# Patient Record
Sex: Male | Born: 1950 | Race: White | Hispanic: No | Marital: Married | Smoking: Former smoker
Health system: Southern US, Community
[De-identification: ages and names within clinical notes are randomized; demographics above are authoritative.]

## PROBLEM LIST (undated history)

## (undated) DIAGNOSIS — I447 Left bundle-branch block, unspecified: Secondary | ICD-10-CM

## (undated) DIAGNOSIS — J61 Pneumoconiosis due to asbestos and other mineral fibers: Secondary | ICD-10-CM

## (undated) DIAGNOSIS — E7800 Pure hypercholesterolemia, unspecified: Secondary | ICD-10-CM

## (undated) DIAGNOSIS — IMO0002 Reserved for concepts with insufficient information to code with codable children: Secondary | ICD-10-CM

## (undated) DIAGNOSIS — I10 Essential (primary) hypertension: Secondary | ICD-10-CM

## (undated) DIAGNOSIS — R0602 Shortness of breath: Secondary | ICD-10-CM

## (undated) DIAGNOSIS — K219 Gastro-esophageal reflux disease without esophagitis: Secondary | ICD-10-CM

## (undated) DIAGNOSIS — M1710 Unilateral primary osteoarthritis, unspecified knee: Secondary | ICD-10-CM

## (undated) DIAGNOSIS — R0609 Other forms of dyspnea: Secondary | ICD-10-CM

## (undated) DIAGNOSIS — Z77090 Contact with and (suspected) exposure to asbestos: Secondary | ICD-10-CM

## (undated) DIAGNOSIS — M1990 Unspecified osteoarthritis, unspecified site: Secondary | ICD-10-CM

## (undated) DIAGNOSIS — C4491 Basal cell carcinoma of skin, unspecified: Secondary | ICD-10-CM

## (undated) DIAGNOSIS — I5043 Acute on chronic combined systolic (congestive) and diastolic (congestive) heart failure: Secondary | ICD-10-CM

## (undated) DIAGNOSIS — I509 Heart failure, unspecified: Secondary | ICD-10-CM

## (undated) HISTORY — PX: JOINT REPLACEMENT: SHX530

## (undated) HISTORY — DX: Left bundle-branch block, unspecified: I44.7

## (undated) HISTORY — DX: Heart failure, unspecified: I50.9

## (undated) HISTORY — DX: Acute on chronic combined systolic (congestive) and diastolic (congestive) heart failure: I50.43

## (undated) HISTORY — DX: Other forms of dyspnea: R06.09

## (undated) HISTORY — PX: COLONOSCOPY: SHX174

## (undated) HISTORY — DX: Contact with and (suspected) exposure to asbestos: Z77.090

---

## 2003-06-04 ENCOUNTER — Encounter: Payer: Self-pay | Admitting: Emergency Medicine

## 2006-07-01 ENCOUNTER — Encounter (INDEPENDENT_AMBULATORY_CARE_PROVIDER_SITE_OTHER): Payer: Self-pay | Admitting: Specialist

## 2006-07-01 ENCOUNTER — Ambulatory Visit (HOSPITAL_COMMUNITY): Admission: RE | Admit: 2006-07-01 | Discharge: 2006-07-01 | Payer: Self-pay | Admitting: Gastroenterology

## 2006-07-01 ENCOUNTER — Ambulatory Visit: Payer: Self-pay | Admitting: Gastroenterology

## 2007-08-15 ENCOUNTER — Encounter: Payer: Self-pay | Admitting: Emergency Medicine

## 2007-10-19 HISTORY — PX: FINGER AMPUTATION: SHX636

## 2009-03-13 ENCOUNTER — Encounter: Payer: Self-pay | Admitting: Emergency Medicine

## 2009-05-14 ENCOUNTER — Encounter: Payer: Self-pay | Admitting: Emergency Medicine

## 2010-03-26 ENCOUNTER — Ambulatory Visit: Payer: Self-pay | Admitting: Emergency Medicine

## 2010-03-26 ENCOUNTER — Telehealth (INDEPENDENT_AMBULATORY_CARE_PROVIDER_SITE_OTHER): Payer: Self-pay | Admitting: *Deleted

## 2010-03-26 DIAGNOSIS — Z77090 Contact with and (suspected) exposure to asbestos: Secondary | ICD-10-CM | POA: Insufficient documentation

## 2010-03-26 DIAGNOSIS — Z87891 Personal history of nicotine dependence: Secondary | ICD-10-CM

## 2010-03-31 ENCOUNTER — Encounter: Payer: Self-pay | Admitting: Emergency Medicine

## 2010-03-31 ENCOUNTER — Ambulatory Visit (HOSPITAL_COMMUNITY): Admission: RE | Admit: 2010-03-31 | Discharge: 2010-03-31 | Payer: Self-pay | Admitting: Emergency Medicine

## 2010-04-09 ENCOUNTER — Ambulatory Visit: Payer: Self-pay | Admitting: Emergency Medicine

## 2010-04-09 DIAGNOSIS — Z77090 Contact with and (suspected) exposure to asbestos: Secondary | ICD-10-CM

## 2010-04-09 DIAGNOSIS — J309 Allergic rhinitis, unspecified: Secondary | ICD-10-CM

## 2010-11-08 ENCOUNTER — Encounter: Payer: Self-pay | Admitting: Emergency Medicine

## 2010-11-17 NOTE — Progress Notes (Signed)
Summary: returning a call to lori/cb  Phone Note Call from Patient Call back at [facility] Phone [phone]   Caller: Patient Call For: byrum Summary of Call: [facility] [employer] number [phone] fax      [phone] phone Initial call taken by: Lacinda Axon,  March 26, 2010 2:37 PM  Follow-up for Phone Call        lori gave papers for me to fax to [facility] [employer] ---done and papers returned to [facility] for dr byrum Follow-up by: Philipp Deputy CMA,  March 26, 2010 3:50 PM

## 2010-11-17 NOTE — Letter (Signed)
Summary: [id number] records  1982-2008 records   Imported By: Lester Washburn 05/29/2010 09:25:16  _____________________________________________________________________  External Attachment:    Type:   Image     Comment:   External Document

## 2010-11-17 NOTE — Assessment & Plan Note (Signed)
Summary: asbestos, abnormal CT scan   Visit Type:  Follow-up Copy to:  Self Referred Primary Provider/Referring Provider:  Dr. Nicanor Bake  CC:  Patient here for follow-up to discuss PFT results. No complaints today.[name] Kitchen  History of Present Illness: 60 yo man, former tobacco, has worked for [employer] with documented asbestos exposure. Was discovered to have an abnormal CXR, around 2004. No other symptoms at that time. Over time he has developed some exertional dyspnea. Has never had a CT scan of the chest. He has done spirometry and CXR's   ROV 04/09/10 -- follows up to today to review Ct scan and PFT in setting of asbestos exposure. Other ROS - mentions sinus pain/pressure, nasal congestion, clear drainage.   [place]  Current Medications (verified): 1)  Ibuprofen 200 Mg Tabs (Ibuprofen) .... As Needed 2)  Fish Oil 500 Mg Caps (Omega-3 Fatty Acids) .... Take 1 Capsule By Mouth Once A Day 3)  Multivitamins  Tabs (Multiple Vitamin) .... Take 1 Tablet By Mouth Once A Day 4)  Glucosamine Chondr 1500 Complx  Caps (Glucosamine-Chondroit-Vit C-Mn) .... 2 Caps Once Daily  Allergies (verified): 1)  Pcn  Vital Signs:  Patient profile:   60 year old male Height:      73.5 inches (186.69 cm) Weight:      246 pounds (111.82 kg) BMI:     32.13 O2 Sat:      92 % on Room air Temp:     97.8 degrees F (36.56 degrees C) oral Pulse rate:   86 / minute BP sitting:   116 / 80  (left arm) Cuff size:   regular  Vitals Entered By: Michel Bickers CMA (April 09, 2010 3:50 PM)  O2 Sat at Rest %:  92 O2 Flow:  Room air CC: Patient here for follow-up to discuss PFT results. No complaints today. Comments Medications reviewed. Daytime phone verified. Michel Bickers [facility]  April 09, 2010 4:03 PM   Physical Exam  General:  normal appearance and healthy appearing.   Head:  normocephalic and atraumatic Nose:  no deformity, discharge, inflammation, or lesions Mouth:  no deformity or lesions Neck:  no masses,  thyromegaly, or abnormal cervical nodes Lungs:  clear bilaterally to auscultation Heart:  regular rate and rhythm, S1, S2 without murmurs, rubs, gallops, or clicks Abdomen:  not examined Msk:  no deformity or scoliosis noted with normal posture Extremities:  no clubbing, cyanosis, edema, or deformity noted Neurologic:  non-focal Skin:  intact without lesions or rashes Psych:  alert and cooperative; normal mood and affect; normal attention span and concentration   Pulmonary Function Test Date: 03/31/2010 Height (in.): 73.50 Gender: Male  Pre-Spirometry FVC    Value: 4.98 L/min   Pred: 5.38 L/min     % Pred: 92 % FEV1    Value: 3.68 L     Pred: 4.07 L     % Pred: 90 % FEV1/FVC  Value: 74 %     Pred: 76 %     % Pred: 97 % FEF 25-75  Value: 2.66 L/min   Pred: 3.32 L/min     % Pred: 80 %  Post-Spirometry FVC    Value: 5.02 L/min   Pred: 4.98 L/min     % Pred: 93 % FEV1    Value: 3.77 L     Pred: 3.68 L     % Pred: 92 % FEV1/FVC  Value: 75 %     Pred: 74 %     % Pred:  98 % FEF 25-75  Value: 2.82 L/min   Pred: 2.66 L/min     % Pred: 84 %  Lung Volumes TLC    Value: 8.02 L   % Pred: 104 % RV    Value: 2.92 L   % Pred: 119 % DLCO    Value: 32.23 %   % Pred: 86 % DLCO/VA  Value: 4.27 %   % Pred: 88 %  Impression & Recommendations:  Problem # 1:  ASBESTOS EXPOSURE, 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  - will need to follow imaging and PFT monthly  Problem # 2:  CT, CHEST, ABNORMAL (ICD-793.1) Nodular disease noted, non-specific finding but will need to be followed.  - repeat CT annually  Problem # 3:  ALLERGIC RHINITIS (ICD-477.9)  Other Orders: Est. Patient Level IV (69629)  Patient Instructions: 1)  Your CT scan shows some mild nodular changes. These should be followed with a scan in a year to insure that they are not changing. 2)  Your lung function testing is stable compared with your last testing in 2008. The lung capacity and  airflows are normal.  3)  Try using your claritin every day when you are having nasal obstruction, congestion and drainage.  4)  You may also try OTC decongestants that contain either chlorphenerimine or bromphenerimine.  5)  Try using veramyst 2 sprays each nostril once daily for nasal congestion.  6)  Follow up with Dr Delton Coombes in 1 year or sooner if you have any problems.

## 2010-11-17 NOTE — Assessment & Plan Note (Signed)
Summary: asbestos exposure   Visit Type:  Initial Consult Copy to:  Self Referred Primary Provider/Referring Provider:  Dr. Nicanor Bake  CC:  Pulmonary Consult for asbestosis exposure and abnormal CXR.  History of Present Illness: 60 yo man, former tobacco, has worked for [employer] with documented asbestos exposure. Was discovered to have an abnormal CXR, around 2004. No other symptoms at that time. Over time he has developed some exertional dyspnea. Has never had a CT scan of the chest. He has done spirometry and CXR's   Preventive Screening-Counseling & Management  Alcohol-Tobacco     Smoking Status: quit  Current Medications (verified): 1)  Ibuprofen 200 Mg Tabs (Ibuprofen) .... As Needed 2)  Fish Oil 500 Mg Caps (Omega-3 Fatty Acids) .... Take 1 Capsule By Mouth Once A Day 3)  Multivitamins  Tabs (Multiple Vitamin) .... Take 1 Tablet By Mouth Once A Day 4)  Glucosamine Chondr 1500 Complx  Caps (Glucosamine-Chondroit-Vit C-Mn) .... 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  Quit 1987. 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  The patient denies shortness of breath with activity, shortness of breath at rest, productive cough, non-productive cough, coughing up blood, chest pain, irregular heartbeats, acid heartburn, indigestion, loss of appetite, weight change, abdominal pain, difficulty swallowing, sore throat, tooth/dental problems, headaches, nasal congestion/difficulty breathing through nose, sneezing,  itching, ear ache, anxiety, depression, hand/feet swelling, rash, change in color of mucus, and fever.    Vital Signs:  Patient profile:   60 year old male Height:      74 inches Weight:      242 pounds BMI:     31.18 O2 Sat:      91 % on Room air Temp:     98.3 degrees F oral Pulse rate:   90 / minute BP sitting:   118 / 80  (left arm) Cuff size:   regular  Vitals Entered By: Gweneth Dimitri RN (March 26, 2010 11:26 AM)  O2 Flow:  Room air CC: Pulmonary Consult for asbestosis exposure and abnormal CXR Comments Medications reviewed with patient Daytime contact number verified with patient. Gweneth Dimitri RN  March 26, 2010 11:26 AM    Physical Exam  General:  normal appearance and healthy appearing.     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  - high res CT - full PFT - obtain records from prior evals for comparison - ROV to review. - will need serial exams and CXR's  Problem # 2:  TOBACCO ABUSE, HX OF (ICD-V15.82)  Medications Added to Medication List This Visit: 1)  Ibuprofen 200 Mg Tabs (Ibuprofen) .... As needed 2)  Fish Oil 500 Mg Caps (Omega-3 fatty acids) .... Take 1 capsule by mouth once a day 3)  Multivitamins Tabs (Multiple vitamin) .... Take 1 tablet by mouth once a day 4)  Glucosamine Chondr 1500 Complx Caps (Glucosamine-chondroit-vit c-mn) .... 2 caps once daily  Other Orders: Consultation Level IV (16109)  Radiology Referral (Radiology)  Patient Instructions: 1)  We will perform a high resolution CT scan of the chest 2)  We will perform full pulmonary function testing 3)  We will obtain copies of your prior imaging and PFT's 4)  Follow up with Dr Delton Coombes after your testing is completed to review the results.   Appended Document: asbestos exposure Will review with pt at his ROV. RSB

## 2010-11-17 NOTE — Letter (Signed)
Summary: [facility]   Imported By: Sherian Rein 05/01/2010 07:54:47  _____________________________________________________________________  External Attachment:    Type:   Image     Comment:   External Document

## 2011-03-05 NOTE — Op Note (Signed)
NAME:  Franklin Jones, Franklin Jones NO.:  [id number]   MEDICAL RECORD NO.:  [id number]          PATIENT TYPE:  AMB   LOCATION:  DAY                           FACILITY:  [facility]   PHYSICIAN:  Kassie Mends, M.D.      DATE OF BIRTH:  [birth year]   DATE OF PROCEDURE:  07/01/2006  DATE OF DISCHARGE:  07/01/2006                                 OPERATIVE REPORT   DATE OF PROCEDURE:  07/01/2006   PROCEDURE:  Colonoscopy with cold forceps polypectomy.   FINDINGS:  1. 4-mm rectal polyp removed via cold forceps.  2. Rare sigmoid diverticulosis and diverticula; otherwise no masses,      inflammatory changes or vascular ectasia seen.  No internal      hemorrhoids.   RECOMMENDATIONS:  1. High fiber diet.  Handout given on polyps, diverticulosis, and high      fiber diet.  2. Follow-up biopsies.  If adenomatous, then next screening colonoscopy in      5 years.  3. Follow with Dr. Doreen Beam.   MEDICATIONS:  1. Demerol 50 mg IV.  2. Versed 4 mg IV.   PROCEDURE TECHNIQUE:  Physical exam was performed and informed consent was  obtained from the patient explaining all the benefits, risks and  alternatives to the procedure.  The patient was connected to the monitor and  placed in the left lateral position.  Continuous oxygen was provided by  nasal cannula IV medicine administered through an indwelling cannula.  After  administration of sedation and rectal exam, the patient's rectum was  intubated.  The scope was advanced under direct visualization to the cecum.  The scope was subsequently removed slowly by carefully examine the color,  texture, anatomy integrity of mucosa on the way out.  The patient was  recovered in the endoscopy suite and discharged home in satisfactory  condition.      Kassie Mends, M.D.  Electronically Signed    SM/MEDQ  D:  07/01/2006  T:  07/03/2006  Job:  [id number]   cc:   Doreen Beam  Fax: [phone]

## 2011-04-05 ENCOUNTER — Telehealth: Payer: Self-pay | Admitting: Emergency Medicine

## 2011-04-05 DIAGNOSIS — Z77090 Contact with and (suspected) exposure to asbestos: Secondary | ICD-10-CM

## 2011-04-05 NOTE — Telephone Encounter (Signed)
Spoke with pt. He is asking to go ahead and have CT Chest and PFT sched since he is due for these before his rov sched for 04/22/11. I looked back and per last ov note he was to have CT Chest done annually, but I am unsure about the PFT. RB, pls advise if both tests are needed and if CT is w/ or w/o contrast. Thanks!

## 2011-04-06 NOTE — Telephone Encounter (Signed)
He needs spirometry only, needs to be done annually. I will order now and he can get it before our ROV.

## 2011-04-06 NOTE — Telephone Encounter (Signed)
Addended by: Leslye Peer on: 04/06/2011 09:56 AM   Modules accepted: Orders

## 2011-04-07 ENCOUNTER — Telehealth: Payer: Self-pay | Admitting: Emergency Medicine

## 2011-04-07 NOTE — Telephone Encounter (Signed)
[id number]

## 2011-04-07 NOTE — Telephone Encounter (Signed)
This was signed by accident.  Will hold in triage until pt returns call.

## 2011-04-22 ENCOUNTER — Ambulatory Visit: Payer: Self-pay | Admitting: Emergency Medicine

## 2011-04-22 ENCOUNTER — Telehealth: Payer: Self-pay | Admitting: Emergency Medicine

## 2011-04-22 DIAGNOSIS — Z77090 Contact with and (suspected) exposure to asbestos: Secondary | ICD-10-CM

## 2011-04-22 DIAGNOSIS — R930 Abnormal findings on diagnostic imaging of skull and head, not elsewhere classified: Secondary | ICD-10-CM

## 2011-04-23 NOTE — Telephone Encounter (Signed)
[id number]

## 2011-04-26 NOTE — Telephone Encounter (Signed)
I spoke with the pt and he states that he is supposed to have a CT scan repeated this year. He states he has an appt this Wed. At 4pm. He states he could do the CT on Wed. Per last OV note RB wanted to repaet CT in 1 year. Order placed and sent to [facility]. Carron Curie, CMA

## 2011-04-26 NOTE — Telephone Encounter (Signed)
PATIENT CALLED BACK AND WAITED ON HOLD FOR SOME TIME.  PLEASE CALL BACK AT [phone] AND HAVE HIM PAGED.

## 2011-04-28 ENCOUNTER — Ambulatory Visit (INDEPENDENT_AMBULATORY_CARE_PROVIDER_SITE_OTHER): Payer: Worker's Compensation | Admitting: Emergency Medicine

## 2011-04-28 ENCOUNTER — Ambulatory Visit (INDEPENDENT_AMBULATORY_CARE_PROVIDER_SITE_OTHER)
Admission: RE | Admit: 2011-04-28 | Discharge: 2011-04-28 | Disposition: A | Discharge status: Home / Self Care | Payer: Self-pay | Source: Ambulatory Visit | Attending: Emergency Medicine | Admitting: Emergency Medicine

## 2011-04-28 ENCOUNTER — Encounter: Payer: Self-pay | Admitting: Emergency Medicine

## 2011-04-28 VITALS — BP 104/70 | HR 94 | Temp 98.1°F | Ht 74.0 in | Wt 251.0 lb

## 2011-04-28 DIAGNOSIS — Z77090 Contact with and (suspected) exposure to asbestos: Secondary | ICD-10-CM

## 2011-04-28 DIAGNOSIS — R930 Abnormal findings on diagnostic imaging of skull and head, not elsewhere classified: Secondary | ICD-10-CM

## 2011-04-28 LAB — PULMONARY FUNCTION TEST

## 2011-04-28 NOTE — Progress Notes (Signed)
60 yo man, former tobacco, has worked for [employer] with documented asbestos exposure. Was discovered to have an abnormal CXR, around 2004. No other symptoms at that time. Over time he has developed some exertional dyspnea. Has never had a CT scan of the chest. He has done spirometry and CXR's   ROV 04/09/10 -- follows up to today to review Ct scan and PFT in setting of asbestos exposure. Other ROS - mentions sinus pain/pressure, nasal congestion, clear drainage.   ROV 04/28/11 -- follow up visit, hx asbestos exposure. Had PFT and CT scan today. 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 PFT as below. He has noticed a slight decrease in functional capacity - related this to knee pain and decreased activity level, decreased conditioning.    Gen: Pleasant, well-nourished, in no distress,  normal affect  ENT: No lesions,  mouth clear,  oropharynx clear, no postnasal drip  Neck: No JVD, no TMG, no carotid bruits  Lungs: No use of accessory muscles, no dullness to percussion, clear without rales or rhonchi  Cardiovascular: RRR, heart sounds normal, no murmur or gallops, no peripheral edema  Musculoskeletal: No deformities, no cyanosis or clubbing  Neuro: alert, non focal  Skin: Warm, no lesions or rashes   PULMONARY FUNCTON TEST 04/09/2010 04/28/2011  FVC 4.98 4.51  FEV1 3.68 3.2  FEV1/FVC 73.9 71  FVC  % Predicted 92 87  FEV % Predicted 90 88  FeF 25-75 2.66 2.01  FeF 25-75 % Predicted 3.32 3.29    CT CHEST WITHOUT CONTRAST  04/26/11  Technique: Multidetector CT imaging of the chest was performed  following the standard protocol without IV contrast.  Comparison: 03/31/2010.  Findings: No pathologically enlarged mediastinal or axillary lymph  nodes. Hilar regions are difficult to definitively evaluate  without IV contrast. Coronary artery calcification. Heart size  normal. No pericardial effusion.  No pleural plaques. Subpleural scarring is seen in the medial  right lower lobe,  adjacent to prominent osteophytes in the thoracic  spine. Subpleural lymph node along the minor fissure. Scattered  tiny pulmonary nodules measure 4 mm or less in size and are  unchanged in the 62-month interval from 03/31/2010. No known  subpleural reticulation or honeycombing. No pleural fluid. Airway  is unremarkable.  Incidental imaging of the upper abdomen shows a 12 mm low  attenuation lesion in the left hepatic lobe, stable. No worrisome  lytic or sclerotic lesions.  IMPRESSION:  1. No evidence of asbestos related pleural disease.  2. Scattered pulmonary nodules measure 4 mm or less in size, are  unchanged in a 78-month interval from 03/31/2010, and are therefore  considered benign.  ASSESSMENT/PLAN: ASBESTOS EXPOSURE, HX OF Has some micronodular disease, mild bronchiectatic change but no plaques.PFT are in normal range but there is a drop in FEV1 and FVC today compared with 2011. ? A significant change. He feels well - will trend the PFT's again in a year or sooner if he clinically changes. CXR next visit.

## 2011-04-28 NOTE — Assessment & Plan Note (Signed)
Has some micronodular disease, mild bronchiectatic change but no plaques.PFT are in normal range but there is a drop in FEV1 and FVC today compared with 2011. ? A significant change. He feels well - will trend the PFT's again in a year or sooner if he clinically changes. CXR next visit.

## 2011-04-28 NOTE — Progress Notes (Signed)
PFT done today. 

## 2011-04-28 NOTE — Patient Instructions (Signed)
We will repeat your pulmonary function testing in a year.  We will perform a CXR in a year.  Follow up with Dr Delton Coombes in a year to review your testing. If you have any problems with your breathing please follow up sooner.

## 2011-10-27 ENCOUNTER — Other Ambulatory Visit (HOSPITAL_COMMUNITY): Payer: Self-pay | Admitting: Orthopaedic Surgery

## 2011-11-08 ENCOUNTER — Encounter (HOSPITAL_COMMUNITY): Payer: Self-pay | Admitting: Pharmacy Technician

## 2011-11-17 ENCOUNTER — Encounter (HOSPITAL_COMMUNITY)
Admission: RE | Admit: 2011-11-17 | Discharge: 2011-11-17 | Disposition: A | Discharge status: Home / Self Care | Payer: 59 | Source: Ambulatory Visit | Attending: Orthopaedic Surgery | Admitting: Orthopaedic Surgery

## 2011-11-17 ENCOUNTER — Encounter (HOSPITAL_COMMUNITY): Payer: Self-pay

## 2011-11-17 ENCOUNTER — Ambulatory Visit (HOSPITAL_COMMUNITY)
Admission: RE | Admit: 2011-11-17 | Discharge: 2011-11-17 | Disposition: A | Discharge status: Home / Self Care | Payer: 59 | Source: Ambulatory Visit | Attending: Orthopaedic Surgery | Admitting: Orthopaedic Surgery

## 2011-11-17 ENCOUNTER — Other Ambulatory Visit: Payer: Self-pay

## 2011-11-17 DIAGNOSIS — M47814 Spondylosis without myelopathy or radiculopathy, thoracic region: Secondary | ICD-10-CM | POA: Insufficient documentation

## 2011-11-17 DIAGNOSIS — Z01818 Encounter for other preprocedural examination: Secondary | ICD-10-CM | POA: Insufficient documentation

## 2011-11-17 DIAGNOSIS — Z01812 Encounter for preprocedural laboratory examination: Secondary | ICD-10-CM | POA: Insufficient documentation

## 2011-11-17 HISTORY — DX: Unspecified osteoarthritis, unspecified site: M19.90

## 2011-11-17 LAB — CBC
Hemoglobin: 14.5 g/dL (ref 13.0–17.0)
MCH: 30.5 pg (ref 26.0–34.0)
MCV: 88.7 fL (ref 78.0–100.0)
Platelets: 217 10*3/uL (ref 150–400)
WBC: 7 10*3/uL (ref 4.0–10.5)

## 2011-11-17 LAB — BASIC METABOLIC PANEL
BUN: 24 mg/dL — ABNORMAL HIGH (ref 6–23)
Calcium: 9.9 mg/dL (ref 8.4–10.5)
Chloride: 101 mEq/L (ref 96–112)
GFR calc non Af Amer: 56 mL/min — ABNORMAL LOW (ref >90)
Glucose, Bld: 77 mg/dL (ref 70–99)
Sodium: 135 mEq/L (ref 135–145)

## 2011-11-17 LAB — PROTIME-INR: INR: 0.97 (ref 0.00–1.49)

## 2011-11-17 LAB — URINALYSIS, ROUTINE W REFLEX MICROSCOPIC
Nitrite: NEGATIVE
Protein, ur: NEGATIVE mg/dL
Specific Gravity, Urine: 1.027 (ref 1.005–1.030)
Urobilinogen, UA: 0.2 mg/dL (ref 0.0–1.0)
pH: 5.5 (ref 5.0–8.0)

## 2011-11-17 LAB — APTT: aPTT: 33 seconds (ref 24–37)

## 2011-11-17 NOTE — Patient Instructions (Signed)
20 [name]  11/17/2011   Your procedure is scheduled on: 11/19/11   Friday 0930-1100  Report to [facility] at 0700 AM.  Call this number if you have problems the morning of surgery: [phone]   [facility]  PST [phone]   Remember:   Do not eat food:After Midnight.Thursday night  May have clear liquids:until Midnight .Thursday night  Clear liquids include soda, tea, black coffee, apple or grape juice, broth.  Take these medicines the morning of surgery with A SIP OF WATER:none  Do not wear jewelry, make-up or nail polish.  Do not wear lotions, powders, or perfumes. You may wear deodorant.  Do not shave 48 hours prior to surgery.  Do not bring valuables to the hospital.  Contacts, dentures or bridgework may not be worn into surgery.  Leave suitcase in the car. After surgery it may be brought to your room.  For patients admitted to the hospital, checkout time is 11:00 AM the day of discharge.   Patients discharged the day of surgery will not be allowed to drive home.  Name and phone number of your driver: wife  Special Instructions: CHG Shower Use Special Wash: 1/2 bottle night before surgery and 1/2 bottle morning of surgery. Regular soap face and privates   Please read over the following fact sheets that you were given: MRSA Information

## 2011-11-17 NOTE — Pre-Procedure Instructions (Signed)
Faxed note to Dr Magnus Ivan regarding "cold" and has been on antibiotics with confirmation. Faxed request to Dr Magnus Ivan for review abnormal BUN and urinalysis with confirmation

## 2011-11-19 ENCOUNTER — Encounter (HOSPITAL_COMMUNITY): Payer: Self-pay | Admitting: Anesthesiology

## 2011-11-19 ENCOUNTER — Inpatient Hospital Stay (HOSPITAL_COMMUNITY)
Admission: RE | Admit: 2011-11-19 | Discharge: 2011-11-22 | DRG: 470 | Disposition: A | Discharge status: Home Health | Payer: 59 | Source: Ambulatory Visit | Attending: Orthopaedic Surgery | Admitting: Orthopaedic Surgery

## 2011-11-19 ENCOUNTER — Inpatient Hospital Stay (HOSPITAL_COMMUNITY): Payer: 59 | Admitting: Anesthesiology

## 2011-11-19 ENCOUNTER — Inpatient Hospital Stay (HOSPITAL_COMMUNITY): Payer: 59

## 2011-11-19 ENCOUNTER — Encounter (HOSPITAL_COMMUNITY)
Admission: RE | Disposition: A | Discharge status: Home Health | Payer: Self-pay | Source: Ambulatory Visit | Attending: Orthopaedic Surgery

## 2011-11-19 ENCOUNTER — Encounter (HOSPITAL_COMMUNITY): Payer: Self-pay | Admitting: *Deleted

## 2011-11-19 DIAGNOSIS — M1710 Unilateral primary osteoarthritis, unspecified knee: Principal | ICD-10-CM | POA: Diagnosis present

## 2011-11-19 DIAGNOSIS — M179 Osteoarthritis of knee, unspecified: Secondary | ICD-10-CM

## 2011-11-19 HISTORY — DX: Unilateral primary osteoarthritis, unspecified knee: M17.10

## 2011-11-19 HISTORY — PX: KNEE ARTHROPLASTY: SHX992

## 2011-11-19 HISTORY — DX: Osteoarthritis of knee, unspecified: M17.9

## 2011-11-19 LAB — TYPE AND SCREEN

## 2011-11-19 SURGERY — ARTHROPLASTY, KNEE, TOTAL, USING IMAGELESS COMPUTER-ASSISTED NAVIGATION
Anesthesia: General | Site: Knee | Laterality: Right | Wound class: Clean

## 2011-11-19 MED ORDER — MENTHOL 3 MG MT LOZG
1.0000 | LOZENGE | OROMUCOSAL | Status: DC | PRN
  Filled 2011-11-19: qty 9

## 2011-11-19 MED ORDER — FENTANYL CITRATE 0.05 MG/ML IJ SOLN
50.0000 ug | INTRAMUSCULAR | Status: DC | PRN
  Administered 2011-11-19: 100 ug via INTRAVENOUS

## 2011-11-19 MED ORDER — CLINDAMYCIN PHOSPHATE 600 MG/50ML IV SOLN
600.0000 mg | INTRAVENOUS | Status: AC
  Administered 2011-11-19: 600 mg via INTRAVENOUS

## 2011-11-19 MED ORDER — ROPIVACAINE HCL 5 MG/ML IJ SOLN
INTRAMUSCULAR | Status: DC | PRN
  Administered 2011-11-19: 30 mL

## 2011-11-19 MED ORDER — HYDROMORPHONE HCL PF 1 MG/ML IJ SOLN
INTRAMUSCULAR | Status: DC | PRN
  Administered 2011-11-19: 1 mg via INTRAVENOUS
  Administered 2011-11-19 (×2): 0.5 mg via INTRAVENOUS

## 2011-11-19 MED ORDER — ACETAMINOPHEN 325 MG PO TABS: 650.0000 mg | ORAL_TABLET | Freq: Four times a day (QID) | ORAL | Status: DC | PRN

## 2011-11-19 MED ORDER — DOCUSATE SODIUM 100 MG PO CAPS
100.0000 mg | ORAL_CAPSULE | Freq: Two times a day (BID) | ORAL | Status: DC
  Administered 2011-11-20 – 2011-11-22 (×5): 100 mg via ORAL
  Filled 2011-11-19 (×7): qty 1

## 2011-11-19 MED ORDER — DIPHENHYDRAMINE HCL 12.5 MG/5ML PO ELIX
12.5000 mg | ORAL_SOLUTION | Freq: Four times a day (QID) | ORAL | Status: DC | PRN
  Filled 2011-11-19: qty 5

## 2011-11-19 MED ORDER — FERROUS SULFATE 325 (65 FE) MG PO TABS
325.0000 mg | ORAL_TABLET | Freq: Three times a day (TID) | ORAL | Status: DC
  Administered 2011-11-20 – 2011-11-22 (×7): 325 mg via ORAL
  Filled 2011-11-19 (×10): qty 1

## 2011-11-19 MED ORDER — SODIUM CHLORIDE 0.9 % IJ SOLN: 9.0000 mL | INTRAMUSCULAR | Status: DC | PRN

## 2011-11-19 MED ORDER — ACETAMINOPHEN 650 MG RE SUPP: 650.0000 mg | Freq: Four times a day (QID) | RECTAL | Status: DC | PRN

## 2011-11-19 MED ORDER — NEOSTIGMINE METHYLSULFATE 1 MG/ML IJ SOLN
INTRAMUSCULAR | Status: DC | PRN
  Administered 2011-11-19: 5 mg via INTRAVENOUS

## 2011-11-19 MED ORDER — ONDANSETRON HCL 4 MG PO TABS: 4.0000 mg | ORAL_TABLET | Freq: Four times a day (QID) | ORAL | Status: DC | PRN

## 2011-11-19 MED ORDER — DIPHENHYDRAMINE HCL 12.5 MG/5ML PO ELIX: 12.5000 mg | ORAL_SOLUTION | ORAL | Status: DC | PRN

## 2011-11-19 MED ORDER — ADULT MULTIVITAMIN W/MINERALS CH
1.0000 | ORAL_TABLET | Freq: Every day | ORAL | Status: DC
  Administered 2011-11-20 – 2011-11-22 (×3): 1 via ORAL
  Filled 2011-11-19 (×4): qty 1

## 2011-11-19 MED ORDER — EPHEDRINE SULFATE 50 MG/ML IJ SOLN
INTRAMUSCULAR | Status: DC | PRN
  Administered 2011-11-19 (×3): 10 mg via INTRAVENOUS

## 2011-11-19 MED ORDER — METHOCARBAMOL 500 MG PO TABS
500.0000 mg | ORAL_TABLET | Freq: Four times a day (QID) | ORAL | Status: DC | PRN
Start: 2011-11-19 — End: 2011-11-22
  Administered 2011-11-19 – 2011-11-20 (×4): 500 mg via ORAL
  Filled 2011-11-19 (×4): qty 1

## 2011-11-19 MED ORDER — SODIUM CHLORIDE 0.9 % IV SOLN
INTRAVENOUS | Status: DC
  Administered 2011-11-19 – 2011-11-20 (×2): via INTRAVENOUS

## 2011-11-19 MED ORDER — METHOCARBAMOL 100 MG/ML IJ SOLN
500.0000 mg | Freq: Four times a day (QID) | INTRAMUSCULAR | Status: DC | PRN
  Administered 2011-11-19: 500 mg via INTRAVENOUS
  Filled 2011-11-19: qty 5

## 2011-11-19 MED ORDER — FENTANYL CITRATE 0.05 MG/ML IJ SOLN
INTRAMUSCULAR | Status: DC | PRN
  Administered 2011-11-19 (×4): 25 ug via INTRAVENOUS
  Administered 2011-11-19: 50 ug via INTRAVENOUS
  Administered 2011-11-19 (×2): 25 ug via INTRAVENOUS

## 2011-11-19 MED ORDER — RIVAROXABAN 10 MG PO TABS
10.0000 mg | ORAL_TABLET | Freq: Every day | ORAL | Status: DC
  Administered 2011-11-20 – 2011-11-22 (×3): 10 mg via ORAL
  Filled 2011-11-19 (×3): qty 1

## 2011-11-19 MED ORDER — MORPHINE SULFATE (PF) 1 MG/ML IV SOLN
INTRAVENOUS | Status: DC
  Administered 2011-11-19: 14:00:00 via INTRAVENOUS
  Administered 2011-11-19: 5 mg via INTRAVENOUS
  Administered 2011-11-19: 8 mg via INTRAVENOUS
  Administered 2011-11-20: 9 mg via INTRAVENOUS
  Administered 2011-11-20: 4 mg via INTRAVENOUS
  Administered 2011-11-20: 9 mg via INTRAVENOUS
  Administered 2011-11-20: 5 mg via INTRAVENOUS
  Administered 2011-11-20: 4 mg via INTRAVENOUS
  Administered 2011-11-20: 8 mg via INTRAVENOUS
  Administered 2011-11-21: 2 mg via INTRAVENOUS
  Administered 2011-11-21: via INTRAVENOUS
  Filled 2011-11-19 (×4): qty 25

## 2011-11-19 MED ORDER — LIDOCAINE HCL (CARDIAC) 20 MG/ML IV SOLN
INTRAVENOUS | Status: DC | PRN
  Administered 2011-11-19: 100 mg via INTRAVENOUS

## 2011-11-19 MED ORDER — NALOXONE HCL 0.4 MG/ML IJ SOLN: 0.4000 mg | INTRAMUSCULAR | Status: DC | PRN

## 2011-11-19 MED ORDER — ACETAMINOPHEN 10 MG/ML IV SOLN
INTRAVENOUS | Status: DC | PRN
  Administered 2011-11-19: 1000 mg via INTRAVENOUS

## 2011-11-19 MED ORDER — HYDROCODONE-ACETAMINOPHEN 5-325 MG PO TABS: 1.0000 | ORAL_TABLET | ORAL | Status: DC | PRN

## 2011-11-19 MED ORDER — ONDANSETRON HCL 4 MG/2ML IJ SOLN: 4.0000 mg | Freq: Four times a day (QID) | INTRAMUSCULAR | Status: DC | PRN

## 2011-11-19 MED ORDER — PROMETHAZINE HCL 25 MG/ML IJ SOLN: 6.2500 mg | INTRAMUSCULAR | Status: DC | PRN

## 2011-11-19 MED ORDER — LACTATED RINGERS IV SOLN: INTRAVENOUS | Status: DC

## 2011-11-19 MED ORDER — DEXAMETHASONE SODIUM PHOSPHATE 10 MG/ML IJ SOLN
INTRAMUSCULAR | Status: DC | PRN
  Administered 2011-11-19: 10 mg via INTRAVENOUS

## 2011-11-19 MED ORDER — ONDANSETRON HCL 4 MG/2ML IJ SOLN
INTRAMUSCULAR | Status: DC | PRN
  Administered 2011-11-19: 4 mg via INTRAVENOUS

## 2011-11-19 MED ORDER — ROCURONIUM BROMIDE 100 MG/10ML IV SOLN
INTRAVENOUS | Status: DC | PRN
  Administered 2011-11-19: 40 mg via INTRAVENOUS

## 2011-11-19 MED ORDER — DIPHENHYDRAMINE HCL 50 MG/ML IJ SOLN: 12.5000 mg | Freq: Four times a day (QID) | INTRAMUSCULAR | Status: DC | PRN

## 2011-11-19 MED ORDER — PROPOFOL 10 MG/ML IV EMUL
INTRAVENOUS | Status: DC | PRN
  Administered 2011-11-19: 150 mg via INTRAVENOUS
  Administered 2011-11-19: 5 mg via INTRAVENOUS

## 2011-11-19 MED ORDER — LACTATED RINGERS IV SOLN
INTRAVENOUS | Status: DC
  Administered 2011-11-19: 1000 mL via INTRAVENOUS

## 2011-11-19 MED ORDER — LORATADINE 10 MG PO TABS
10.0000 mg | ORAL_TABLET | Freq: Every day | ORAL | Status: DC
  Administered 2011-11-20 – 2011-11-22 (×3): 10 mg via ORAL
  Filled 2011-11-19 (×4): qty 1

## 2011-11-19 MED ORDER — MORPHINE SULFATE 2 MG/ML IJ SOLN: 1.0000 mg | INTRAMUSCULAR | Status: DC | PRN

## 2011-11-19 MED ORDER — METOCLOPRAMIDE HCL 10 MG PO TABS: 5.0000 mg | ORAL_TABLET | Freq: Three times a day (TID) | ORAL | Status: DC | PRN

## 2011-11-19 MED ORDER — METOCLOPRAMIDE HCL 5 MG/ML IJ SOLN: 5.0000 mg | Freq: Three times a day (TID) | INTRAMUSCULAR | Status: DC | PRN

## 2011-11-19 MED ORDER — ALUM & MAG HYDROXIDE-SIMETH 200-200-20 MG/5ML PO SUSP: 30.0000 mL | ORAL | Status: DC | PRN

## 2011-11-19 MED ORDER — OXYCODONE HCL 5 MG PO TABS
5.0000 mg | ORAL_TABLET | ORAL | Status: DC | PRN
  Administered 2011-11-20: 5 mg via ORAL
  Administered 2011-11-20 – 2011-11-22 (×8): 10 mg via ORAL
  Filled 2011-11-19 (×8): qty 2
  Filled 2011-11-19: qty 3

## 2011-11-19 MED ORDER — ZOLPIDEM TARTRATE 5 MG PO TABS: 5.0000 mg | ORAL_TABLET | Freq: Every evening | ORAL | Status: DC | PRN

## 2011-11-19 MED ORDER — CLINDAMYCIN PHOSPHATE 600 MG/50ML IV SOLN
600.0000 mg | Freq: Four times a day (QID) | INTRAVENOUS | Status: AC
  Administered 2011-11-19 – 2011-11-20 (×3): 600 mg via INTRAVENOUS
  Filled 2011-11-19 (×3): qty 50

## 2011-11-19 MED ORDER — MIDAZOLAM HCL 10 MG/2ML IJ SOLN
1.0000 mg | INTRAMUSCULAR | Status: DC | PRN
  Administered 2011-11-19: 2 mg via INTRAVENOUS

## 2011-11-19 MED ORDER — HYDROMORPHONE HCL PF 1 MG/ML IJ SOLN
0.2500 mg | INTRAMUSCULAR | Status: DC | PRN
  Administered 2011-11-19: 0.5 mg via INTRAVENOUS

## 2011-11-19 MED ORDER — SODIUM CHLORIDE 0.9 % IR SOLN
Status: DC | PRN
  Administered 2011-11-19: 1000 mL

## 2011-11-19 MED ORDER — PHENOL 1.4 % MT LIQD
1.0000 | OROMUCOSAL | Status: DC | PRN
  Filled 2011-11-19: qty 177

## 2011-11-19 MED ORDER — GLYCOPYRROLATE 0.2 MG/ML IJ SOLN
INTRAMUSCULAR | Status: DC | PRN
Start: 2011-11-19 — End: 2011-11-19
  Administered 2011-11-19: .8 mg via INTRAVENOUS

## 2011-11-19 SURGICAL SUPPLY — 68 items
BAG SPEC THK2 15X12 ZIP CLS (MISCELLANEOUS) ×1
BAG ZIPLOCK 12X15 (MISCELLANEOUS) ×2 IMPLANT
BANDAGE ELASTIC 6 VELCRO ST LF (GAUZE/BANDAGES/DRESSINGS) ×2 IMPLANT
BANDAGE ESMARK 6X9 LF (GAUZE/BANDAGES/DRESSINGS) ×1 IMPLANT
BLADE SAG 18X100X1.27 (BLADE) ×2 IMPLANT
BLADE SAW SGTL 13.0X1.19X90.0M (BLADE) ×2 IMPLANT
BLADE SURG SZ11 CARB STEEL (BLADE) ×2 IMPLANT
BNDG CMPR 9X6 STRL LF SNTH (GAUZE/BANDAGES/DRESSINGS) ×1
BNDG COHESIVE 6X5 TAN STRL LF (GAUZE/BANDAGES/DRESSINGS) ×4 IMPLANT
BNDG ESMARK 6X9 LF (GAUZE/BANDAGES/DRESSINGS) ×2
BOWL SMART MIX CTS (DISPOSABLE) ×2 IMPLANT
CEMENT HV SMART SET (Cement) ×4 IMPLANT
CLOTH BEACON ORANGE TIMEOUT ST (SAFETY) ×2 IMPLANT
COVER SURGICAL LIGHT HANDLE (MISCELLANEOUS) ×2 IMPLANT
CUFF TOURN SGL QUICK 34 (TOURNIQUET CUFF) ×2
CUFF TRNQT CYL 34X4X40X1 (TOURNIQUET CUFF) ×1 IMPLANT
DRAPE EXTREMITY T 121X128X90 (DRAPE) ×2 IMPLANT
DRAPE LG THREE QUARTER DISP (DRAPES) ×2 IMPLANT
DRAPE POUCH INSTRU U-SHP 10X18 (DRAPES) ×2 IMPLANT
DRAPE U-SHAPE 47X51 STRL (DRAPES) ×2 IMPLANT
DRSG ADAPTIC 3X8 NADH LF (GAUZE/BANDAGES/DRESSINGS) ×2 IMPLANT
DRSG PAD ABDOMINAL 8X10 ST (GAUZE/BANDAGES/DRESSINGS) ×3 IMPLANT
DURAPREP 26ML APPLICATOR (WOUND CARE) ×2 IMPLANT
ELECT REM PT RETURN 9FT ADLT (ELECTROSURGICAL) ×2
ELECTRODE REM PT RTRN 9FT ADLT (ELECTROSURGICAL) ×1 IMPLANT
EVACUATOR 1/8 PVC DRAIN (DRAIN) ×2 IMPLANT
FACESHIELD LNG OPTICON STERILE (SAFETY) ×8 IMPLANT
GAUZE XEROFORM 1X8 LF (GAUZE/BANDAGES/DRESSINGS) ×1 IMPLANT
GAUZE XEROFORM 5X9 LF (GAUZE/BANDAGES/DRESSINGS) ×2 IMPLANT
GLOVE BIO SURGEON STRL SZ7.5 (GLOVE) ×2 IMPLANT
GLOVE BIOGEL PI IND STRL 6.5 (GLOVE) IMPLANT
GLOVE BIOGEL PI IND STRL 7.5 (GLOVE) IMPLANT
GLOVE BIOGEL PI INDICATOR 6.5 (GLOVE) ×1
GLOVE BIOGEL PI INDICATOR 7.5 (GLOVE) ×1
GLOVE ECLIPSE 7.0 STRL STRAW (GLOVE) ×1 IMPLANT
GLOVE SURG SS PI 6.5 STRL IVOR (GLOVE) ×2 IMPLANT
GLOVE SURG SS PI 7.5 STRL IVOR (GLOVE) ×2 IMPLANT
GOWN STRL NON-REIN LRG LVL3 (GOWN DISPOSABLE) ×1 IMPLANT
GOWN STRL REIN XL XLG (GOWN DISPOSABLE) ×4 IMPLANT
HANDPIECE INTERPULSE COAX TIP (DISPOSABLE) ×2
IMMOBILIZER KNEE 22 UNIV (SOFTGOODS) ×1 IMPLANT
KIT BASIN OR (CUSTOM PROCEDURE TRAY) ×2 IMPLANT
MARKER SPHERE PSV REFLC THRD 5 (MARKER) ×3 IMPLANT
NS IRRIG 1000ML POUR BTL (IV SOLUTION) ×2 IMPLANT
PACK TOTAL JOINT (CUSTOM PROCEDURE TRAY) ×2 IMPLANT
PADDING CAST COTTON 6X4 STRL (CAST SUPPLIES) ×2 IMPLANT
PADDING WEBRIL 6 STERILE (GAUZE/BANDAGES/DRESSINGS) ×1 IMPLANT
PIN SCHANZ 4MM 130MM (PIN) ×8 IMPLANT
POSITIONER SURGICAL ARM (MISCELLANEOUS) ×2 IMPLANT
SET HNDPC FAN SPRY TIP SCT (DISPOSABLE) ×1 IMPLANT
SET PAD KNEE POSITIONER (MISCELLANEOUS) ×2 IMPLANT
SPONGE GAUZE 4X4 12PLY (GAUZE/BANDAGES/DRESSINGS) ×2 IMPLANT
SPONGE LAP 18X18 X RAY DECT (DISPOSABLE) ×3 IMPLANT
STAPLER VISISTAT 35W (STAPLE) ×2 IMPLANT
STRIP CLOSURE SKIN 1/2X4 (GAUZE/BANDAGES/DRESSINGS) ×4 IMPLANT
SUCTION FRAZIER 12FR DISP (SUCTIONS) ×2 IMPLANT
SUT ETHILON 3 0 PS 1 (SUTURE) ×2 IMPLANT
SUT VIC AB 0 CT1 27 (SUTURE) ×4
SUT VIC AB 0 CT1 27XBRD ANTBC (SUTURE) ×2 IMPLANT
SUT VIC AB 1 CT1 27 (SUTURE) ×6
SUT VIC AB 1 CT1 27XBRD ANTBC (SUTURE) ×3 IMPLANT
SUT VIC AB 2-0 CT1 27 (SUTURE) ×6
SUT VIC AB 2-0 CT1 TAPERPNT 27 (SUTURE) ×3 IMPLANT
SUT VIC AB 4-0 PS2 27 (SUTURE) ×2 IMPLANT
TOWEL OR 17X26 10 PK STRL BLUE (TOWEL DISPOSABLE) ×6 IMPLANT
TRAY FOLEY CATH 14FRSI W/METER (CATHETERS) ×2 IMPLANT
WATER STERILE IRR 1500ML POUR (IV SOLUTION) ×2 IMPLANT
WRAP KNEE MAXI GEL POST OP (GAUZE/BANDAGES/DRESSINGS) ×2 IMPLANT

## 2011-11-19 NOTE — H&P (Signed)
Franklin Jones is an 61 y.o. male.   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  Failed conservative treatment.  Wishes to proceed with a right total knee replacement.  Understands the risks of nerve and vessel injury, DVT and PE.  The benefits are improved function and quality of life and decreased pain.  Past Medical History  Diagnosis Date  . Allergic rhinitis   . History of asbestos exposure     followed by Dr Franklin Jones- states stable years- last CT chest 7/12 in Epic  . Arthritis   . Recurrent upper respiratory infection (URI)     head cold with congestion and cough x 10 days- was given Z PAck by PCP Dr Franklin Jones- states is clearing- small amt productive yellow and clear  . Degenerative arthritis of knee 11/19/2011    Past Surgical History  Procedure Date  . Left finger amputation   . Fracture surgery     traumatic injury left small finger with amputation    Family History  Problem Relation Age of Onset  . Allergies Mother   . Heart disease Father   . Lung cancer Brother    Social History:  reports that he quit smoking about 26 years ago. His smoking use included Cigarettes. He has a 20 pack-year smoking history. He has never used smokeless tobacco. He reports that he drinks about 3 ounces of alcohol per week. He reports that he does not use illicit drugs.  Allergies:  Allergies  Allergen Reactions  . Penicillins     REACTION: rash    Medications Prior to Admission  Medication Dose Route Frequency Provider Last Rate Last Dose  . clindamycin (CLEOCIN) IVPB 600 mg  600 mg Intravenous 60 min Pre-Op Franklin Hitch, MD       Medications Prior to Admission  Medication Sig Dispense Refill  . loratadine (CLARITIN) 10 MG tablet Take 10 mg by mouth.      . Glucosamine-Chondroit-Vit C-Mn (GLUCOSAMINE CHONDR 1500 COMPLX) CAPS Take 2 capsules by mouth daily.       . Multiple Vitamin  (MULTIVITAMIN) tablet Take 1 tablet by mouth daily.       . Naproxen Sodium (ALEVE) 220 MG CAPS Take 440 mg by mouth every 12 (twelve) hours. Per bottle directions as needed        Results for orders placed during the hospital encounter of 11/17/11 (from the past 48 hour(s))  URINALYSIS, ROUTINE W REFLEX MICROSCOPIC     Status: Abnormal   Collection Time   11/17/11  9:15 AM      Component Value Range Comment   Color, Urine YELLOW  YELLOW     APPearance CLEAR  CLEAR     Specific Gravity, Urine 1.027  1.005 - 1.030     pH 5.5  5.0 - 8.0     Glucose, UA NEGATIVE  NEGATIVE (mg/dL)    Hgb urine dipstick TRACE (*) NEGATIVE     Bilirubin Urine NEGATIVE  NEGATIVE     Ketones, ur NEGATIVE  NEGATIVE (mg/dL)    Protein, ur NEGATIVE  NEGATIVE (mg/dL)    Urobilinogen, UA 0.2  0.0 - 1.0 (mg/dL)    Nitrite NEGATIVE  NEGATIVE     Leukocytes, UA NEGATIVE  NEGATIVE    URINE MICROSCOPIC-ADD ON     Status: Normal   Collection Time   11/17/11  9:15 AM  Component Value Range Comment   RBC / HPF 0-2  <3 (RBC/hpf)   SURGICAL PCR SCREEN     Status: Normal   Collection Time   11/17/11  9:27 AM      Component Value Range Comment   MRSA, PCR NEGATIVE  NEGATIVE     Staphylococcus aureus NEGATIVE  NEGATIVE    BASIC METABOLIC PANEL     Status: Abnormal   Collection Time   11/17/11  9:30 AM      Component Value Range Comment   Sodium 135  135 - 145 (mEq/L)    Potassium 4.1  3.5 - 5.1 (mEq/L)    Chloride 101  96 - 112 (mEq/L)    CO2 26  19 - 32 (mEq/L)    Glucose, Bld 77  70 - 99 (mg/dL)    BUN 24 (*) 6 - 23 (mg/dL)    Creatinine, Ser 1.61  0.50 - 1.35 (mg/dL)    Calcium 9.9  8.4 - 10.5 (mg/dL)    GFR calc non Af Amer 56 (*) >90 (mL/min)    GFR calc Af Amer 64 (*) >90 (mL/min)   CBC     Status: Normal   Collection Time   11/17/11  9:30 AM      Component Value Range Comment   WBC 7.0  4.0 - 10.5 (K/uL)    RBC 4.76  4.22 - 5.81 (MIL/uL)    Hemoglobin 14.5  13.0 - 17.0 (g/dL)    HCT 09.6  04.5 - 40.9  (%)    MCV 88.7  78.0 - 100.0 (fL)    MCH 30.5  26.0 - 34.0 (pg)    MCHC 34.4  30.0 - 36.0 (g/dL)    RDW 81.1  91.4 - 78.2 (%)    Platelets 217  150 - 400 (K/uL)   APTT     Status: Normal   Collection Time   11/17/11  9:30 AM      Component Value Range Comment   aPTT 33  24 - 37 (seconds)   PROTIME-INR     Status: Normal   Collection Time   11/17/11  3:16 PM      Component Value Range Comment   Prothrombin Time 13.1  11.6 - 15.2 (seconds)    INR 0.97  0.00 - 1.49     Dg Chest 2 View  11/17/2011  *RADIOLOGY REPORT*  Clinical Data: Preop radiograph  CHEST - 2 VIEW  Comparison: 05/17/2011  Findings: Heart size is normal.  No pleural effusion or pulmonary edema noted.  No airspace consolidation identified.  There is mild thoracic spondylosis.  IMPRESSION:  1.  No acute findings.  Original Report Authenticated By: Rosealee Albee, M.D.    Review of Systems  All other systems reviewed and are negative.    Blood pressure 155/99, pulse 90, temperature 97.4 F (36.3 C), temperature source Oral, resp. rate 18, SpO2 97.00%. Physical Exam  Constitutional: He is oriented to person, place, and time. He appears well-developed and well-nourished.  HENT:  Head: Atraumatic.  Eyes: EOM are normal. Pupils are equal, round, and reactive to light.  Neck: Normal range of motion. Neck supple.  Cardiovascular: Normal rate and regular rhythm.   Respiratory: Effort normal and breath sounds normal.  GI: Soft. Bowel sounds are normal.  Musculoskeletal:       Right knee: He exhibits decreased range of motion and swelling. tenderness found. Medial joint line and lateral joint line tenderness noted.  Neurological: He is alert and  oriented to person, place, and time.  Skin: Skin is warm and dry.  Psychiatric: He has a normal mood and affect.     Assessment/Plan To the OR today for a right total knee replacement then admission as an inpatient.  Franklin Jones Y 11/19/2011, 7:24 AM

## 2011-11-19 NOTE — Anesthesia Postprocedure Evaluation (Signed)
  Anesthesia Post-op Note  Patient: Franklin Jones  Procedure(s) Performed:  COMPUTER ASSISTED TOTAL KNEE ARTHROPLASTY - Right Total Knee Arthroplasty  Patient Location: PACU  Anesthesia Type: GA combined with regional for post-op pain  Level of Consciousness: awake and alert   Airway and Oxygen Therapy: Patient Spontanous Breathing  Post-op Pain: mild  Post-op Assessment: Post-op Vital signs reviewed, Patient's Cardiovascular Status Stable, Respiratory Function Stable, Patent Airway and No signs of Nausea or vomiting  Post-op Vital Signs: stable  Complications: No apparent anesthesia complications

## 2011-11-19 NOTE — Anesthesia Procedure Notes (Signed)
Anesthesia Regional Block:  Femoral nerve block  Pre-Anesthetic Checklist: ,, timeout performed, Correct Patient, Correct Site, Correct Laterality, Correct Procedure, Correct Position, site marked, Risks and benefits discussed,  Surgical consent,  Pre-op evaluation,  At surgeon's request and post-op pain management  Laterality: Right  Prep: chloraprep       Needles:  Injection technique: Single-shot  Needle Type: Stimiplex     Needle Length:cm 4 cm Needle Gauge: 21 G    Additional Needles:  Procedures: ultrasound guided and nerve stimulator Femoral nerve block Narrative:  Start time: 11/19/2011 9:35 AM End time: 11/19/2011 9:45 AM Injection made incrementally with aspirations every 5 mL.  Performed by: Personally  Anesthesiologist: Gaetano Hawthorne MD  Additional Notes: Patient tolerated the procedure well without complications  Femoral nerve block

## 2011-11-19 NOTE — Anesthesia Preprocedure Evaluation (Addendum)
Anesthesia Evaluation  Patient identified by MRN, date of birth, ID band Patient awake    Reviewed: Allergy & Precautions, H&P , NPO status , Patient's Chart, lab work & pertinent test results  Airway Mallampati: II TM Distance: >3 FB Neck ROM: full    Dental No notable dental hx.    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  Pulmonary exam normal       Cardiovascular Exercise Tolerance: Good neg cardio ROS regular Normal    Neuro/Psych Negative Neurological ROS  Negative Psych ROS   GI/Hepatic negative GI ROS, Neg liver ROS,   Endo/Other  Negative Endocrine ROS  Renal/GU negative Renal ROS  Genitourinary negative   Musculoskeletal   Abdominal   Peds  Hematology negative hematology ROS (+)   Anesthesia Other Findings   Reproductive/Obstetrics negative OB ROS                           Anesthesia Physical Anesthesia Plan  ASA: II  Anesthesia Plan: General   Post-op Pain Management:    Induction:   Airway Management Planned:   Additional Equipment:   Intra-op Plan:   Post-operative Plan:   Informed Consent: I have reviewed the patients History and Physical, chart, labs and discussed the procedure including the risks, benefits and alternatives for the proposed anesthesia with the patient or authorized representative who has indicated his/her understanding and acceptance.   Dental Advisory Given  Plan Discussed with: CRNA  Anesthesia Plan Comments:         Anesthesia Quick Evaluation

## 2011-11-19 NOTE — Transfer of Care (Signed)
Immediate Anesthesia Transfer of Care Note  Patient: Franklin Jones  Procedure(s) Performed:  COMPUTER ASSISTED TOTAL KNEE ARTHROPLASTY - Right Total Knee Arthroplasty  Patient Location: PACU  Anesthesia Type: General and Femoral Nerve Block  Level of Consciousness: sedated, patient cooperative and responds to stimulaton  Airway & Oxygen Therapy: Patient Spontanous Breathing and Patient connected to face mask oxgen  Post-op Assessment: Report given to PACU RN and Post -op Vital signs reviewed and stable  Post vital signs: Reviewed and stable  Complications: No apparent anesthesia complications

## 2011-11-19 NOTE — Brief Op Note (Signed)
11/19/2011  1:00 PM  PATIENT:  Franklin Jones  61 y.o. male  PRE-OPERATIVE DIAGNOSIS:  Severe osteoarthritis right knee  POST-OPERATIVE DIAGNOSIS:  severe osteoarthritis right knee  PROCEDURE:  Procedure(s): COMPUTER ASSISTED TOTAL KNEE ARTHROPLASTY  SURGEON:  Surgeon(s): Kathryne Hitch, MD  PHYSICIAN ASSISTANT:   ASSISTANTS: Maud Deed, PA-C   ANESTHESIA:   regional and general  EBL:  Total I/O In: 2000 [I.V.:2000] Out: 400 [Urine:300; Blood:100]  BLOOD ADMINISTERED:none  DRAINS: (medium) Hemovact drain(s) in the knee with  Suction Open   LOCAL MEDICATIONS USED:  NONE  SPECIMEN:  No Specimen  DISPOSITION OF SPECIMEN:  N/A  COUNTS:  YES  TOURNIQUET:   Total Tourniquet Time Documented: Thigh (Right) - 96 minutes  DICTATION: .Other Dictation: Dictation Number [id number]  PLAN OF CARE: Admit to inpatient   PATIENT DISPOSITION:  PACU - hemodynamically stable.   Delay start of Pharmacological VTE agent (>24hrs) due to surgical blood loss or risk of bleeding:  {YES/NO/NOT APPLICABLE:20182

## 2011-11-20 LAB — BASIC METABOLIC PANEL
BUN: 21 mg/dL (ref 6–23)
Chloride: 104 mEq/L (ref 96–112)
Creatinine, Ser: 1.29 mg/dL (ref 0.50–1.35)
Glucose, Bld: 150 mg/dL — ABNORMAL HIGH (ref 70–99)
Potassium: 5.1 mEq/L (ref 3.5–5.1)

## 2011-11-20 LAB — CBC
HCT: 31.7 % — ABNORMAL LOW (ref 39.0–52.0)
Hemoglobin: 10.5 g/dL — ABNORMAL LOW (ref 13.0–17.0)
MCHC: 33.1 g/dL (ref 30.0–36.0)
MCV: 91.4 fL (ref 78.0–100.0)
WBC: 12.3 10*3/uL — ABNORMAL HIGH (ref 4.0–10.5)

## 2011-11-20 NOTE — Progress Notes (Signed)
Physical Therapy Treatment Patient Details Name: NASRI BOAKYE MRN: [id number] DOB: 1951-01-18 Today's Date: 11/20/2011 Time: 1300-1331  1G, 1TE PT Assessment/Plan  PT - Assessment/Plan Comments on Treatment Session: Improved performance this p.m. Improved gait and safey, but pt still requires Min cues.  PT Plan: Discharge plan remains appropriate Follow Up Recommendations: Home health PT Equipment Recommended: None recommended by PT PT Goals  Acute Rehab PT Goals PT Goal: Sit to Supine/Side - Progress: Progressing toward goal PT Goal: Sit to Stand - Progress: Progressing toward goal PT Goal: Stand to Sit - Progress: Progressing toward goal PT Goal: Ambulate - Progress: Progressing toward goal PT Goal: Perform Home Exercise Program - Progress: Progressing toward goal  PT Treatment Precautions/Restrictions  Precautions Precautions: Fall Required Braces or Orthoses: Yes Knee Immobilizer: Discontinue once straight leg raise with < 10 degree lag Restrictions Weight Bearing Restrictions: Yes RLE Weight Bearing: Weight bearing as tolerated Mobility (including Balance) Bed Mobility Bed Mobility: Yes Sit to Supine: 4: Min assist Sit to Supine - Details (indicate cue type and reason): assist for R LE onto bed. VCs safety, technique.  Transfers Transfers: Yes Sit to Stand: 4: Min assist;With upper extremity assist;With armrests;From chair/3-in-1 Sit to Stand Details (indicate cue type and reason): VCs safety, technique, hand placement. Assist to rise, stabilize.  Stand to Sit: 4: Min assist;With upper extremity assist;To bed;With armrests Stand to Sit Details: VCs safety, technique, hand placement. Assist to rise, stabilize. Ambulation/Gait Ambulation/Gait: Yes Ambulation/Gait Assistance: 4: Min assist Ambulation/Gait Assistance Details (indicate cue type and reason): VCs safetym, sequence, technique. Improved gait this p.m session.  Ambulation Distance (Feet): 150 Feet Assistive  device: Rolling walker Gait Pattern: Step-to pattern;Antalgic;Decreased stance time - right;Decreased step length - right;Decreased stride length    Exercise  Total Joint Exercises Ankle Circles/Pumps: AROM;Both;10 reps;Supine Quad Sets: AROM;Right;10 reps;Supine Short Arc Quad: AAROM;Right;10 reps;Supine Heel Slides: AAROM;Right;10 reps;Supine Hip ABduction/ADduction: AAROM;Right;10 reps;Supine (difficulty maintaining knee extension) Straight Leg Raises: AAROM;Right;10 reps;Supine (extension lag > 10 degrees) End of Session PT - End of Session Equipment Utilized During Treatment: Gait belt;Right knee immobilizer Activity Tolerance: Patient tolerated treatment well Patient left: with call bell in reach;with family/visitor present General Behavior During Session: [facility] for tasks performed Cognition: [facility] for tasks performed  Rebeca Alert [facility] 11/20/2011, 3:08 PM [phone]

## 2011-11-20 NOTE — Op Note (Signed)
NAMEMarland Kitchen  Franklin Jones, Franklin Jones NO.:  [id number]  MEDICAL RECORD NO.:  [id number]  LOCATION:  1608                         FACILITY:  [facility]  PHYSICIAN:  Vanita Panda. Magnus Ivan, M.D.DATE OF BIRTH:  1951-09-15  DATE OF PROCEDURE:  11/19/2011 DATE OF DISCHARGE:                              OPERATIVE REPORT   PREOPERATIVE DIAGNOSIS:  Severe osteoarthritis and degenerative joint disease, with bone on bone wear, right knee.  POSTOPERATIVE DIAGNOSIS:  Severe osteoarthritis and degenerative joint disease, with bone on bone wear, right knee.  PROCEDURE:  Right total knee arthroplasty utilizing computer navigation as assistance.  IMPLANTS:  DePuy rotating platform knee with size 5 femur, size 4 tibial tray, 15 mm polyethylene insert, 38 patellar button.  SURGEON:  Vanita Panda. Magnus Ivan, M.D.  ASSISTANT:  Wende Neighbors, P.A.  ANESTHESIA: 1. Right leg femoral nerve block. 2. General.  ESTIMATED BLOOD LOSS:  Less than 300 mL.  TOURNIQUET TIME:  Less than 2 hours.  COMPLICATIONS:  None.  INDICATIONS:  Franklin Jones is a 61 year old gentleman with a well documented end-stage arthritis of both his knees with the right worse than the left.  He has bone on bone wear and has failed all types of conservative treatment.  He wishes to proceed with a total knee arthroplasty at this point.  He understands the risks and benefits of surgery including the risk of acute blood loss anemia, DVT, PE, nerve and vessel injury, and the benefits of decreased pain, increased mobility, increased quality of life.  DESCRIPTION OF PROCEDURE:  After informed consent was obtained, the appropriate right knee was marked, anesthesia obtained with a femoral nerve block.  He was brought to the operating room, placed supine on the operating table.  General anesthesia was then obtained.  A nonsterile tourniquet was placed on his upper right thigh and his right leg was prepped and draped with DuraPrep  and sterile drapes including a sterile stockinette.  A time-out was called to identify the correct patient, and correct right knee.  I then used an Esmarch to wrap out the leg and tourniquet was inflated to 300 mm of pressure.  A midline incision was then made directly over his knee and carried down to the knee joint. Medial parapatellar arthrotomy was carried out.  He had a very severe varus deformity with complete loss of medial joint space.  He also had a flexion contracture of several degrees.  Once we cleaned the knee of debris and soft tissue as well as osteophytes, we proceeded with the computer navigation portion of the case.  Two small Steinmann pins were inserted through separate stab incisions in the distal third of the tibia from an anterior medial to posterior direction.  Through the main incision in the metaphyseal section of bone 2 pins were also placed. Off these pins, small globes were placed so that we could use a stylet to make a computer generated model of the knee taking various points throughout the knee as well as the ankle and the hip.  With the computer plan we then made a tibia cut.  We then used soft tissue tensioners to tension the knee in flexion and extension.  We next made a distal femoral cut and balanced the knee with an extension block.  Next we turned attention back to the femur.  We set our rotation and then placed a 4-in-1 cutting block.  We made anterior and posterior cuts as well as chamfer cuts.  We then made a femoral box cut and turned attention back to the tibia.  We chose a size 4 tibia and we drilled for the post and punched for the keel for this.  We then placed a trial size 4 tibia followed by the trial size 5 femur and trialed several polyethylene inserts up to a 15.  I felt a 15 was externally stable.  We trialed him in full extension and flexion to 110 degrees and he was stable to varus and valgus stressing and we improved his varus from 11  degrees down to 2 degrees on the computer plan.  I then removed all Steinmann pins.  We drilled lugs into the femur and drilled holes for patella button.  We removed all trial instrumentation and irrigated the knee with normal saline solution.  We then cemented the real tibial tray size 4 followed by the real femoral component size 5.  We placed the real 15 mm polyethylene insert and cemented the patellar button.  Once this had all dried, we let the tourniquet down.  Hemostasis was obtained with electrocautery.  We copiously irrigated the knee with 3 L of normal saline solution using pulsatile lavage.  We placed a medium Hemovac drain in the arthrotomy and closed the arthrotomy with a number 1 Vicryl suture followed by 2-0 Vicryl on the subcutaneous tissue and interrupted staples on the skin.  Again all Steinmann pins were also removed prior to this.  Well padded sterile dressing was applied.  The patient was awakened, extubated, and taken to the recovery room in stable condition. All final counts were correct.  No complications noted.     Vanita Panda. Magnus Ivan, M.D.     CYB/MEDQ  D:  11/19/2011  T:  11/20/2011  Job:  [id number]

## 2011-11-20 NOTE — Evaluation (Addendum)
Physical Therapy Evaluation Patient Details Name: Franklin Jones MRN: [id number] DOB: 1951/06/03 Today's Date: 11/20/2011 Time: 7829-5621  Eval II Problem List:  Patient Active Problem List  Diagnoses  . ALLERGIC RHINITIS  . Nonspecific (abnormal) findings on radiological and other examination of body structure  . TOBACCO ABUSE, HX OF  . ASBESTOS EXPOSURE, HX OF  . CT, CHEST, ABNORMAL  . Degenerative arthritis of knee    Past Medical History:  Past Medical History  Diagnosis Date  . Allergic rhinitis   . History of asbestos exposure     followed by Dr Ortencia Kick- states stable years- last CT chest 7/12 in Epic  . Arthritis   . Recurrent upper respiratory infection (URI)     head cold with congestion and cough x 10 days- was given Z PAck by PCP Dr Britta Mccreedy- states is clearing- small amt productive yellow and clear  . Degenerative arthritis of knee 11/19/2011   Past Surgical History:  Past Surgical History  Procedure Date  . Left finger amputation   . 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 Pt will benefit from skilled PT in the acute care setting to improve strength, activity tolerance, safety, gait, and balance in preperation for D/C home with wife.  PT Recommendation/Assessment: Patient will need skilled PT in the acute care venue PT Problem List: Decreased strength;Decreased range of motion;Decreased activity tolerance;Decreased mobility;Decreased balance;Decreased safety awareness;Decreased knowledge of use of DME;Pain PT Therapy Diagnosis : Difficulty walking;Acute pain;Abnormality of gait PT Plan PT Frequency: 7X/week PT Treatment/Interventions: DME instruction;Gait training;Stair training;Functional mobility training;Therapeutic activities;Therapeutic exercise;Balance training;Patient/family education PT Recommendation Recommendations for Other Services:  OT consult Follow Up Recommendations: Home health PT Equipment Recommended: None recommended by PT PT Goals  Acute Rehab PT Goals PT Goal Formulation: With patient Time For Goal Achievement: 7 days Pt will go Supine/Side to Sit: with supervision PT Goal: Supine/Side to Sit - Progress: Goal set today Pt will go Sit to Supine/Side: with supervision PT Goal: Sit to Supine/Side - Progress: Goal set today Pt will go Sit to Stand: with supervision PT Goal: Sit to Stand - Progress: Goal set today Pt will go Stand to Sit: with supervision PT Goal: Stand to Sit - Progress: Goal set today Pt will Ambulate: >150 feet;with supervision;with least restrictive assistive device PT Goal: Ambulate - Progress: Goal set today Pt will Go Up / Down Stairs: 3-5 stairs;with rail(s);with least restrictive assistive device (4 steps) PT Goal: Up/Down Stairs - Progress: Goal set today Pt will Perform Home Exercise Program: with supervision, verbal cues required/provided PT Goal: Perform Home Exercise Program - Progress: Goal set today  PT Evaluation Precautions/Restrictions  Precautions Precautions: Fall Required Braces or Orthoses: Yes Knee Immobilizer: Discontinue once straight leg raise with < 10 degree lag Restrictions RLE Weight Bearing: Weight bearing as tolerated Prior Functioning  Home Living Lives With: Spouse Receives Help From: Family Type of Home: House Home Layout: Multi-level Home Access: Stairs to enter Entrance Stairs-Rails: Left Entrance Stairs-Number of Steps: 4 Home Adaptive Equipment: Walker - rolling;Bedside commode/3-in-1 Additional Comments: Pt and wife state they have access to all DME Prior Function Level of Independence: Independent with basic ADLs;Independent with gait Driving: Yes Cognition Cognition Arousal/Alertness: Awake/alert Overall Cognitive Status: Appears within functional limits for tasks assessed Orientation Level: Oriented  X4 Sensation/Coordination Sensation Light Touch: Appears Intact Coordination Gross Motor Movements are Fluid and Coordinated: Yes Extremity Assessment  RLE Strength RLE Overall Strength Comments: SLR 2+/5, moves ankle well.  LLE Assessment LLE Assessment: Within Functional Limits Mobility (including Balance) Bed Mobility Bed Mobility: Yes Supine to Sit: 4: Min assist Supine to Sit Details (indicate cue type and reason): Assist for R LE. VCS safety, technique Transfers Transfers: Yes Sit to Stand: 4: Min assist;With upper extremity assist;From bed;From elevated surface Sit to Stand Details (indicate cue type and reason): VCs safety, technique, hand placement. Assist to rise, stabilize. Pt impulsive. Stand to Sit: 4: Min assist;With upper extremity assist;To chair/3-in-1;With armrests Stand to Sit Details: VCs safety, technique, hand placement. Assist to control descent.  Ambulation/Gait Ambulation/Gait: Yes Ambulation/Gait Assistance: 4: Min assist Ambulation/Gait Assistance Details (indicate cue type and reason): Mod VCs safety, technique, sequence. Pt moves quickly. VCs to slow pace. c/o R knee instabiilty and feeling that R knee is "crooked".  O2: 86% RA, HR 125 bpm immediately after ambulation. Ambulation Distance (Feet): 90 Feet Assistive device: Rolling walker Gait Pattern: Decreased stride length;Decreased step length - right;Step-through pattern;Antalgic;Decreased stance time - right  Posture/Postural Control Posture/Postural Control: No significant limitations Exercise    End of Session PT - End of Session Equipment Utilized During Treatment: Gait belt;Right knee immobilizer Activity Tolerance: Patient limited by fatigue Patient left: in chair;with call bell in reach;with family/visitor present General Behavior During Session: [facility] for tasks performed Cognition: [facility] for tasks performed  Rebeca Alert [facility] 11/20/2011, 9:53 AM [phone]

## 2011-11-20 NOTE — Progress Notes (Addendum)
CARE MANAGEMENT NOTE 11/20/2011  Patient:  MATEUSZ, NEILAN   Account Number:  [id number]  Date Initiated:  11/20/2011  Documentation initiated by:  Loxley Schmale  Subjective/Objective Assessment:   61 yo male admitted s/p right total knee. PTA pt lived with spouse.     Action/Plan:   Home when stable   Anticipated DC Date:  11/22/2011   Anticipated DC Plan:  HOME W HOME HEALTH SERVICES  In-house referral  NA      DC Planning Services  CM consult      [facility] Choice  HOME HEALTH   Choice offered to / List presented to:  C-1 Patient   DME arranged  NA      DME agency  NA     HH arranged  HH-2 PT      Status of service:  In process, will continue to follow Medicare Important Message given?   (If response is "NO", the following Medicare IM given date fields will be blank) Date Medicare IM given:   Date Additional Medicare IM given:    Discharge Disposition:    Per UR Regulation:    Comments:  11/20/11 1316 Cm spoke with pt with wife at the bedside concerning d/c planning. Pt given choice list for [facility] for HHPT. Pt states having RW and crutches at home. Wife to assist in home care. No other HH services or DME requested.  Leonie Green [phone]

## 2011-11-20 NOTE — Progress Notes (Signed)
Subjective: 1 Day Post-Op Procedure(s) (LRB): COMPUTER ASSISTED TOTAL KNEE ARTHROPLASTY (Right) Patient reports pain as 3 on 0-10 scale.    Objective: Vital signs in last 24 hours: Temp:  [97.1 F (36.2 C)-98.2 F (36.8 C)] 98.2 F (36.8 C) (02/02 1400) Pulse Rate:  [61-125] 82  (02/02 1400) Resp:  [14-18] 18  (02/02 1400) BP: (103-122)/(62-80) 118/72 mmHg (02/02 1400) SpO2:  [86 %-100 %] 98 % (02/02 1400)  Intake/Output from previous day: 02/01 0701 - 02/02 0700 In: 4858.8 [P.O.:1200; I.V.:3603.8; IV Piggyback:55] Out: 2490 [Urine:1550; Drains:840; Blood:100] Intake/Output this shift: Total I/O In: 840 [P.O.:840] Out: -    Basename 11/20/11 0341  HGB 10.5*    Basename 11/20/11 0341  WBC 12.3*  RBC 3.47*  HCT 31.7*  PLT 191    Basename 11/20/11 0341  NA 135  K 5.1  CL 104  CO2 26  BUN 21  CREATININE 1.29  GLUCOSE 150*  CALCIUM 8.3*    Basename 11/17/11 1516  LABPT --  INR 0.97    Neurologically intact  Assessment/Plan: 1 Day Post-Op Procedure(s) (LRB): COMPUTER ASSISTED TOTAL KNEE ARTHROPLASTY (Right) Up with therapy  Muad Noga C 11/20/2011, 2:47 PM

## 2011-11-21 LAB — CBC
HCT: 27.1 % — ABNORMAL LOW (ref 39.0–52.0)
Hemoglobin: 9.4 g/dL — ABNORMAL LOW (ref 13.0–17.0)
MCH: 31.2 pg (ref 26.0–34.0)
MCHC: 34.7 g/dL (ref 30.0–36.0)

## 2011-11-21 NOTE — Progress Notes (Signed)
CARE MANAGEMENT NOTE 11/21/2011  Patient:  Franklin Jones, Franklin Jones   Account Number:  [id number]  Date Initiated:  11/20/2011  Documentation initiated by:  Evalina Tabak  Subjective/Objective Assessment:   61 yo male admitted s/p right total knee. PTA pt lived with spouse.     Action/Plan:   Home when stable   Anticipated DC Date:  11/22/2011   Anticipated DC Plan:  HOME W HOME HEALTH SERVICES  In-house referral  NA      DC Planning Services  CM consult      [facility] Choice  HOME HEALTH   Choice offered to / List presented to:  C-1 Patient   DME arranged  NA      DME agency  NA     HH arranged  HH-2 PT      HH agency  [facility]   Status of service:  In process, will continue to follow Medicare Important Message given?   (If response is "NO", the following Medicare IM given date fields will be blank) Date Medicare IM given:   Date Additional Medicare IM given:    Discharge Disposition:    Per UR Regulation:    Comments:  11/21/11 0938 oer pt choice [facility] to provide HHPT. [facility] rep Talmadge Coventry notified of referral. Plan remains same as previously stated. Awaiting HH orders from MD.  11/20/11 1316 Cm spoke with pt with wife at the bedside concerning d/c planning. Pt given choice list for [facility] for HHPT. Pt states having RW and crutches at home. Wife to assist in home care. No other HH services or DME requested.  Leonie Green [phone]

## 2011-11-21 NOTE — Progress Notes (Signed)
Physical Therapy Treatment Patient Details Name: Franklin Jones MRN: [id number] DOB: 10-28-1950 Today's Date: 11/21/2011  PT Assessment/Plan  PT - Assessment/Plan Comments on Treatment Session: improved in gait.  Needs to practice steps once more prior to D/C PT Plan: Discharge plan remains appropriate PT Frequency: 7X/week Follow Up Recommendations: Home health PT PT Goals  Acute Rehab PT Goals PT Goal: Sit to Stand - Progress: Met PT Goal: Stand to Sit - Progress: Met PT Goal: Ambulate - Progress: Progressing toward goal PT Goal: Up/Down Stairs - Progress: Progressing toward goal PT Goal: Perform Home Exercise Program - Progress: Progressing toward goal  PT Treatment Precautions/Restrictions  Precautions Precautions: Fall Required Braces or Orthoses: Yes Knee Immobilizer: Discontinue once straight leg raise with < 10 degree lag Restrictions Weight Bearing Restrictions: Yes RLE Weight Bearing: Weight bearing as tolerated Mobility (including Balance) Bed Mobility Bed Mobility:  (pt wanted to stay up in the chair) Transfers Transfers: Yes Sit to Stand: 5: Supervision;With upper extremity assist;From chair/3-in-1 Sit to Stand Details (indicate cue type and reason): pt using arms to push up from the chair  Stand to Sit: 5: Supervision;With armrests;To chair/3-in-1;With upper extremity assist Ambulation/Gait Ambulation/Gait: Yes Ambulation/Gait Assistance: 4: Min assist Ambulation/Gait Assistance Details (indicate cue type and reason): cues for trunk extension Ambulation Distance (Feet): 100 Feet Assistive device: Rolling walker Gait Pattern: Step-through pattern Stairs: Yes Stairs Assistance: 4: Min assist Stairs Assistance Details (indicate cue type and reason): verbal cues for sequence, pt practiced  twice Stair Management Technique: One rail Right;With crutches;Step to pattern;Forwards Number of Stairs: 3  Height of Stairs: 4  Wheelchair Mobility Wheelchair Mobility: No   Posture/Postural Control Posture/Postural Control: No significant limitations Balance Balance Assessed: No Exercise  Total Joint Exercises Ankle Circles/Pumps: AROM;Seated;10 reps Quad Sets: AROM;Right;10 reps;Seated Gluteal Sets: AROM;Both;5 reps;Standing Long Arc Quad: Right;10 reps;AAROM;Seated Knee Flexion: AAROM;Right;10 reps;Seated End of Session PT - End of Session Equipment Utilized During Treatment: Gait belt;Right knee immobilizer Activity Tolerance: Patient tolerated treatment well Patient left: in chair;with call bell in reach;with family/visitor present Nurse Communication: Mobility status for transfers;Mobility status for ambulation General Behavior During Session: [facility] for tasks performed Cognition: [facility] for tasks performed  [name] 11/21/2011, 3:49 PM

## 2011-11-21 NOTE — Progress Notes (Signed)
Physical Therapy Treatment Patient Details Name: Franklin Jones MRN: [id number] DOB: 1951/02/15 Today's Date: 11/21/2011  PT Assessment/Plan  PT - Assessment/Plan Comments on Treatment Session:  Improved gait and safey, but pt still requires Min cues.  PT Plan: Discharge plan remains appropriate PT Frequency: 7X/week Follow Up Recommendations: Home health PT PT Goals  Acute Rehab PT Goals PT Goal: Sit to Stand - Progress: Progressing toward goal PT Goal: Stand to Sit - Progress: Progressing toward goal PT Goal: Ambulate - Progress: Progressing toward goal PT Goal: Perform Home Exercise Program - Progress: Progressing toward goal  PT Treatment Precautions/Restrictions  Precautions Precautions: Fall Required Braces or Orthoses: Yes Knee Immobilizer: Discontinue once straight leg raise with < 10 degree lag Restrictions Weight Bearing Restrictions: Yes RLE Weight Bearing: Weight bearing as tolerated Mobility (including Balance) Transfers Transfers: Yes Sit to Stand: 5: Supervision;With upper extremity assist;From chair/3-in-1 Stand to Sit: 5: Supervision;With armrests;To chair/3-in-1;With upper extremity assist Ambulation/Gait Ambulation/Gait: Yes Ambulation/Gait Assistance: 4: Min assist Ambulation/Gait Assistance Details (indicate cue type and reason): cues for trunk extension Ambulation Distance (Feet): 200 Feet Assistive device: Rolling walker Gait Pattern: Step-through pattern Stairs: No Wheelchair Mobility Wheelchair Mobility: No  Posture/Postural Control Posture/Postural Control: No significant limitations Balance Balance Assessed: No Exercise  Total Joint Exercises Ankle Circles/Pumps: AROM;Seated;10 reps Quad Sets: AROM;Right;10 reps;Seated Gluteal Sets: AROM;Both;5 reps;Standing Long Arc Quad: Right;10 reps;AAROM;Seated Knee Flexion: AAROM;Right;10 reps;Seated End of Session PT - End of Session Equipment Utilized During Treatment: Gait belt;Right knee  immobilizer Activity Tolerance: Patient tolerated treatment well Patient left: in chair;with call bell in reach;with family/visitor present Nurse Communication: Mobility status for transfers;Mobility status for ambulation General Behavior During Session: [facility] for tasks performed Cognition: [facility] for tasks performed  Franklin Jones 11/21/2011, 3:16 PM

## 2011-11-21 NOTE — Progress Notes (Signed)
Occupational Therapy Evaluation Patient Details Name: Franklin Jones MRN: [id number] DOB: 16-Aug-1951 Today's Date: 11/21/2011  Problem List:  Patient Active Problem List  Diagnoses  . ALLERGIC RHINITIS  . Nonspecific (abnormal) findings on radiological and other examination of body structure  . TOBACCO ABUSE, HX OF  . ASBESTOS EXPOSURE, HX OF  . CT, CHEST, ABNORMAL  . Degenerative arthritis of knee    Past Medical History:  Past Medical History  Diagnosis Date  . Allergic rhinitis   . History of asbestos exposure     followed by Dr Ortencia Kick- states stable years- last CT chest 7/12 in Epic  . Arthritis   . Recurrent upper respiratory infection (URI)     head cold with congestion and cough x 10 days- was given Z PAck by PCP Dr Britta Mccreedy- states is clearing- small amt productive yellow and clear  . Degenerative arthritis of knee 11/19/2011   Past Surgical History:  Past Surgical History  Procedure Date  . Left finger amputation   . 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 male presents to OT s/p Rt. TKA.  Pt. doing well with ADLs, will have assist as needed at home.  All instruction completed, and pt. has all DME.  No further OT needs identified.  Will sign off. OT Recommendation/Assessment: Patient does not need any further OT services OT Recommendation Follow Up Recommendations: No OT follow up Equipment Recommended: None recommended by OT OT Goals    OT Evaluation Precautions/Restrictions  Precautions Precautions: Fall Required Braces or Orthoses: Yes Knee Immobilizer: Discontinue once straight leg raise with < 10 degree lag Restrictions Weight Bearing Restrictions: Yes RLE Weight Bearing: Weight bearing as tolerated Prior Functioning Home Living Lives With: Spouse Receives Help From: Family Type of Home: House Home Layout: Multi-level Home  Access: Stairs to enter Entrance Stairs-Rails: Left Entrance Stairs-Number of Steps: 4 Bathroom Shower/Tub: Forensic scientist: Standard Bathroom Accessibility: Yes How Accessible: Accessible via walker Home Adaptive Equipment: Walker - rolling;Bedside commode/3-in-1;Tub transfer bench;Hand-held shower hose Prior Function Level of Independence: Independent with basic ADLs;Independent with gait Driving: Yes Vocation: Full time employment Vocation Requirements: Climbs ladders and squats a lot Comments: wife supportive ADL ADL Eating/Feeding: Simulated;Independent Where Assessed - Eating/Feeding: Chair Grooming: Performed;Wash/dry hands;Wash/dry face;Supervision/safety Where Assessed - Grooming: Standing at sink Upper Body Bathing: Performed;Set up Where Assessed - Upper Body Bathing: Sitting, chair Lower Body Bathing: Performed;Supervision/safety Where Assessed - Lower Body Bathing: Sit to stand from chair Upper Body Dressing: Simulated;Set up Where Assessed - Upper Body Dressing: Unsupported;Sitting, chair Lower Body Dressing: Simulated;Performed;Supervision/safety (donned/doffed socks) Where Assessed - Lower Body Dressing: Sit to stand from chair Toilet Transfer: Performed;Supervision/safety Toilet Transfer Method: Proofreader: Raised toilet seat with arms (or 3-in-1 over toilet) Toileting - Clothing Manipulation: Performed;Minimal assistance (gowns) Where Assessed - Toileting Clothing Manipulation: Standing Toileting - Hygiene: Simulated;Supervision/safety Where Assessed - Toileting Hygiene: Standing Tub/Shower Transfer: Simulated;Minimal assistance (assist for Rt. LE) Tub/Shower Transfer Method: Ambulating Tub/Shower Transfer Equipment: Counsellor Used: Rolling walker Ambulation Related to ADLs: Pt. ambulated in room with supervision.  Pt. only placing toe on floor.  Encouraged foot flat and increaed WBing - pt.  reports cramping in calf ADL Comments: Pt. is very motivated.  Was able to bathe bil. feet and don/doff socks.  Pt. and wife were instructed in use of tub transfer bench for home use.  Vision/Perception  Vision - History Baseline Vision: Wears glasses all the time Cognition Cognition Arousal/Alertness: Awake/alert Overall Cognitive Status: Appears within functional limits for tasks assessed Orientation Level: Oriented X4 Sensation/Coordination Coordination Gross Motor Movements are Fluid and Coordinated: Yes Fine Motor Movements are Fluid and Coordinated: Yes Extremity Assessment RUE Assessment RUE Assessment: Within Functional Limits LUE Assessment LUE Assessment: Within Functional Limits Mobility  Transfers Transfers: Yes Sit to Stand: 5: Supervision;With upper extremity assist;From chair/3-in-1 Stand to Sit: 5: Supervision;With armrests;To chair/3-in-1;With upper extremity assist Exercises   End of Session OT - End of Session Equipment Utilized During Treatment: Right knee immobilizer Activity Tolerance: Patient tolerated treatment well Patient left: in chair;with call bell in reach;with family/visitor present General Behavior During Session: [facility] for tasks performed Cognition: [facility] for tasks performed   Sabryn Preslar M 11/21/2011, 11:48 AM

## 2011-11-22 ENCOUNTER — Encounter (HOSPITAL_COMMUNITY): Payer: Self-pay | Admitting: Orthopaedic Surgery

## 2011-11-22 LAB — CBC
MCH: 30.4 pg (ref 26.0–34.0)
MCHC: 33.6 g/dL (ref 30.0–36.0)
MCV: 90.7 fL (ref 78.0–100.0)
Platelets: 160 10*3/uL (ref 150–400)
RDW: 13.7 % (ref 11.5–15.5)

## 2011-11-22 MED ORDER — RIVAROXABAN 10 MG PO TABS: 10.0000 mg | ORAL_TABLET | Freq: Every day | ORAL | Status: DC

## 2011-11-22 MED ORDER — METHOCARBAMOL 500 MG PO TABS: 500.0000 mg | ORAL_TABLET | Freq: Four times a day (QID) | ORAL | Status: AC | PRN

## 2011-11-22 MED ORDER — OXYCODONE-ACETAMINOPHEN 5-325 MG PO TABS: 1.0000 | ORAL_TABLET | ORAL | Status: AC | PRN

## 2011-11-22 NOTE — Progress Notes (Signed)
  CARE MANAGEMENT NOTE 11/22/2011  Patient:  GAJE, TENNYSON   Account Number:  [id number]  Date Initiated:  11/20/2011  Documentation initiated by:  DAVIS,TYMEEKA  Subjective/Objective Assessment:   61 yo male admitted s/p right total knee. PTA pt lived with spouse.     Action/Plan:   Home when stable   Anticipated DC Date:  11/22/2011   Anticipated DC Plan:  HOME W HOME HEALTH SERVICES  In-house referral  NA      DC Planning Services  CM consult      [facility] Choice  HOME HEALTH   Choice offered to / List presented to:  C-1 Patient   DME arranged  NA      DME agency  NA     HH arranged  HH-2 PT      HH agency  [facility]   Status of service:  Completed, signed off Medicare Important Message given?   (If response is "NO", the following Medicare IM given date fields will be blank) Date Medicare IM given:   Date Additional Medicare IM given:    Discharge Disposition:  HOME W HOME HEALTH SERVICES  Per UR Regulation:    Comments:  11/22/2011 Raynelle Bring BSN CCM [phone] Pt for discharge. [facility] will start [facility] services tomorrow 11/23/11. [facility] notified of discharge.

## 2011-11-22 NOTE — Discharge Summary (Signed)
Patient ID: Franklin Jones MRN: [id number] DOB/AGE: 62-04-1951 62 y.o.  Admit date: 11/19/2011 Discharge date: 11/22/2011  Admission Diagnoses:  Principal Problem:  *Degenerative arthritis of knee   Discharge Diagnoses:  Same  Past Medical History  Diagnosis Date  . Allergic rhinitis   . History of asbestos exposure     followed by Dr Ortencia Kick- states stable years- last CT chest 7/12 in Epic  . Arthritis   . Recurrent upper respiratory infection (URI)     head cold with congestion and cough x 10 days- was given Z PAck by PCP Dr Britta Mccreedy- states is clearing- small amt productive yellow and clear  . Degenerative arthritis of knee 11/19/2011    Surgeries: Procedure(s): COMPUTER ASSISTED TOTAL KNEE ARTHROPLASTY on 11/19/2011   Consultants:    Discharged Condition: Improved  Hospital Course: Franklin Jones is an 61 y.o. male who was admitted 11/19/2011 for operative treatment ofDegenerative arthritis of knee. Patient has severe unremitting pain that affects sleep, daily activities, and work/hobbies. After pre-op clearance the patient was taken to the operating room on 11/19/2011 and underwent  Procedure(s): COMPUTER ASSISTED TOTAL KNEE ARTHROPLASTY.    Patient was given perioperative antibiotics: Anti-infectives     Start     Dose/Rate Route Frequency Ordered Stop   11/19/11 1630   clindamycin (CLEOCIN) IVPB 600 mg        600 mg 100 mL/hr over 30 Minutes Intravenous Every 6 hours 11/19/11 1434 11/20/11 0418   11/19/11 0700   clindamycin (CLEOCIN) IVPB 600 mg        600 mg 100 mL/hr over 30 Minutes Intravenous 60 min pre-op 11/19/11 0658 11/19/11 1029           Patient was given sequential compression devices, early ambulation, and chemoprophylaxis to prevent DVT.  Patient benefited maximally from hospital stay and there were no complications.    Recent vital signs: Patient Vitals for the past 24 hrs:  BP Temp Temp src Pulse Resp SpO2  11/22/11 0507 133/81 mmHg 98.2 F (36.8 C) Oral  98  18  92 %  2011-11-25 1943 143/79 mmHg 99.6 F (37.6 C) Oral 109  18  90 %  November 25, 2011 1425 122/68 mmHg 99.9 F (37.7 C) Oral 117  18  96 %  11/25/11 1030 155/84 mmHg 98.1 F (36.7 C) Oral 107  18  98 %     Recent laboratory studies:  Basename 11/22/11 0411 2011-11-25 0432 11/20/11 0341  WBC 13.2* 11.5* --  HGB 9.5* 9.4* --  HCT 28.3* 27.1* --  PLT 160 154 --  NA -- -- 135  K -- -- 5.1  CL -- -- 104  CO2 -- -- 26  BUN -- -- 21  CREATININE -- -- 1.29  GLUCOSE -- -- 150*  INR -- -- --  CALCIUM -- -- 8.3*     Discharge Medications:   Medication List  As of 11/22/2011  6:59 AM   STOP taking these medications         ALEVE 220 MG Caps      azithromycin 500 MG tablet         TAKE these medications         fish oil-omega-3 fatty acids 1000 MG capsule   Take 1 g by mouth daily.      Glucosamine Chondr 1500 Complx Caps   Take 2 capsules by mouth daily.      loratadine 10 MG tablet   Commonly known as: CLARITIN   Take 10  mg by mouth.      methocarbamol 500 MG tablet   Commonly known as: ROBAXIN   Take 1 tablet (500 mg total) by mouth every 6 (six) hours as needed.      multivitamin tablet   Take 1 tablet by mouth daily.      oxyCODONE-acetaminophen 5-325 MG per tablet   Commonly known as: PERCOCET   Take 1-2 tablets by mouth every 4 (four) hours as needed for pain.      rivaroxaban 10 MG Tabs tablet   Commonly known as: XARELTO   Take 1 tablet (10 mg total) by mouth daily with breakfast.            Diagnostic Studies: Dg Chest 2 View  11/17/2011  *RADIOLOGY REPORT*  Clinical Data: Preop radiograph  CHEST - 2 VIEW  Comparison: 05/17/2011  Findings: Heart size is normal.  No pleural effusion or pulmonary edema noted.  No airspace consolidation identified.  There is mild thoracic spondylosis.  IMPRESSION:  1.  No acute findings.  Original Report Authenticated By: Rosealee Albee, M.D.   X-ray Knee Right Port  11/19/2011  *RADIOLOGY REPORT*  Clinical Data: Postop  knee arthroplasty.  PORTABLE RIGHT KNEE - 1-2 VIEW  Comparison: None.  Findings: Two views of the knee demonstrate a total knee replacement.  There are anterior skin staples and a surgical drain in place.  The knee is located.  Small loose bodies are along the lateral aspect of the knee joint.  Expected soft tissue changes.  IMPRESSION: Right knee arthroplasty with expected postoperative changes.  Original Report Authenticated By: Richarda Overlie, M.D.    Disposition: Discharge to home      Signed: Kathryne Hitch 11/22/2011, 6:59 AM

## 2011-11-22 NOTE — Progress Notes (Signed)
Physical Therapy Treatment Patient Details Name: Franklin Jones MRN: [id number] DOB: 1951/02/17 Today's Date: 11/22/2011  R TKR POD #3 9:20 - 10:00 1 te  1 ta  1 gt  PT Assessment/Plan  PT - Assessment/Plan Comments on Treatment Session: PT has met all goals and ready for D/C to home.  Handouts given on HEP and stairs. PT Plan: Discharge plan remains appropriate Follow Up Recommendations: Home health PT Equipment Recommended: None recommended by PT PT Goals  Acute Rehab PT Goals PT Goal Formulation: With patient Pt will go Supine/Side to Sit: with supervision PT Goal: Supine/Side to Sit - Progress: Met Pt will go Sit to Supine/Side: with supervision PT Goal: Sit to Supine/Side - Progress: Met Pt will go Sit to Stand: with supervision PT Goal: Sit to Stand - Progress: Met Pt will go Stand to Sit: with supervision PT Goal: Stand to Sit - Progress: Met Pt will Ambulate: >150 feet;with supervision;with least restrictive assistive device PT Goal: Ambulate - Progress: Partly met Pt will Go Up / Down Stairs: 3-5 stairs;with least restrictive assistive device PT Goal: Up/Down Stairs - Progress: Met Pt will Perform Home Exercise Program: with supervision, verbal cues required/provided PT Goal: Perform Home Exercise Program - Progress: Met  PT Treatment Precautions/Restrictions  Precautions Precautions: Knee Precaution Comments: Pt and spouse instructed on KI use and when to D/C after 10 active SLR Required Braces or Orthoses: Yes Knee Immobilizer: Discontinue once straight leg raise with < 10 degree lag Restrictions Weight Bearing Restrictions: No RLE Weight Bearing: Weight bearing as tolerated Mobility (including Balance) Bed Mobility Bed Mobility: Yes Supine to Sit: 5: Supervision Supine to Sit Details (indicate cue type and reason): increased time and MinGuard Assist to support R LE on/off bed Sit to Supine: 5: Supervision Sit to Supine - Details (indicate cue type and  reason): increased time and MinGuard Assist to support R LE on/off bed Transfers Transfers: Yes Sit to Stand: 5: Supervision;From bed Sit to Stand Details (indicate cue type and reason): One VC on hand placement Stand to Sit: 5: Supervision;To bed Stand to Sit Details: one VC to extend R LE prior to sit Ambulation/Gait Ambulation/Gait: Yes Ambulation/Gait Assistance: 5: Supervision Ambulation/Gait Assistance Details (indicate cue type and reason): <25% VC's to increase heel stike and WB R LE during gait Ambulation Distance (Feet): 85 Feet Assistive device: Rolling walker Gait Pattern: Step-to pattern;Decreased stance time - right Gait velocity: <25% VC's with safety on backward gait and truns using RW Stairs: Yes Stairs Assistance:  (MinGuard Assist) Stairs Assistance Details (indicate cue type and reason): one initial VC on sequencing and safety Stair Management Technique: One rail Right;With crutches Number of Stairs: 4  Wheelchair Mobility Wheelchair Mobility: No    Exercise  Total Joint Exercises Ankle Circles/Pumps: AROM;Both;10 reps Quad Sets: AROM;Both;10 reps Gluteal Sets: AROM;Both;10 reps Towel Squeeze: AROM;Both;10 reps Short Arc QuadBarbaraann Boys;Right;10 reps Heel Slides: AAROM;Right;10 reps (demon and instructed pt how to use a bed sheer to assist) Hip ABduction/ADduction: AAROM;Right;10 reps Straight Leg Raises: AAROM;Right;10 reps End of Session PT - End of Session Equipment Utilized During Treatment: Gait belt Activity Tolerance: Patient tolerated treatment well Patient left: in bed;with call bell in reach;with family/visitor present Nurse Communication: Other (comment) (Pt completed PT session and ready for D/C to home) General Behavior During Session: [facility] for tasks performed Cognition: [facility] for tasks performed  Felecia Shelling  PTA [facility]  Acute  Rehab Pager     [phone]

## 2012-05-04 ENCOUNTER — Ambulatory Visit (INDEPENDENT_AMBULATORY_CARE_PROVIDER_SITE_OTHER): Payer: 59 | Admitting: Emergency Medicine

## 2012-05-04 ENCOUNTER — Encounter: Payer: Self-pay | Admitting: Emergency Medicine

## 2012-05-04 ENCOUNTER — Ambulatory Visit (INDEPENDENT_AMBULATORY_CARE_PROVIDER_SITE_OTHER)
Admission: RE | Admit: 2012-05-04 | Discharge: 2012-05-04 | Disposition: A | Discharge status: Home / Self Care | Payer: 59 | Source: Ambulatory Visit | Attending: Emergency Medicine | Admitting: Emergency Medicine

## 2012-05-04 VITALS — BP 138/80 | HR 92 | Temp 97.6°F | Ht 74.0 in | Wt 254.0 lb

## 2012-05-04 DIAGNOSIS — R0600 Dyspnea, unspecified: Secondary | ICD-10-CM

## 2012-05-04 DIAGNOSIS — R0609 Other forms of dyspnea: Secondary | ICD-10-CM

## 2012-05-04 DIAGNOSIS — Z77090 Contact with and (suspected) exposure to asbestos: Secondary | ICD-10-CM

## 2012-05-04 LAB — PULMONARY FUNCTION TEST

## 2012-05-04 NOTE — Progress Notes (Signed)
PFT done today. Katie Welchel,CMA  

## 2012-05-04 NOTE — Patient Instructions (Addendum)
Your PFT's and your CXR are stable We will plan to repeat your CT scan in June or July 2014.  We will repeat your PFT in July 2014 If you develop any new breathing problems, we may decide to do your testing earlier Follow with Dr Delton Coombes in July 2014 after the testing

## 2012-05-04 NOTE — Progress Notes (Signed)
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 Was discovered to have an abnormal CXR, around 2004. No other symptoms at that time. Over time he has developed some exertional dyspnea. Has never had a CT scan of the chest. He has done spirometry and CXR's   ROV 04/09/10 -- follows up to today to review Ct scan and PFT in setting of asbestos exposure. Other ROS - mentions sinus pain/pressure, nasal congestion, clear drainage.   ROV 04/28/11 -- follow up visit, hx asbestos exposure. Had PFT and CT scan today. 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 PFT as below. He has noticed a slight decrease in functional capacity - related this to knee pain and decreased activity level, decreased conditioning.   ROV 05/04/12 -- follow up visit, hx asbestos exposure. Had PFT and CT scan today. 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 Returns feeling well. PFT today are stable - FEV1 3.24L. No new symptoms.   Filed Vitals:   05/04/12 1617  BP: 138/80  Pulse: 92  Temp: 97.6 F (36.4 C)   Gen: Pleasant, well-nourished, in no distress,  normal affect  ENT: No lesions,  mouth clear,  oropharynx clear, no postnasal drip  Neck: No JVD, no TMG, no carotid bruits  Lungs: No use of accessory muscles, no dullness to percussion, clear without rales or rhonchi  Cardiovascular: RRR, heart sounds normal, no murmur or gallops, no peripheral edema  Musculoskeletal: No deformities, no cyanosis or clubbing  Neuro: alert, non focal  Skin: Warm, no lesions or rashes   PULMONARY FUNCTON TEST 04/09/2010 04/28/2011  FVC 4.98 4.51  FEV1 3.68 3.2  FEV1/FVC 73.9 71  FVC  % Predicted 92 87  FEV % Predicted 90 88  FeF 25-75 2.66 2.01  FeF 25-75 % Predicted 3.32 3.29     ASSESSMENT/PLAN: ASBESTOS EXPOSURE, HX OF Your PFT's and your CXR are stable We will plan to repeat your CT scan in June or July 2014.  We will repeat your PFT in July 2014 If you develop  any new breathing problems, we may decide to do your testing earlier Follow with Dr Delton Coombes in July 2014 after the testing

## 2012-05-04 NOTE — Assessment & Plan Note (Signed)
Your PFT's and your CXR are stable We will plan to repeat your CT scan in June or July 2014.  We will repeat your PFT in July 2014 If you develop any new breathing problems, we may decide to do your testing earlier Follow with Dr Stavros Cail in July 2014 after the testing 

## 2012-05-30 ENCOUNTER — Encounter: Payer: Self-pay | Admitting: Emergency Medicine

## 2012-07-04 ENCOUNTER — Other Ambulatory Visit (HOSPITAL_COMMUNITY): Payer: Self-pay | Admitting: Orthopaedic Surgery

## 2012-08-10 ENCOUNTER — Encounter (HOSPITAL_COMMUNITY): Payer: Self-pay | Admitting: Pharmacy Technician

## 2012-08-15 ENCOUNTER — Encounter (HOSPITAL_COMMUNITY)
Admission: RE | Admit: 2012-08-15 | Discharge: 2012-08-15 | Disposition: A | Discharge status: Home / Self Care | Payer: 59 | Source: Ambulatory Visit | Attending: Orthopaedic Surgery | Admitting: Orthopaedic Surgery

## 2012-08-15 ENCOUNTER — Encounter (HOSPITAL_COMMUNITY): Payer: Self-pay

## 2012-08-15 HISTORY — DX: Shortness of breath: R06.02

## 2012-08-15 LAB — CBC
HCT: 42.8 % (ref 39.0–52.0)
Hemoglobin: 14.9 g/dL (ref 13.0–17.0)
MCV: 89.7 fL (ref 78.0–100.0)
RDW: 12.9 % (ref 11.5–15.5)
WBC: 6.2 10*3/uL (ref 4.0–10.5)

## 2012-08-15 LAB — URINALYSIS, ROUTINE W REFLEX MICROSCOPIC
Bilirubin Urine: NEGATIVE
Glucose, UA: NEGATIVE mg/dL
Specific Gravity, Urine: 1.023 (ref 1.005–1.030)
pH: 5.5 (ref 5.0–8.0)

## 2012-08-15 LAB — PROTIME-INR
INR: 0.97 (ref 0.00–1.49)
Prothrombin Time: 12.8 seconds (ref 11.6–15.2)

## 2012-08-15 LAB — BASIC METABOLIC PANEL
BUN: 17 mg/dL (ref 6–23)
Calcium: 9.8 mg/dL (ref 8.4–10.5)
GFR calc Af Amer: 80 mL/min — ABNORMAL LOW (ref >90)
GFR calc non Af Amer: 69 mL/min — ABNORMAL LOW (ref >90)
Glucose, Bld: 93 mg/dL (ref 70–99)
Sodium: 139 mEq/L (ref 135–145)

## 2012-08-15 LAB — URINE MICROSCOPIC-ADD ON

## 2012-08-15 LAB — APTT: aPTT: 31 seconds (ref 24–37)

## 2012-08-15 LAB — SURGICAL PCR SCREEN
MRSA, PCR: NEGATIVE
Staphylococcus aureus: NEGATIVE

## 2012-08-15 NOTE — Patient Instructions (Addendum)
YOUR SURGERY IS SCHEDULED AT [facility]  ON:  Friday  11/1  AT 7:30 AM  REPORT TO [place] [facility] AT:  5:30 AM      PHONE # FOR SHORT STAY IS [phone]  DO NOT EAT OR DRINK ANYTHING AFTER MIDNIGHT THE NIGHT BEFORE YOUR SURGERY.  YOU MAY BRUSH YOUR TEETH, RINSE OUT YOUR MOUTH--BUT NO WATER, NO FOOD, NO CHEWING GUM, NO MINTS, NO CANDIES, NO CHEWING TOBACCO.  PLEASE TAKE THE FOLLOWING MEDICATIONS THE AM OF YOUR SURGERY WITH A FEW SIPS OF WATER:  NO MEDICINES TO TAKE   IF YOU USE INHALERS--USE YOUR INHALERS THE AM OF YOUR SURGERY AND BRING INHALERS TO THE HOSPITAL -TAKE TO SURGERY.    IF YOU ARE DIABETIC:  DO NOT TAKE ANY DIABETIC MEDICATIONS THE AM OF YOUR SURGERY.  IF YOU TAKE INSULIN IN THE EVENINGS--PLEASE ONLY TAKE 1/2 NORMAL EVENING DOSE THE NIGHT BEFORE YOUR SURGERY.  NO INSULIN THE AM OF YOUR SURGERY.  IF YOU HAVE SLEEP APNEA AND USE CPAP OR BIPAP--PLEASE BRING THE MASK AND THE TUBING.  DO NOT BRING YOUR MACHINE.  DO NOT BRING VALUABLES, MONEY, CREDIT CARDS.  DO NOT WEAR JEWELRY, MAKE-UP, NAIL POLISH AND NO METAL PINS OR CLIPS IN YOUR HAIR. CONTACT LENS, DENTURES / PARTIALS, GLASSES SHOULD NOT BE WORN TO SURGERY AND IN MOST CASES-HEARING AIDS WILL NEED TO BE REMOVED.  BRING YOUR GLASSES CASE, ANY EQUIPMENT NEEDED FOR YOUR CONTACT LENS. FOR PATIENTS ADMITTED TO THE HOSPITAL--CHECK OUT TIME THE DAY OF DISCHARGE IS 11:00 AM.  ALL INPATIENT ROOMS ARE PRIVATE - WITH BATHROOM, TELEPHONE, TELEVISION AND WIFI INTERNET.  IF YOU ARE BEING DISCHARGED THE SAME DAY OF YOUR SURGERY--YOU CAN NOT DRIVE YOURSELF HOME--AND SHOULD NOT GO HOME ALONE BY TAXI OR BUS.  NO DRIVING OR OPERATING MACHINERY FOR 24 HOURS FOLLOWING ANESTHESIA / PAIN MEDICATIONS.  PLEASE MAKE ARRANGEMENTS FOR SOMEONE TO BE WITH YOU AT HOME THE FIRST 24 HOURS AFTER SURGERY. RESPONSIBLE DRIVER'S NAME___________________________                                               PHONE #   _______________________                                 PLEASE READ OVER ANY  FACT SHEETS THAT YOU WERE GIVEN: MRSA INFORMATION, BLOOD TRANSFUSION INFORMATION

## 2012-08-15 NOTE — Pre-Procedure Instructions (Signed)
EKG REPORT 11/17/11 AND CXR REPORT 05/04/12 IN EPIC. CBC, BMET, PT, PTT, UA WERE DONE TODAY AT [facility].  PT WILL DO T/S DAY OF SURGERY--HAS TO WORK THIS WEEK AND STATES BLOOD BRACELET MIGHT GET CAUGHT ON SOMETHING AT WORK.

## 2012-08-18 ENCOUNTER — Encounter (HOSPITAL_COMMUNITY): Payer: Self-pay | Admitting: Anesthesiology

## 2012-08-18 ENCOUNTER — Inpatient Hospital Stay (HOSPITAL_COMMUNITY)
Admission: RE | Admit: 2012-08-18 | Discharge: 2012-08-21 | DRG: 470 | Disposition: A | Discharge status: Home Health | Payer: 59 | Source: Ambulatory Visit | Attending: Orthopaedic Surgery | Admitting: Orthopaedic Surgery

## 2012-08-18 ENCOUNTER — Encounter (HOSPITAL_COMMUNITY)
Admission: RE | Disposition: A | Discharge status: Home Health | Payer: Self-pay | Source: Ambulatory Visit | Attending: Orthopaedic Surgery

## 2012-08-18 ENCOUNTER — Ambulatory Visit (HOSPITAL_COMMUNITY): Payer: 59

## 2012-08-18 ENCOUNTER — Ambulatory Visit (HOSPITAL_COMMUNITY): Payer: 59 | Admitting: Anesthesiology

## 2012-08-18 ENCOUNTER — Encounter (HOSPITAL_COMMUNITY): Payer: Self-pay | Admitting: *Deleted

## 2012-08-18 DIAGNOSIS — M1710 Unilateral primary osteoarthritis, unspecified knee: Principal | ICD-10-CM | POA: Diagnosis present

## 2012-08-18 DIAGNOSIS — M1712 Unilateral primary osteoarthritis, left knee: Secondary | ICD-10-CM

## 2012-08-18 DIAGNOSIS — Z01812 Encounter for preprocedural laboratory examination: Secondary | ICD-10-CM

## 2012-08-18 HISTORY — PX: TOTAL KNEE ARTHROPLASTY: SHX125

## 2012-08-18 LAB — TYPE AND SCREEN: Antibody Screen: NEGATIVE

## 2012-08-18 SURGERY — ARTHROPLASTY, KNEE, TOTAL
Anesthesia: General | Site: Knee | Laterality: Left | Wound class: Clean

## 2012-08-18 MED ORDER — ONDANSETRON HCL 4 MG PO TABS: 4.0000 mg | ORAL_TABLET | Freq: Four times a day (QID) | ORAL | Status: DC | PRN

## 2012-08-18 MED ORDER — BUPIVACAINE IN DEXTROSE 0.75-8.25 % IT SOLN
INTRATHECAL | Status: DC | PRN
  Administered 2012-08-18: 2 mL via INTRATHECAL

## 2012-08-18 MED ORDER — HYDROMORPHONE HCL PF 1 MG/ML IJ SOLN
1.0000 mg | INTRAMUSCULAR | Status: DC | PRN
  Administered 2012-08-18 (×2): 1 mg via INTRAVENOUS
  Filled 2012-08-18 (×3): qty 1

## 2012-08-18 MED ORDER — RIVAROXABAN 10 MG PO TABS
10.0000 mg | ORAL_TABLET | Freq: Every day | ORAL | Status: DC
  Filled 2012-08-18: qty 1

## 2012-08-18 MED ORDER — FENTANYL CITRATE 0.05 MG/ML IJ SOLN
INTRAMUSCULAR | Status: DC | PRN
Start: 2012-08-18 — End: 2012-08-18
  Administered 2012-08-18: 50 ug via INTRAVENOUS

## 2012-08-18 MED ORDER — ONDANSETRON HCL 4 MG/2ML IJ SOLN: 4.0000 mg | Freq: Four times a day (QID) | INTRAMUSCULAR | Status: DC | PRN

## 2012-08-18 MED ORDER — ACETAMINOPHEN 650 MG RE SUPP: 650.0000 mg | Freq: Four times a day (QID) | RECTAL | Status: DC | PRN

## 2012-08-18 MED ORDER — MEPERIDINE HCL 50 MG/ML IJ SOLN: 6.2500 mg | INTRAMUSCULAR | Status: DC | PRN

## 2012-08-18 MED ORDER — RIVAROXABAN 10 MG PO TABS
10.0000 mg | ORAL_TABLET | Freq: Every day | ORAL | Status: DC
  Administered 2012-08-19 – 2012-08-21 (×3): 10 mg via ORAL
  Filled 2012-08-18 (×4): qty 1

## 2012-08-18 MED ORDER — ACETAMINOPHEN 10 MG/ML IV SOLN
INTRAVENOUS | Status: AC
  Filled 2012-08-18: qty 100

## 2012-08-18 MED ORDER — SODIUM CHLORIDE 0.9 % IR SOLN
Status: DC | PRN
  Administered 2012-08-18: 1000 mL

## 2012-08-18 MED ORDER — PROPOFOL 10 MG/ML IV BOLUS
INTRAVENOUS | Status: DC | PRN
  Administered 2012-08-18: 30 mg via INTRAVENOUS

## 2012-08-18 MED ORDER — ACETAMINOPHEN 10 MG/ML IV SOLN
INTRAVENOUS | Status: DC | PRN
  Administered 2012-08-18: 1000 mg via INTRAVENOUS

## 2012-08-18 MED ORDER — PROPOFOL INFUSION 10 MG/ML OPTIME
INTRAVENOUS | Status: DC | PRN
  Administered 2012-08-18: 160 ug/kg/min via INTRAVENOUS

## 2012-08-18 MED ORDER — CLINDAMYCIN PHOSPHATE 600 MG/50ML IV SOLN
600.0000 mg | Freq: Four times a day (QID) | INTRAVENOUS | Status: AC
  Administered 2012-08-18 (×2): 600 mg via INTRAVENOUS
  Filled 2012-08-18 (×2): qty 50

## 2012-08-18 MED ORDER — OXYCODONE HCL 5 MG PO TABS
5.0000 mg | ORAL_TABLET | ORAL | Status: DC | PRN
  Administered 2012-08-18 – 2012-08-21 (×19): 10 mg via ORAL
  Filled 2012-08-18 (×19): qty 2

## 2012-08-18 MED ORDER — LACTATED RINGERS IV SOLN: INTRAVENOUS | Status: DC

## 2012-08-18 MED ORDER — OXYCODONE HCL ER 20 MG PO T12A
20.0000 mg | EXTENDED_RELEASE_TABLET | Freq: Two times a day (BID) | ORAL | Status: DC
  Administered 2012-08-18 – 2012-08-21 (×5): 20 mg via ORAL
  Filled 2012-08-18 (×5): qty 1

## 2012-08-18 MED ORDER — SODIUM CHLORIDE 0.9 % IV SOLN
INTRAVENOUS | Status: DC
  Administered 2012-08-19: 02:00:00 via INTRAVENOUS

## 2012-08-18 MED ORDER — KETOROLAC TROMETHAMINE 15 MG/ML IJ SOLN
15.0000 mg | Freq: Four times a day (QID) | INTRAMUSCULAR | Status: AC
  Administered 2012-08-18 – 2012-08-19 (×4): 15 mg via INTRAVENOUS
  Filled 2012-08-18 (×4): qty 1

## 2012-08-18 MED ORDER — PHENYLEPHRINE HCL 10 MG/ML IJ SOLN
INTRAMUSCULAR | Status: DC | PRN
  Administered 2012-08-18: 50 ug via INTRAVENOUS

## 2012-08-18 MED ORDER — FERROUS SULFATE 325 (65 FE) MG PO TABS
325.0000 mg | ORAL_TABLET | Freq: Three times a day (TID) | ORAL | Status: DC
  Administered 2012-08-18 – 2012-08-21 (×8): 325 mg via ORAL
  Filled 2012-08-18 (×12): qty 1

## 2012-08-18 MED ORDER — PROMETHAZINE HCL 25 MG/ML IJ SOLN: 6.2500 mg | INTRAMUSCULAR | Status: DC | PRN

## 2012-08-18 MED ORDER — HYDROMORPHONE HCL PF 1 MG/ML IJ SOLN: 0.2500 mg | INTRAMUSCULAR | Status: DC | PRN

## 2012-08-18 MED ORDER — METOCLOPRAMIDE HCL 5 MG/ML IJ SOLN: 5.0000 mg | Freq: Three times a day (TID) | INTRAMUSCULAR | Status: DC | PRN

## 2012-08-18 MED ORDER — KETOROLAC TROMETHAMINE 15 MG/ML IJ SOLN
INTRAMUSCULAR | Status: AC
  Filled 2012-08-18: qty 1

## 2012-08-18 MED ORDER — METOCLOPRAMIDE HCL 10 MG PO TABS: 5.0000 mg | ORAL_TABLET | Freq: Three times a day (TID) | ORAL | Status: DC | PRN

## 2012-08-18 MED ORDER — LACTATED RINGERS IV SOLN
INTRAVENOUS | Status: DC | PRN
  Administered 2012-08-18 (×4): via INTRAVENOUS

## 2012-08-18 MED ORDER — CLINDAMYCIN PHOSPHATE 900 MG/50ML IV SOLN
900.0000 mg | INTRAVENOUS | Status: AC
  Administered 2012-08-18: 900 mg via INTRAVENOUS

## 2012-08-18 MED ORDER — ACETAMINOPHEN 325 MG PO TABS: 650.0000 mg | ORAL_TABLET | Freq: Four times a day (QID) | ORAL | Status: DC | PRN

## 2012-08-18 MED ORDER — ROPIVACAINE HCL 5 MG/ML IJ SOLN
INTRAMUSCULAR | Status: AC
  Filled 2012-08-18: qty 30

## 2012-08-18 MED ORDER — MIDAZOLAM HCL 5 MG/5ML IJ SOLN
INTRAMUSCULAR | Status: DC | PRN
  Administered 2012-08-18: 2 mg via INTRAVENOUS

## 2012-08-18 MED ORDER — CLINDAMYCIN PHOSPHATE 900 MG/50ML IV SOLN
INTRAVENOUS | Status: AC
  Filled 2012-08-18: qty 50

## 2012-08-18 MED ORDER — PHENOL 1.4 % MT LIQD
1.0000 | OROMUCOSAL | Status: DC | PRN
  Filled 2012-08-18: qty 177

## 2012-08-18 MED ORDER — MENTHOL 3 MG MT LOZG
1.0000 | LOZENGE | OROMUCOSAL | Status: DC | PRN
  Filled 2012-08-18: qty 9

## 2012-08-18 MED ORDER — EPHEDRINE SULFATE 50 MG/ML IJ SOLN
INTRAMUSCULAR | Status: DC | PRN
  Administered 2012-08-18: 7.5 mg via INTRAVENOUS
  Administered 2012-08-18: 5 mg via INTRAVENOUS

## 2012-08-18 MED ORDER — ALUM & MAG HYDROXIDE-SIMETH 200-200-20 MG/5ML PO SUSP
30.0000 mL | ORAL | Status: DC | PRN
  Administered 2012-08-18: 30 mL via ORAL
  Filled 2012-08-18: qty 30

## 2012-08-18 SURGICAL SUPPLY — 64 items
BAG SPEC THK2 15X12 ZIP CLS (MISCELLANEOUS) ×1
BAG ZIPLOCK 12X15 (MISCELLANEOUS) ×2 IMPLANT
BANDAGE ELASTIC 6 VELCRO ST LF (GAUZE/BANDAGES/DRESSINGS) ×2 IMPLANT
BANDAGE ESMARK 6X9 LF (GAUZE/BANDAGES/DRESSINGS) ×1 IMPLANT
BLADE SAG 18X100X1.27 (BLADE) ×2 IMPLANT
BLADE SAW SGTL 13.0X1.19X90.0M (BLADE) ×2 IMPLANT
BNDG CMPR 9X6 STRL LF SNTH (GAUZE/BANDAGES/DRESSINGS) ×1
BNDG ESMARK 6X9 LF (GAUZE/BANDAGES/DRESSINGS) ×2
BOWL SMART MIX CTS (DISPOSABLE) ×2 IMPLANT
CEMENT HV SMART SET (Cement) ×4 IMPLANT
CLOTH BEACON ORANGE TIMEOUT ST (SAFETY) ×2 IMPLANT
CUFF TOURN SGL QUICK 34 (TOURNIQUET CUFF) ×2
CUFF TRNQT CYL 34X4X40X1 (TOURNIQUET CUFF) ×1 IMPLANT
DRAPE EXTREMITY T 121X128X90 (DRAPE) ×2 IMPLANT
DRAPE LG THREE QUARTER DISP (DRAPES) ×2 IMPLANT
DRAPE POUCH INSTRU U-SHP 10X18 (DRAPES) ×2 IMPLANT
DRAPE U-SHAPE 47X51 STRL (DRAPES) ×2 IMPLANT
DRSG PAD ABDOMINAL 8X10 ST (GAUZE/BANDAGES/DRESSINGS) ×2 IMPLANT
DURAPREP 26ML APPLICATOR (WOUND CARE) ×2 IMPLANT
ELECT REM PT RETURN 9FT ADLT (ELECTROSURGICAL) ×2
ELECTRODE REM PT RTRN 9FT ADLT (ELECTROSURGICAL) ×1 IMPLANT
EVACUATOR 1/8 PVC DRAIN (DRAIN) ×1 IMPLANT
FACESHIELD LNG OPTICON STERILE (SAFETY) ×10 IMPLANT
GAUZE XEROFORM 5X9 LF (GAUZE/BANDAGES/DRESSINGS) ×2 IMPLANT
GLOVE BIO SURGEON STRL SZ7.5 (GLOVE) ×3 IMPLANT
GLOVE BIOGEL PI IND STRL 6.5 (GLOVE) IMPLANT
GLOVE BIOGEL PI IND STRL 7.5 (GLOVE) ×1 IMPLANT
GLOVE BIOGEL PI IND STRL 8 (GLOVE) ×1 IMPLANT
GLOVE BIOGEL PI INDICATOR 6.5 (GLOVE) ×2
GLOVE BIOGEL PI INDICATOR 7.5 (GLOVE)
GLOVE BIOGEL PI INDICATOR 8 (GLOVE) ×1
GLOVE ECLIPSE 6.5 STRL STRAW (GLOVE) ×1 IMPLANT
GLOVE ECLIPSE 7.0 STRL STRAW (GLOVE) ×1 IMPLANT
GLOVE SURG SS PI 6.5 STRL IVOR (GLOVE) ×1 IMPLANT
GLOVE SURG SS PI 7.5 STRL IVOR (GLOVE) ×5 IMPLANT
GOWN STRL NON-REIN LRG LVL3 (GOWN DISPOSABLE) ×1 IMPLANT
GOWN STRL REIN XL XLG (GOWN DISPOSABLE) ×6 IMPLANT
HANDPIECE INTERPULSE COAX TIP (DISPOSABLE) ×2
IMMOBILIZER KNEE 20 (SOFTGOODS) ×2
IMMOBILIZER KNEE 20 THIGH 36 (SOFTGOODS) IMPLANT
KIT BASIN OR (CUSTOM PROCEDURE TRAY) ×2 IMPLANT
NS IRRIG 1000ML POUR BTL (IV SOLUTION) ×2 IMPLANT
PACK TOTAL JOINT (CUSTOM PROCEDURE TRAY) ×2 IMPLANT
PADDING CAST COTTON 6X4 STRL (CAST SUPPLIES) ×3 IMPLANT
POSITIONER SURGICAL ARM (MISCELLANEOUS) ×2 IMPLANT
SET HNDPC FAN SPRY TIP SCT (DISPOSABLE) ×1 IMPLANT
SET PAD KNEE POSITIONER (MISCELLANEOUS) ×2 IMPLANT
SPONGE GAUZE 4X4 12PLY (GAUZE/BANDAGES/DRESSINGS) ×2 IMPLANT
SPONGE LAP 18X18 X RAY DECT (DISPOSABLE) ×1 IMPLANT
STAPLER VISISTAT 35W (STAPLE) ×2 IMPLANT
SUCTION FRAZIER 12FR DISP (SUCTIONS) ×1 IMPLANT
SUT VIC AB 0 CT1 27 (SUTURE) ×2
SUT VIC AB 0 CT1 27XBRD ANTBC (SUTURE) ×2 IMPLANT
SUT VIC AB 1 CT1 27 (SUTURE) ×2
SUT VIC AB 1 CT1 27XBRD ANTBC (SUTURE) ×3 IMPLANT
SUT VIC AB 1 CT1 36 (SUTURE) ×1 IMPLANT
SUT VIC AB 2-0 CT1 27 (SUTURE) ×2
SUT VIC AB 2-0 CT1 TAPERPNT 27 (SUTURE) ×2 IMPLANT
SUT VLOC 180 0 24IN GS25 (SUTURE) ×2 IMPLANT
TOWEL OR 17X26 10 PK STRL BLUE (TOWEL DISPOSABLE) ×4 IMPLANT
TOWEL OR NON WOVEN STRL DISP B (DISPOSABLE) ×2 IMPLANT
TRAY FOLEY CATH 14FRSI W/METER (CATHETERS) ×2 IMPLANT
WATER STERILE IRR 1500ML POUR (IV SOLUTION) ×2 IMPLANT
WRAP KNEE MAXI GEL POST OP (GAUZE/BANDAGES/DRESSINGS) ×2 IMPLANT

## 2012-08-18 NOTE — H&P (Signed)
TOTAL KNEE ADMISSION H&P  Patient is being admitted for left total knee arthroplasty.  Subjective:  Chief Complaint:left knee pain.  HPI: Franklin Jones, 61 y.o. male, has a history of pain and functional disability in the left knee due to arthritis and has failed non-surgical conservative treatments for greater than 12 weeks to includeNSAID's and/or analgesics, corticosteriod injections and activity modification.  Onset of symptoms was gradual, starting 6 years ago with gradually worsening course since that time. The patient noted no past surgery on the left knee(s).  Patient currently rates pain in the left knee(s) at 7 out of 10 with activity. Patient has night pain, worsening of pain with activity and weight bearing, pain that interferes with activities of daily living, pain with passive range of motion, crepitus and joint swelling.  Patient has evidence of subchondral sclerosis, periarticular osteophytes and joint space narrowing by imaging studies. There is no active infection.  Patient Active Problem List   Diagnosis Date Noted  . Arthritis of knee, left 08/18/2012  . Degenerative arthritis of knee 11/19/2011  . ALLERGIC RHINITIS 04/09/2010  . Nonspecific (abnormal) findings on radiological and other examination of body structure 04/09/2010  . CT, CHEST, ABNORMAL 04/09/2010  . TOBACCO ABUSE, HX OF 03/26/2010  . ASBESTOS EXPOSURE, HX OF 03/26/2010   Past Medical History  Diagnosis Date  . Allergic rhinitis   . History of asbestos exposure     followed by Dr Ortencia Kick- states stable years- last CT chest 7/12 in Epic  . Arthritis   . Degenerative arthritis of knee 11/19/2011  . Shortness of breath     SOME SOB BUT CAN CLIMB FLIGHT OF STAIRS    Past Surgical History  Procedure Date  . Left finger amputation   . Fracture surgery     traumatic injury left small finger with amputation  . Knee arthroplasty 11/19/2011    Procedure: COMPUTER ASSISTED TOTAL KNEE ARTHROPLASTY;  Surgeon:  Kathryne Hitch, MD;  Location: [facility] ORS;  Service: Orthopedics;  Laterality: Right;  Right Total Knee Arthroplasty    Prescriptions prior to admission  Medication Sig Dispense Refill  . acetaminophen (TYLENOL) 500 MG tablet Take 1,000 mg by mouth every 6 (six) hours as needed.      [name] Kitchen aspirin 325 MG tablet Take 325 mg by mouth daily.      . Glucosamine-Chondroit-Vit C-Mn (GLUCOSAMINE CHONDR 1500 COMPLX) CAPS Take 2 capsules by mouth daily.       . Multiple Vitamin (MULTIVITAMIN) tablet Take 1 tablet by mouth daily.       . naproxen (NAPROSYN) 500 MG tablet Take 500 mg by mouth daily.      . Omega-3 Fatty Acids (FISH OIL) 600 MG CAPS Take 1 capsule by mouth daily.       Allergies  Allergen Reactions  . Penicillins     REACTION: rash    History  Substance Use Topics  . Smoking status: Former Smoker -- 1.0 packs/day for 20 years    Types: Cigarettes    Quit date: 10/18/1985  . Smokeless tobacco: Never Used  . Alcohol Use: 3.0 oz/week    6 drink(s) per week    Family History  Problem Relation Age of Onset  . Allergies Mother   . Heart disease Father   . Lung cancer Brother      Review of Systems  Musculoskeletal: Positive for joint pain.  All other systems reviewed and are negative.    Objective:  Physical Exam  Constitutional: He is  oriented to person, place, and time. He appears well-developed and well-nourished.  HENT:  Head: Normocephalic and atraumatic.  Eyes: EOM are normal. Pupils are equal, round, and reactive to light.  Neck: Normal range of motion. Neck supple.  Cardiovascular: Normal rate and regular rhythm.   Respiratory: Effort normal and breath sounds normal.  GI: Soft. Bowel sounds are normal.  Musculoskeletal:       Left knee: He exhibits effusion, abnormal alignment and bony tenderness. tenderness found. Medial joint line and lateral joint line tenderness noted.  Neurological: He is alert and oriented to person, place, and time.  Skin: Skin is  warm and dry.  Psychiatric: He has a normal mood and affect.    Vital signs in last 24 hours: Temp:  [96.7 F (35.9 C)] 96.7 F (35.9 C) (11/01 0529) Pulse Rate:  [93] 93  (11/01 0529) Resp:  [18] 18  (11/01 0529) BP: (147)/(99) 147/99 mmHg (11/01 0529) SpO2:  [97 %] 97 % (11/01 0529)  Labs:   Estimated Body mass index is 32.61 kg/(m^2) as calculated from the following:   Height as of 05/04/12: 6\' 2" (1.88 m).   Weight as of 05/04/12: 254 lb(115.214 kg).   Imaging Review Plain radiographs demonstrate severe degenerative joint disease of the left knee(s). The overall alignment is in significant varus.. The bone quality appears to be excellent for age and reported activity level.  Assessment/Plan:  End stage arthritis, left knee   The patient history, physical examination, clinical judgment of the provider and imaging studies are consistent with end stage degenerative joint disease of the left knee(s) and total knee arthroplasty is deemed medically necessary. The treatment options including medical management, injection therapy arthroscopy and arthroplasty were discussed at length. The risks and benefits of total knee arthroplasty were presented and reviewed. The risks due to aseptic loosening, infection, stiffness, patella tracking problems, thromboembolic complications and other imponderables were discussed. The patient acknowledged the explanation, agreed to proceed with the plan and consent was signed. Patient is being admitted for inpatient treatment for surgery, pain control, PT, OT, prophylactic antibiotics, VTE prophylaxis, progressive ambulation and ADL's and discharge planning. The patient is planning to be discharged home with home health services

## 2012-08-18 NOTE — Anesthesia Preprocedure Evaluation (Addendum)
Anesthesia Evaluation  Patient identified by MRN, date of birth, ID band Patient awake    Reviewed: Allergy & Precautions, H&P , NPO status , Patient's Chart, lab work & pertinent test results  Airway Mallampati: II TM Distance: >3 FB Neck ROM: Full    Dental No notable dental hx.    Pulmonary neg pulmonary ROS,  breath sounds clear to auscultation  Pulmonary exam normal       Cardiovascular negative cardio ROS  Rhythm:Regular Rate:Normal     Neuro/Psych negative neurological ROS  negative psych ROS   GI/Hepatic negative GI ROS, Neg liver ROS,   Endo/Other  negative endocrine ROS  Renal/GU negative Renal ROS  negative genitourinary   Musculoskeletal negative musculoskeletal ROS (+)   Abdominal   Peds negative pediatric ROS (+)  Hematology negative hematology ROS (+)   Anesthesia Other Findings Upper front cap  Reproductive/Obstetrics negative OB ROS                           Anesthesia Physical Anesthesia Plan  ASA: II  Anesthesia Plan: General   Post-op Pain Management:    Induction: Intravenous  Airway Management Planned: Oral ETT  Additional Equipment:   Intra-op Plan:   Post-operative Plan: Extubation in OR  Informed Consent: I have reviewed the patients History and Physical, chart, labs and discussed the procedure including the risks, benefits and alternatives for the proposed anesthesia with the patient or authorized representative who has indicated his/her understanding and acceptance.   Dental advisory given  Plan Discussed with: CRNA  Anesthesia Plan Comments: (FNB)        Anesthesia Quick Evaluation                                   Anesthesia Evaluation  Patient identified by MRN, date of birth, ID band Patient awake    Reviewed: Allergy & Precautions, H&P , NPO status , Patient's Chart, lab work & pertinent test results  Airway Mallampati: II TM  Distance: >3 FB Neck ROM: full    Dental No notable dental hx.    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  Pulmonary exam normal       Cardiovascular Exercise Tolerance: Good neg cardio ROS regular Normal    Neuro/Psych Negative Neurological ROS  Negative Psych ROS   GI/Hepatic negative GI ROS, Neg liver ROS,   Endo/Other  Negative Endocrine ROS  Renal/GU negative Renal ROS  Genitourinary negative   Musculoskeletal   Abdominal   Peds  Hematology negative hematology ROS (+)   Anesthesia Other Findings   Reproductive/Obstetrics negative OB ROS                           Anesthesia Physical Anesthesia Plan  ASA: II  Anesthesia Plan: General   Post-op Pain Management:    Induction:   Airway Management Planned:   Additional Equipment:   Intra-op Plan:   Post-operative Plan:   Informed Consent: I have reviewed the patients History and Physical, chart, labs and discussed the procedure including the risks, benefits and alternatives for the proposed anesthesia with the patient or authorized representative who has indicated his/her understanding and acceptance.   Dental Advisory Given  Plan Discussed with: CRNA  Anesthesia Plan Comments:         Anesthesia Quick Evaluation

## 2012-08-18 NOTE — Transfer of Care (Signed)
Immediate Anesthesia Transfer of Care Note  Patient: Franklin Jones  Procedure(s) Performed: Procedure(s) (LRB) with comments: TOTAL KNEE ARTHROPLASTY (Left) - Left Total Knee Arthroplasty  Patient Location: PACU  Anesthesia Type:Regional and Spinal  Level of Consciousness: awake, sedated and patient cooperative  Airway & Oxygen Therapy: Patient Spontanous Breathing and Patient connected to face mask oxygen  Post-op Assessment: Report given to PACU RN and Post -op Vital signs reviewed and stable  Post vital signs: Reviewed and stable  Complications: No apparent anesthesia complications

## 2012-08-18 NOTE — Brief Op Note (Signed)
08/18/2012  9:46 AM  PATIENT:  Franklin Jones  61 y.o. male  PRE-OPERATIVE DIAGNOSIS:  Severe osteoarthritis left knee  POST-OPERATIVE DIAGNOSIS:  Severe osteoarthritis left knee  PROCEDURE:  Procedure(s) (LRB) with comments: TOTAL KNEE ARTHROPLASTY (Left) - Left Total Knee Arthroplasty  SURGEON:  Surgeon(s) and Role:    * Kathryne Hitch, MD - Primary  PHYSICIAN ASSISTANT:   ASSISTANTS: [facility] OR staff   ANESTHESIA:   spinal and IV sedation  EBL:  Total I/O In: 3000 [I.V.:3000] Out: -   BLOOD ADMINISTERED:none  DRAINS: (medium) Hemovact drain(s) in the knee joint with  Suction Open   LOCAL MEDICATIONS USED:  NONE  SPECIMEN:  No Specimen  DISPOSITION OF SPECIMEN:  N/A  COUNTS:  YES  TOURNIQUET:   Total Tourniquet Time Documented: Thigh (Left) - 70 minutes  DICTATION: .Other Dictation: Dictation Number [id number]  PLAN OF CARE: Admit to inpatient   PATIENT DISPOSITION:  PACU - hemodynamically stable.   Delay start of Pharmacological VTE agent (>24hrs) due to surgical blood loss or risk of bleeding: no

## 2012-08-18 NOTE — Evaluation (Signed)
Physical Therapy Evaluation Patient Details Name: Franklin Jones MRN: [id number] DOB: 10-Oct-1951 Today's Date: 08/18/2012 Time: 4098-1191 PT Time Calculation (min): 21 min  PT Assessment / Plan / Recommendation Clinical Impression  61 yo male s/p L TKA. Mobilizing well POD 0. Recommend HHPT     PT Assessment  Patient needs continued PT services    Follow Up Recommendations  Home health PT    Does the patient have the potential to tolerate intense rehabilitation      Barriers to Discharge        Equipment Recommendations  None recommended by PT    Recommendations for Other Services     Frequency 7X/week    Precautions / Restrictions Precautions Precautions: Knee;Fall Restrictions Weight Bearing Restrictions: No LLE Weight Bearing: Weight bearing as tolerated   Pertinent Vitals/Pain 5/10 L knee      Mobility  Bed Mobility Bed Mobility: Supine to Sit Supine to Sit: 4: Min assist Details for Bed Mobility Assistance: Assist for L LE off bed. VCs safety.  Transfers Transfers: Sit to Stand;Stand to Sit Sit to Stand: 4: Min assist;From bed;From elevated surface Stand to Sit: 4: Min assist;To chair/3-in-1;With armrests Details for Transfer Assistance: VCs safety, technique, hand placement. Assist to rise, stabiilze, control descent.  Ambulation/Gait Ambulation/Gait Assistance: 4: Min assist Ambulation Distance (Feet): 60 Feet Assistive device: Rolling walker Ambulation/Gait Assistance Details: Assist to stabilize. VCs safety, sequence.  Gait Pattern: Step-to pattern;Decreased stride length;Antalgic    Shoulder Instructions     Exercises     PT Diagnosis: Difficulty walking;Abnormality of gait;Acute pain  PT Problem List: Decreased strength;Decreased range of motion;Decreased mobility;Pain;Decreased knowledge of use of DME PT Treatment Interventions: DME instruction;Gait training;Stair training;Functional mobility training;Therapeutic activities;Therapeutic  exercise;Patient/family education   PT Goals Acute Rehab PT Goals PT Goal Formulation: With patient Time For Goal Achievement: 08/25/12 Potential to Achieve Goals: Good Pt will go Supine/Side to Sit: with supervision PT Goal: Supine/Side to Sit - Progress: Goal set today Pt will go Sit to Supine/Side: with supervision PT Goal: Sit to Supine/Side - Progress: Goal set today Pt will go Sit to Stand: with supervision PT Goal: Sit to Stand - Progress: Goal set today Pt will Ambulate: 51 - 150 feet;with supervision;with least restrictive assistive device PT Goal: Ambulate - Progress: Goal set today Pt will Go Up / Down Stairs: 3-5 stairs;with rail(s);with least restrictive assistive device PT Goal: Up/Down Stairs - Progress: Goal set today Pt will Perform Home Exercise Program: with supervision, verbal cues required/provided PT Goal: Perform Home Exercise Program - Progress: Goal set today  Visit Information  Last PT Received On: 08/18/12 Assistance Needed: +1    Subjective Data  Subjective: "Okay, I'm ready" Patient Stated Goal: Home   Prior Functioning  Home Living Lives With: Spouse Available Help at Discharge: Family Type of Home: House Home Access: Stairs to enter [employer] of Steps: 5 Entrance Stairs-Rails: Right Home Layout: Two level;Able to live on main level with bedroom/bathroom Bathroom Shower/Tub: Engineer, manufacturing systems: Standard Home Adaptive Equipment: Walker - rolling;Straight cane;Tub transfer bench;Bedside commode/3-in-1 Prior Function Level of Independence: Independent Able to Take Stairs?: Yes Driving: Yes Communication Communication: No difficulties    Cognition  Overall Cognitive Status: Appears within functional limits for tasks assessed/performed Arousal/Alertness: Awake/alert Orientation Level: Appears intact for tasks assessed Behavior During Session: [facility] for tasks performed    Extremity/Trunk Assessment Right Lower  Extremity Assessment RLE ROM/Strength/Tone: [facility] for tasks assessed Left Lower Extremity Assessment LLE ROM/Strength/Tone: Deficits LLE ROM/Strength/Tone Deficits:  SLR 3/5, moves ankle well LLE Sensation: WFL - Light Touch Trunk Assessment Trunk Assessment: Normal   Balance    End of Session PT - End of Session Equipment Utilized During Treatment: Gait belt Activity Tolerance: Patient tolerated treatment well Patient left: in chair;with call bell/phone within reach;with family/visitor present CPM Left Knee CPM Left Knee: Off  GP     Rebeca Alert [facility] 08/18/2012, 4:15 PM [phone]

## 2012-08-18 NOTE — Anesthesia Postprocedure Evaluation (Signed)
  Anesthesia Post-op Note  Patient: Franklin Jones  Procedure(s) Performed: Procedure(s) (LRB): TOTAL KNEE ARTHROPLASTY (Left)  Patient Location: PACU  Anesthesia Type: Spinal  Level of Consciousness: awake and alert   Airway and Oxygen Therapy: Patient Spontanous Breathing  Post-op Pain: mild  Post-op Assessment: Post-op Vital signs reviewed, Patient's Cardiovascular Status Stable, Respiratory Function Stable, Patent Airway and No signs of Nausea or vomiting  Post-op Vital Signs: stable  Complications: No apparent anesthesia complications

## 2012-08-18 NOTE — Op Note (Signed)
NAMEMarland Kitchen  Franklin Jones, Franklin Jones NO.:  [id number]  MEDICAL RECORD NO.:  [id number]  LOCATION:  1619                         FACILITY:  [facility]  PHYSICIAN:  Vanita Panda. Magnus Ivan, M.D.DATE OF BIRTH:  11/22/1950  DATE OF PROCEDURE:  08/18/2012 DATE OF DISCHARGE:                              OPERATIVE REPORT   PREOPERATIVE DIAGNOSIS:  Severe end-stage arthritis and varus deformity, left knee.  POSTOPERATIVE DIAGNOSIS:  Severe end-stage arthritis and varus deformity, left knee.  PROCEDURE:  Left total knee arthroplasty.  IMPLANTS:  DePuy PFC Sigma rotating platform knee with size 5 femur, size 4 tibial tray, 12.5 polyethylene insert, 38 patellar button.  SURGEON:  Vanita Panda. Magnus Ivan, M.D.  ASSISTANT:  [facility] OR staff.  ANESTHESIA: 1. Spinal. 2. Mask ventilation of IV sedation.  TOURNIQUET TIME:  Under 2 hours.  BLOOD LOSS:  Under 200 mL.  ANTIBIOTICS:  900 mg of IV Cleocin.  COMPLICATIONS:  None.  INDICATIONS:  Franklin Jones is a well known gentleman to me, he is 61 years old with end-stage arthritis involving his left knee.  X-ray shows significant varus deformity with bone-on-bone sclerotic changes in the subcortical areas as well as cystic changes.  He has had a large recurrent effusion as well.  Several years ago, he underwent a successful right total knee arthroplasty.  Now, he wished to proceed with a left total knee arthroplasty given the failure of conservative treatment including injections, aspirations, anti-inflammatories, rest and time.  This greatly affected his activities of daily living and mobility, and his pain is daily.  PROCEDURE DESCRIPTION:  After informed consent was obtained, appropriate left knee was marked.  He was brought to the operating room and spinal anesthesia was obtained.  He was laid in supine position and mask ventilation IV sedation was obtained as well.  Nonsterile tourniquet was placed around his upper left leg  and his left leg was prepped and draped with DuraPrep and sterile drapes including sterile stockinette.  Time- out was called to identify the correct patient, correct left knee.  I then used an Esmarch to wrap out the leg and tourniquet was inflated to 300 mm of pressure.  I then made a midline incision directly over the knee and carried this proximally and distally.  I dissected down to the knee joint and performed a medial parapatellar arthrotomy.  There was a large effusion drained from the knee and when we flexed the knee, we removed remnants of the medial and lateral meniscus, PCL, ACL and fat pad behind the knee.  There was large amount of osteophytes throughout the knee and complete loss of cartilage especially at the patellofemoral joint in the medial compartment with varus deformity.  I released tissue around the medial tibial plateau and then was able to use my tibial cutting guide with the knee flexed, and I took 10 mm off the high side with setting my varus, valgus and my 4-0 slope.  Once I made my tibial cut, I turned to the femur.  I set my femoral cutting guide at 5 degrees externally rotated, 10-mm resection on the left knee.  I made my distal femoral cut as well.  When I  brought the knee back down in extension with a 12.5-mm block, I was pleased at his extension.  I then used my rotation guide and my sizing guide for choosing a size 5 femur.  Based off of this, I set my rotation and then made my anterior and posterior cuts as well as my chamfer cuts.  I then made my femoral box cut. Attention was turned back all for size 5 femur.  I went back to the tibia then and chose a size 4 tibial tray.  I made my post cut and my keel punch and then placed the size 4 trial tibia followed by the size 5 trial femur.  I placed a 12.5-mm polyethylene insert and he felt balanced in flexion and extension.  I then made my patellar cut with 16 mm of patella after measuring 25 mm.  I drilled  lugs for the femoral component as well as for the size 38 patella.  I then removed all trial components and copiously irrigated the knee with normal saline solution. We then cemented the real PFC Sigma DePuy size 4 tibial tray, which was rotating platform.  We cemented the real size 5 femur.  We placed the 12.5-mm polyethylene insert and cemented the size 38 patellar button. Once the cement had dried, the tourniquet was let down and hemostasis was obtained with electrocautery.  We placed a medium Hemovac drain within the arthrotomy.  I closed the arthrotomy with interrupted #1 Vicryl suture followed by running 0 V-Loc suture.  I closed the subcutaneous tissue with interrupted 2-0 Vicryl followed by interrupted staples on the skin.  Xeroform followed by well-padded sterile dressing was applied, and the patient's knee was placed in knee immobilizer.  He was taken to the recovery room in stable condition.  All final counts were correct.  There were no complications noted.     Vanita Panda. Magnus Ivan, M.D.     CYB/MEDQ  D:  08/18/2012  T:  08/18/2012  Job:  [id number]

## 2012-08-18 NOTE — Anesthesia Procedure Notes (Signed)
Spinal  Patient location during procedure: OR Start time: 08/18/2012 7:40 AM Staffing Anesthesiologist: Phillips Grout Performed by: anesthesiologist  Preanesthetic Checklist Completed: patient identified, site marked, surgical consent, pre-op evaluation, timeout performed, IV checked, risks and benefits discussed and monitors and equipment checked Spinal Block Patient position: sitting Prep: Betadine Patient monitoring: heart rate, continuous pulse ox and blood pressure Approach: right paramedian Location: L3-4 Injection technique: single-shot Needle Needle type: Spinocan  Needle gauge: 22 G Needle length: 9 cm Additional Notes Expiration date of kit checked and confirmed. Patient tolerated procedure well, without complications.

## 2012-08-18 NOTE — Care Management Note (Signed)
    Page 1 of 2   08/21/2012     1:33:29 PM   CARE MANAGEMENT NOTE 08/21/2012  Patient:  Franklin Jones, Franklin Jones   Account Number:  [id number]  Date Initiated:  08/18/2012  Documentation initiated by:  Colleen Can  Subjective/Objective Assessment:   dx total knee replacemnt -left     Action/Plan:   CM spoke with patient and spouse. Plans arefor patient to return to his home in [place] where spouse will be caregiver. He already has DME and is requesting [facility] for [facility] services/   Anticipated DC Date:  08/21/2012   Anticipated DC Plan:  HOME W HOME HEALTH SERVICES  In-house referral  NA      DC Planning Services  CM consult      [facility] Choice  HOME HEALTH   Choice offered to / List presented to:  C-1 Patient   DME arranged  NA      DME agency  NA     HH arranged  HH-2 PT      HH agency  [facility]   Status of service:  Completed, signed off Medicare Important Message given?  NO (If response is "NO", the following Medicare IM given date fields will be blank) Date Medicare IM given:   Date Additional Medicare IM given:    Discharge Disposition:  HOME W HOME HEALTH SERVICES  Per UR Regulation:  Reviewed for med. necessity/level of care/duration of stay  If discussed at Long Length of Stay Meetings, dates discussed:    Comments:  11/004/2013 Damaris Schooner RN CCM [phone] PT DISCHARGED TODAY. [facility] WILL START SERVICES FOR [name] SERVICES TOMORROW 08/22/2012  08/18/2012 Damaris Schooner RN CCM [phone] [facility] notitifed and can service patient with hh services. CM will follow for hh orders.

## 2012-08-19 LAB — CBC
HCT: 33.2 % — ABNORMAL LOW (ref 39.0–52.0)
MCHC: 34 g/dL (ref 30.0–36.0)
MCV: 90.5 fL (ref 78.0–100.0)
RDW: 13.1 % (ref 11.5–15.5)

## 2012-08-19 LAB — BASIC METABOLIC PANEL
BUN: 16 mg/dL (ref 6–23)
Calcium: 8.3 mg/dL — ABNORMAL LOW (ref 8.4–10.5)
Creatinine, Ser: 1.21 mg/dL (ref 0.50–1.35)
GFR calc Af Amer: 72 mL/min — ABNORMAL LOW (ref >90)
GFR calc non Af Amer: 62 mL/min — ABNORMAL LOW (ref >90)
Potassium: 4.1 mEq/L (ref 3.5–5.1)

## 2012-08-19 MED ORDER — METHOCARBAMOL 500 MG PO TABS
500.0000 mg | ORAL_TABLET | Freq: Four times a day (QID) | ORAL | Status: DC | PRN
  Administered 2012-08-19 – 2012-08-21 (×6): 500 mg via ORAL
  Filled 2012-08-19 (×6): qty 1

## 2012-08-19 NOTE — Evaluation (Signed)
Occupational Therapy Evaluation Patient Details Name: Franklin Jones MRN: [id number] DOB: 1950/11/19 Today's Date: 08/19/2012 Time: 4259-5638 OT Time Calculation (min): 26 min  OT Assessment / Plan / Recommendation Clinical Impression  This 61 y.o. male admitted for Lt. TKA, and had Rt. TKA performed in 11/2011,  Pt. has all DME at home, and good support from family.  All education completed.  No further OT needs identified    OT Assessment  Patient does not need any further OT services    Follow Up Recommendations  No OT follow up    Barriers to Discharge      Equipment Recommendations  None recommended by PT;None recommended by OT    Recommendations for Other Services    Frequency       Precautions / Restrictions Precautions Precautions: Knee Required Braces or Orthoses:  (DC'd KI 08/19/12) Restrictions Weight Bearing Restrictions: No LLE Weight Bearing: Weight bearing as tolerated   Pertinent Vitals/Pain     ADL  Eating/Feeding: Independent Where Assessed - Eating/Feeding: Chair Grooming: Wash/dry hands;Wash/dry face;Teeth care;Set up Where Assessed - Grooming: Supported sitting Upper Body Bathing: Set up Where Assessed - Upper Body Bathing: Supported sitting Lower Body Bathing: Minimal assistance Where Assessed - Lower Body Bathing: Supported sit to stand Upper Body Dressing: Set up Where Assessed - Upper Body Dressing: Unsupported sitting Lower Body Dressing: Minimal assistance Where Assessed - Lower Body Dressing: Supported sit to Pharmacist, hospital: Minimal assistance Toilet Transfer Method: Sit to Barista: Comfort height toilet;Regular height toilet;Grab bars Toileting - Clothing Manipulation and Hygiene: Minimal assistance Where Assessed - Engineer, mining and Hygiene: Standing Equipment Used: Rolling walker Transfers/Ambulation Related to ADLs: Min guard assist for sit to stand.  Pt mildly impulsive ADL Comments:  Pt. able to don/doff Lt. sock with effort.  Pt has all DME.  Pt. does not like 3-in-1, and therefore, does not use it.  Discussed safety with toilet transfers, pt and wife verbalized understanding, but pt does not feel he needs further practice (Pt has tub transfer bench at home)    OT Diagnosis:    OT Problem List:   OT Treatment Interventions:     OT Goals    Visit Information  Last OT Received On: 08/19/12 Assistance Needed: +1    Subjective Data  Subjective: "I use the regular commode"  "I don't like the other thing" Patient Stated Goal: To go home   Prior Functioning     Home Living Lives With: Spouse Available Help at Discharge: Family Type of Home: House Home Access: Stairs to enter [employer] of Steps: 5 Entrance Stairs-Rails: Right Home Layout: Two level;Able to live on main level with bedroom/bathroom Bathroom Shower/Tub: Tub/shower unit;Curtain Bathroom Toilet: Standard Bathroom Accessibility: Yes How Accessible: Accessible via walker Home Adaptive Equipment: Walker - rolling;Straight cane;Tub transfer bench;Bedside commode/3-in-1 Prior Function Level of Independence: Independent Able to Take Stairs?: Yes Driving: Yes Communication Communication: No difficulties         Vision/Perception     Cognition  Overall Cognitive Status: Appears within functional limits for tasks assessed/performed Arousal/Alertness: Awake/alert Orientation Level: Appears intact for tasks assessed Behavior During Session: [facility] for tasks performed    Extremity/Trunk Assessment Right Upper Extremity Assessment RUE ROM/Strength/Tone: Within functional levels RUE Coordination: WFL - gross/fine motor Left Upper Extremity Assessment LUE ROM/Strength/Tone: Within functional levels LUE Coordination: WFL - gross/fine motor Trunk Assessment Trunk Assessment: Normal     Mobility Bed Mobility Bed Mobility: Sit to Supine Supine to Sit:  5: Supervision Sit to Supine: 5:  Supervision;HOB flat Transfers Transfers: Sit to Stand;Stand to Sit Sit to Stand: 5: Supervision;With upper extremity assist;From chair/3-in-1 Stand to Sit: 5: Supervision;To bed;With upper extremity assist Details for Transfer Assistance: VCs safety, technique, hand placement.      Shoulder Instructions     Exercise Total Joint Exercises Ankle Circles/Pumps: AROM;Both;10 reps;Supine Quad Sets: AROM;Both;10 reps;Supine Short Arc Quad: AROM;Left;10 reps;Supine Heel Slides: AAROM;Left;10 reps;Supine Straight Leg Raises: AROM;Left;10 reps;Supine   Balance     End of Session OT - End of Session Activity Tolerance: Patient tolerated treatment well Patient left: in bed;with call bell/phone within reach;with family/visitor present  GO     Gabriell Casimir, Ursula Alert M 08/19/2012, 2:28 PM

## 2012-08-19 NOTE — Progress Notes (Signed)
Subjective: 1 Day Post-Op Procedure(s) (LRB): TOTAL KNEE ARTHROPLASTY (Left) Patient reports pain as moderate.    Objective: Vital signs in last 24 hours: Temp:  [97.2 F (36.2 C)-99 F (37.2 C)] 98.5 F (36.9 C) (11/02 0618) Pulse Rate:  [68-94] 82  (11/02 0618) Resp:  [16-20] 16  (11/02 0618) BP: (116-157)/(74-93) 120/83 mmHg (11/02 0618) SpO2:  [97 %-100 %] 97 % (11/02 0618)  Intake/Output from previous day: 11/01 0701 - 11/02 0700 In: 4890 [P.O.:240; I.V.:4600; IV Piggyback:50] Out: 2310 [Urine:1325; Drains:985] Intake/Output this shift: Total I/O In: 240 [P.O.:240] Out: 100 [Urine:100]   Basename 08/19/12 0431  HGB 11.3*    Basename 08/19/12 0431  WBC 8.6  RBC 3.67*  HCT 33.2*  PLT 176    Basename 08/19/12 0431  NA 136  K 4.1  CL 104  CO2 26  BUN 16  CREATININE 1.21  GLUCOSE 113*  CALCIUM 8.3*   No results found for this basename: LABPT:2,INR:2 in the last 72 hours  Neurologically intact  Assessment/Plan: 1 Day Post-Op Procedure(s) (LRB): TOTAL KNEE ARTHROPLASTY (Left) Up with therapy   HV  280 cc so far . More than normal.  Hgb  11.3.    Will leave HV til AM  Ashland Wiseman C 08/19/2012, 11:22 AM

## 2012-08-19 NOTE — Progress Notes (Signed)
Physical Therapy Treatment Patient Details Name: REEVE TURNLEY MRN: [id number] DOB: [birth year] Today's Date: 08/19/2012 Time: 7829-5621 PT Time Calculation (min): 23 min  PT Assessment / Plan / Recommendation Comments on Treatment Session  continuing to progress well. POD1. HHPT    Follow Up Recommendations  Home health PT     Does the patient have the potential to tolerate intense rehabilitation     Barriers to Discharge        Equipment Recommendations  None recommended by PT    Recommendations for Other Services    Frequency 7X/week   Plan Discharge plan remains appropriate    Precautions / Restrictions Precautions Precautions: Knee Required Braces or Orthoses:  (DC'd KI 08/19/12) Restrictions Weight Bearing Restrictions: No LLE Weight Bearing: Weight bearing as tolerated   Pertinent Vitals/Pain 5/10 L knee    Mobility  Bed Mobility Bed Mobility: Supine to Sit Supine to Sit: 5: Supervision Transfers Transfers: Sit to Stand;Stand to Sit Sit to Stand: 5: Supervision;From bed;From elevated surface;With armrests Stand to Sit: 5: Supervision;To chair/3-in-1;With armrests Details for Transfer Assistance: VCs safety, technique, hand placement.  Ambulation/Gait Ambulation/Gait Assistance: 4: Min guard Ambulation Distance (Feet): 140 Feet Assistive device: Rolling walker Ambulation/Gait Assistance Details: VCs safety, pacing, sequence Gait Pattern: Step-through pattern;Antalgic    Exercises Total Joint Exercises Ankle Circles/Pumps: AROM;Both;10 reps;Supine Quad Sets: AROM;Both;10 reps;Supine Short Arc Quad: AROM;Left;10 reps;Supine Heel Slides: AAROM;Left;10 reps;Supine Straight Leg Raises: AROM;Left;10 reps;Supine   PT Diagnosis:    PT Problem List:   PT Treatment Interventions:     PT Goals Acute Rehab PT Goals Pt will go Supine/Side to Sit: with modified independence PT Goal: Supine/Side to Sit - Progress: Updated due to goal met Pt will go Sit to  Supine/Side: with modified independence PT Goal: Sit to Supine/Side - Progress: Updated due to goal met Pt will go Sit to Stand: with modified independence PT Goal: Sit to Stand - Progress: Updated due to goal met Pt will Ambulate: 51 - 150 feet;with supervision;with least restrictive assistive device PT Goal: Ambulate - Progress: Progressing toward goal Pt will Perform Home Exercise Program: with supervision, verbal cues required/provided PT Goal: Perform Home Exercise Program - Progress: Progressing toward goal  Visit Information  Last PT Received On: 08/19/12 Assistance Needed: +1    Subjective Data  Subjective: "I'm ready" Patient Stated Goal: Home   Cognition  Overall Cognitive Status: Appears within functional limits for tasks assessed/performed Arousal/Alertness: Awake/alert Orientation Level: Appears intact for tasks assessed Behavior During Session: [facility] for tasks performed    Balance     End of Session PT - End of Session Equipment Utilized During Treatment: Gait belt Activity Tolerance: Patient tolerated treatment well Patient left: in chair;with call bell/phone within reach;with family/visitor present   GP     Rebeca Alert [facility] 08/19/2012, 12:16 PM [id number]

## 2012-08-19 NOTE — Progress Notes (Signed)
Physical Therapy Treatment Patient Details Name: Franklin Jones MRN: [id number] DOB: [birth year] Today's Date: 08/19/2012 Time: 1355-1410 PT Time Calculation (min): 15 min  PT Assessment / Plan / Recommendation Comments on Treatment Session  Progressing well. HHPT    Follow Up Recommendations  Home health PT     Does the patient have the potential to tolerate intense rehabilitation     Barriers to Discharge        Equipment Recommendations  None recommended by PT;None recommended by OT    Recommendations for Other Services    Frequency 7X/week   Plan Discharge plan remains appropriate    Precautions / Restrictions Precautions Precautions: Knee Required Braces or Orthoses:  (DC'd KI 08/19/12) Restrictions Weight Bearing Restrictions: No LLE Weight Bearing: Weight bearing as tolerated   Pertinent Vitals/Pain 4/10 L knee    Mobility  Bed Mobility Bed Mobility: Supine to Sit;Sit to Supine Supine to Sit: 5: Supervision;HOB elevated Sit to Supine: 5: Supervision;HOB flat Transfers Transfers: Sit to Stand;Stand to Sit Sit to Stand: From elevated surface;From bed;5: Supervision;With upper extremity assist Stand to Sit: 5: Supervision;To bed;With upper extremity assist Details for Transfer Assistance: VCs safety, hand placement Ambulation/Gait Ambulation/Gait Assistance: 4: Min guard Ambulation Distance (Feet): 165 Feet Assistive device: Rolling walker Ambulation/Gait Assistance Details: good gait speed.  Gait Pattern: Step-through pattern;Antalgic;Decreased stance time - left    Exercises    PT Diagnosis:    PT Problem List:   PT Treatment Interventions:     PT Goals Acute Rehab PT Goals Pt will go Supine/Side to Sit: with modified independence PT Goal: Supine/Side to Sit - Progress: Progressing toward goal Pt will go Sit to Supine/Side: with modified independence PT Goal: Sit to Supine/Side - Progress: Progressing toward goal Pt will go Sit to Stand: with modified  independence PT Goal: Sit to Stand - Progress: Progressing toward goal Pt will Ambulate: 51 - 150 feet;with least restrictive assistive device;with supervision PT Goal: Ambulate - Progress: Progressing toward goal Pt will Perform Home Exercise Program: with supervision, verbal cues required/provided PT Goal: Perform Home Exercise Program - Progress: Progressing toward goal  Visit Information  Last PT Received On: 08/19/12 Assistance Needed: +1    Subjective Data  Subjective: "I wanna sit up in that chair, but its too short" Patient Stated Goal: Home   Cognition  Overall Cognitive Status: Appears within functional limits for tasks assessed/performed Arousal/Alertness: Awake/Jones Orientation Level: Appears intact for tasks assessed Behavior During Session: Franklin Jones for tasks performed    Balance     End of Session PT - End of Session Equipment Utilized During Treatment: Gait belt Activity Tolerance: Patient tolerated treatment well Patient left: in bed;with call bell/phone within reach;with family/visitor present   GP     Franklin Jones [facility] 08/19/2012, 2:56 PM [phone]

## 2012-08-20 LAB — CBC
Platelets: 172 10*3/uL (ref 150–400)
RDW: 13.1 % (ref 11.5–15.5)
WBC: 11.3 10*3/uL — ABNORMAL HIGH (ref 4.0–10.5)

## 2012-08-20 NOTE — Progress Notes (Signed)
Physical Therapy Treatment Patient Details Name: Franklin Jones MRN: [id number] DOB: [birth year] Today's Date: 08/20/2012 Time: 1435-1450 PT Time Calculation (min): 15 min  PT Assessment / Plan / Recommendation Comments on Treatment Session  pt. eager to ambulate even though he just got into bed.     Follow Up Recommendations  Home health PT     Does the patient have the potential to tolerate intense rehabilitation     Barriers to Discharge        Equipment Recommendations  None recommended by PT    Recommendations for Other Services    Frequency 7X/week   Plan Discharge plan remains appropriate    Precautions / Restrictions Precautions Precautions: Knee   Pertinent Vitals/Pain 3/10 L knee. Ice applied.    Mobility  Bed Mobility Supine to Sit: 5: Supervision;HOB elevated Sit to Supine: 5: Supervision;HOB elevated Details for Bed Mobility Assistance: pt able to manage LLE in/out of bed. Transfers Sit to Stand: 4: Min guard;From elevated surface;With upper extremity assist;From bed Stand to Sit: 4: Min guard;To bed Details for Transfer Assistance: VC for hand position and LLE, vc for safety, pt slightly impulsive. Ambulation/Gait Ambulation/Gait Assistance: 4: Min guard Ambulation Distance (Feet): 165 Feet Assistive device: Rolling walker Ambulation/Gait Assistance Details: good speed, no KI Gait Pattern: Step-through pattern    Exercises Total Joint Exercises Quad Sets: AROM;Left;10 reps;Supine Short Arc Quad: AAROM;Left;10 reps;Supine Heel Slides: AAROM;Left;10 reps;Supine Straight Leg Raises: Left;10 reps;Supine   PT Diagnosis:    PT Problem List:   PT Treatment Interventions:     PT Goals Acute Rehab PT Goals Pt will go Supine/Side to Sit: with modified independence PT Goal: Supine/Side to Sit - Progress: Progressing toward goal Pt will go Sit to Supine/Side: with modified independence PT Goal: Sit to Supine/Side - Progress: Progressing toward goal Pt  will go Sit to Stand: with modified independence PT Goal: Sit to Stand - Progress: Progressing toward goal Pt will Ambulate: 51 - 150 feet;with supervision;with least restrictive assistive device PT Goal: Ambulate - Progress: Progressing toward goal Pt will Perform Home Exercise Program: with supervision, verbal cues required/provided PT Goal: Perform Home Exercise Program - Progress: Progressing toward goal  Visit Information  Last PT Received On: 08/20/12 Assistance Needed: +1    Subjective Data  Subjective: I just got baack. I will walk. Let me do it.   Cognition  Overall Cognitive Status: Appears within functional limits for tasks assessed/performed Arousal/Alertness: Awake/alert Orientation Level: Appears intact for tasks assessed Behavior During Session: [facility] for tasks performed    Balance     End of Session PT - End of Session Activity Tolerance: Patient tolerated treatment well Patient left: in bed;with call bell/phone within reach;with family/visitor present Nurse Communication: Mobility status   GP     Rada Hay 08/20/2012, 4:24 PM

## 2012-08-20 NOTE — Progress Notes (Signed)
Physical Therapy Treatment Patient Details Name: Franklin Jones MRN: [id number] DOB: 12-08-1950 Today's Date: 08/20/2012 Time: 4098-1191 PT Time Calculation (min): 26 min  PT Assessment / Plan / Recommendation Comments on Treatment Session  pt. tolrated ambulation/exercises well. Plans DC tomorrow.    Follow Up Recommendations  Home health PT     Does the patient have the potential to tolerate intense rehabilitation     Barriers to Discharge        Equipment Recommendations  None recommended by PT    Recommendations for Other Services    Frequency 7X/week   Plan Discharge plan remains appropriate    Precautions / Restrictions Precautions Precautions: Knee   Pertinent Vitals/Pain Pain 3/10 L knee after meds., ice    Mobility  Bed Mobility Supine to Sit: 5: Supervision;With rails;HOB elevated Details for Bed Mobility Assistance: pt able to move to edge and manage LLE Transfers Sit to Stand: From elevated surface;4: Min guard;From bed;With upper extremity assist Stand to Sit: To chair/3-in-1;5: Supervision;With upper extremity assist;With armrests Details for Transfer Assistance: VC's for hand placement Ambulation/Gait Ambulation/Gait Assistance: 4: Min guard Ambulation Distance (Feet): 165 Feet Ambulation/Gait Assistance Details: good speed, no KI Gait Pattern: Step-through pattern;Antalgic;Decreased stance time - left    Exercises Total Joint Exercises Quad Sets: AROM;Left;10 reps;Supine Short Arc Quad: AAROM;Left;10 reps;Supine Heel Slides: AAROM;Left;10 reps;Supine Straight Leg Raises: Left;10 reps;Supine   PT Diagnosis:    PT Problem List:   PT Treatment Interventions:     PT Goals Acute Rehab PT Goals Pt will go Supine/Side to Sit: with modified independence PT Goal: Supine/Side to Sit - Progress: Progressing toward goal Pt will go Sit to Stand: with modified independence PT Goal: Sit to Stand - Progress: Progressing toward goal Pt will Ambulate: 51 - 150  feet;with least restrictive assistive device;with supervision PT Goal: Ambulate - Progress: Progressing toward goal Pt will Perform Home Exercise Program: with supervision, verbal cues required/provided PT Goal: Perform Home Exercise Program - Progress: Progressing toward goal  Visit Information  Last PT Received On: 08/20/12 Assistance Needed: +1    Subjective Data  Subjective: I have just been dozing.   Cognition  Overall Cognitive Status: Appears within functional limits for tasks assessed/performed Arousal/Alertness: Awake/alert Orientation Level: Appears intact for tasks assessed Behavior During Session: [facility] for tasks performed    Balance     End of Session PT - End of Session Activity Tolerance: Patient tolerated treatment well Patient left: in chair;with call bell/phone within reach;with family/visitor present Nurse Communication: Mobility status   GP     Rada Hay 08/20/2012, 12:52 PM

## 2012-08-20 NOTE — Progress Notes (Signed)
Subjective: 2 Days Post-Op Procedure(s) (LRB): TOTAL KNEE ARTHROPLASTY (Left) Patient reports pain as mild.    Objective: Vital signs in last 24 hours: Temp:  [98.6 F (37 C)-100 F (37.8 C)] 98.6 F (37 C) (11/03 0626) Pulse Rate:  [82-91] 91  (11/03 0626) Resp:  [16] 16  (11/03 0800) BP: (147-180)/(78-89) 156/81 mmHg (11/03 0626) SpO2:  [97 %] 97 % (11/03 0800)  Intake/Output from previous day: 11/02 0701 - 11/03 0700 In: 1170 [P.O.:720; I.V.:450] Out: 3150 [Urine:3150] Intake/Output this shift: Total I/O In: 240 [P.O.:240] Out: 600 [Urine:600]   Basename 08/20/12 0459 08/19/12 0431  HGB 11.3* 11.3*    Basename 08/20/12 0459 08/19/12 0431  WBC 11.3* 8.6  RBC 3.64* 3.67*  HCT 33.0* 33.2*  PLT 172 176    Basename 08/19/12 0431  NA 136  K 4.1  CL 104  CO2 26  BUN 16  CREATININE 1.21  GLUCOSE 113*  CALCIUM 8.3*   No results found for this basename: LABPT:2,INR:2 in the last 72 hours  Compartment soft  Assessment/Plan: 2 Days Post-Op Procedure(s) (LRB): TOTAL KNEE ARTHROPLASTY (Left) Up with therapy possible home in AM  Sheikh Leverich C 08/20/2012, 12:24 PM

## 2012-08-21 ENCOUNTER — Encounter (HOSPITAL_COMMUNITY): Payer: Self-pay | Admitting: Orthopaedic Surgery

## 2012-08-21 LAB — CBC
Hemoglobin: 11.2 g/dL — ABNORMAL LOW (ref 13.0–17.0)
Platelets: 183 10*3/uL (ref 150–400)
RBC: 3.6 MIL/uL — ABNORMAL LOW (ref 4.22–5.81)

## 2012-08-21 MED ORDER — ASPIRIN 325 MG PO TABS: 325.0000 mg | ORAL_TABLET | Freq: Two times a day (BID) | ORAL | Status: DC

## 2012-08-21 MED ORDER — METHOCARBAMOL 500 MG PO TABS: 500.0000 mg | ORAL_TABLET | Freq: Four times a day (QID) | ORAL | Status: DC | PRN

## 2012-08-21 MED ORDER — OXYCODONE-ACETAMINOPHEN 5-325 MG PO TABS: 1.0000 | ORAL_TABLET | ORAL | Status: AC | PRN

## 2012-08-21 NOTE — Progress Notes (Signed)
Patient ID: Franklin Jones, male   DOB: 1951/04/03, 61 y.o.   MRN: [id number] Looks good.  Can d/c to home today.

## 2012-08-21 NOTE — Progress Notes (Signed)
Pt to d/c home. Pt has all needed equipment. AVS reviewed and "My Chart" discussed with pt. Pt capable of verbalizing medications and follow-up appointments with Dr. Magnus Ivan. Remains hemodynamically stable. No signs and symptoms of distress. Educated pt to return to ER in the case of SOB, dizziness, or chest pain.

## 2012-08-21 NOTE — Discharge Summary (Signed)
Patient ID: Franklin Jones MRN: [id number] DOB/AGE: [birth year] 62 y.o.  Admit date: 08/18/2012 Discharge date: 08/21/2012  Admission Diagnoses:  Principal Problem:  *Arthritis of knee, left   Discharge Diagnoses:  Same  Past Medical History  Diagnosis Date  . Allergic rhinitis   . History of asbestos exposure     followed by Dr Ortencia Kick- states stable years- last CT chest 7/12 in Epic  . Arthritis   . Degenerative arthritis of knee 11/19/2011  . Shortness of breath     SOME SOB BUT CAN CLIMB FLIGHT OF STAIRS    Surgeries: Procedure(s): TOTAL KNEE ARTHROPLASTY on 08/18/2012   Consultants:    Discharged Condition: Improved  Hospital Course: Franklin Jones is an 61 y.o. male who was admitted 08/18/2012 for operative treatment ofArthritis of knee, left. Patient has severe unremitting pain that affects sleep, daily activities, and work/hobbies. After pre-op clearance the patient was taken to the operating room on 08/18/2012 and underwent  Procedure(s): TOTAL KNEE ARTHROPLASTY.    Patient was given perioperative antibiotics: Anti-infectives     Start     Dose/Rate Route Frequency Ordered Stop   08/18/12 1400   clindamycin (CLEOCIN) IVPB 600 mg        600 mg 100 mL/hr over 30 Minutes Intravenous Every 6 hours 08/18/12 1126 08/18/12 2014   08/18/12 0528   clindamycin (CLEOCIN) IVPB 900 mg        900 mg 100 mL/hr over 30 Minutes Intravenous 60 min pre-op 08/18/12 0528 08/18/12 0738           Patient was given sequential compression devices, early ambulation, and chemoprophylaxis to prevent DVT.  Patient benefited maximally from hospital stay and there were no complications.    Recent vital signs: Patient Vitals for the past 24 hrs:  BP Temp Temp src Pulse Resp SpO2  09-06-12 2114 137/81 mmHg 100.5 F (38.1 C) Oral 100  16  93 %  September 06, 2012 1400 147/89 mmHg 98.6 F (37 C) Oral 91  18  97 %  09/06/12 1200 - - - - 18  97 %  2012-09-06 0800 - - - - 16  97 %     Recent laboratory  studies:  Basename 08/21/12 0425 September 06, 2012 0459 08/19/12 0431  WBC 11.5* 11.3* --  HGB 11.2* 11.3* --  HCT 32.6* 33.0* --  PLT 183 172 --  NA -- -- 136  K -- -- 4.1  CL -- -- 104  CO2 -- -- 26  BUN -- -- 16  CREATININE -- -- 1.21  GLUCOSE -- -- 113*  INR -- -- --  CALCIUM -- -- 8.3*     Discharge Medications:     Medication List     As of 08/21/2012  6:28 AM    STOP taking these medications         naproxen 500 MG tablet   Commonly known as: NAPROSYN      TAKE these medications         acetaminophen 500 MG tablet   Commonly known as: TYLENOL   Take 1,000 mg by mouth every 6 (six) hours as needed.      aspirin 325 MG tablet   Take 1 tablet (325 mg total) by mouth 2 (two) times daily.      Fish Oil 600 MG Caps   Take 1 capsule by mouth daily.      Glucosamine Chondr 1500 Complx Caps   Take 2 capsules by mouth daily.  methocarbamol 500 MG tablet   Commonly known as: ROBAXIN   Take 1 tablet (500 mg total) by mouth every 6 (six) hours as needed (spasms).      multivitamin tablet   Take 1 tablet by mouth daily.      oxyCODONE-acetaminophen 5-325 MG per tablet   Commonly known as: PERCOCET/ROXICET   Take 1-2 tablets by mouth every 4 (four) hours as needed for pain.        Diagnostic Studies: Dg Knee Left Port  08/18/2012  *RADIOLOGY REPORT*  Clinical Data: 61 year old male status post left knee surgery.  PORTABLE LEFT KNEE - 1-2 VIEW  Comparison: None.  Findings: Portable AP and cross-table lateral views of the left knee.  Hardware components appear intact normally aligned.  Midline skin staples.  Postoperative drain in place.  Postoperative changes to the surrounding soft tissues.  No unexpected osseous changes.  IMPRESSION: Left total knee arthroplasty, no adverse features identified.   Original Report Authenticated By: Erskine Speed, M.D.     Disposition: 06-Home-Health Care Svc      Discharge Orders    Future Orders Please Complete By Expires   Diet -  low sodium heart healthy      Call MD / Call 911      Comments:   If you experience chest pain or shortness of breath, CALL 911 and be transported to the hospital emergency room.  If you develope a fever above 101 F, pus (white drainage) or increased drainage or redness at the wound, or calf pain, call your surgeon's office.   Constipation Prevention      Comments:   Drink plenty of fluids.  Prune juice may be helpful.  You may use a stool softener, such as Colace (over the counter) 100 mg twice a day.  Use MiraLax (over the counter) for constipation as needed.   Increase activity slowly as tolerated      Discharge instructions      Comments:   Increase your activity as comfort allows. Ice and elevation for swelling. You can get your current knee dressing wet in the shower. You can get your actual incision wet starting this Wed. 11/6   Discharge patient         Follow-up Information    Follow up with Kathryne Hitch, MD. In 2 weeks.   Contact information:   [address] [place] [address] [phone]           Signed: Kathryne Hitch 08/21/2012, 6:28 AM

## 2012-08-21 NOTE — Progress Notes (Signed)
Physical Therapy Treatment Patient Details Name: Franklin Jones MRN: [id number] DOB: 03/30/1951 Today's Date: 08/21/2012 Time: 0917-0950 PT Time Calculation (min): 33 min  PT Assessment / Plan / Recommendation Comments on Treatment Session  Pt given handout on TKR TE's HEP and performed all with instructions on proper tech and freq.    Follow Up Recommendations  Home health PT     Does the patient have the potential to tolerate intense rehabilitation     Barriers to Discharge        Equipment Recommendations  None recommended by PT    Recommendations for Other Services    Frequency 7X/week   Plan Discharge plan remains appropriate    Precautions / Restrictions     Pertinent Vitals/Pain C/o 4/10 ICE applied    Mobility    Tx session focused on TE's HEP   Exercises Total Joint Exercises Ankle Circles/Pumps: AROM;Both;10 reps;Supine Quad Sets: AROM;Both;10 reps;Supine Gluteal Sets: AROM;Both;10 reps;Supine Towel Squeeze: AROM;Both;10 reps;Supine Short Arc Quad: AROM;Left;10 reps;Supine Heel Slides: AAROM;Left;10 reps;Supine (using bed sheet) Hip ABduction/ADduction: AROM;Left;10 reps;Supine Straight Leg Raises: AROM;Left;10 reps;Supine Long Arc Quad: AROM;Left;10 reps;Seated Knee Flexion: AROM;Left;10 reps;Seated      Visit Information  Last PT Received On: 08/21/12 Assistance Needed: +1                   End of Session PT - End of Session Activity Tolerance: Patient tolerated treatment well Patient left: in bed;with call bell/phone within reach (ICE to L knee)   Felecia Shelling  PTA [facility]  Acute  Rehab Pager     [phone]

## 2012-08-21 NOTE — Progress Notes (Signed)
Physical Therapy Treatment Patient Details Name: Franklin Jones MRN: [id number] DOB: November 18, 1950 Today's Date: 08/21/2012 Time: 5621-3086 PT Time Calculation (min): 26 min  PT Assessment / Plan / Recommendation Comments on Treatment Session  Pt ready for D/C to home with spouse.    Follow Up Recommendations  Home health PT     Does the patient have the potential to tolerate intense rehabilitation     Barriers to Discharge        Equipment Recommendations  None recommended by PT    Recommendations for Other Services    Frequency 7X/week   Plan Discharge plan remains appropriate    Precautions / Restrictions  knee/D/C KI able to perform 10 active SLR   Pertinent Vitals/Pain C/o 3/10 with amb    Mobility  Bed Mobility Bed Mobility: Supine to Sit Supine to Sit: 5: Supervision Details for Bed Mobility Assistance: Pt able to assist own L LE off bed  Transfers Transfers: Sit to Stand;Stand to Sit Sit to Stand: 5: Supervision;From bed Stand to Sit: 5: Supervision;To chair/3-in-1 Details for Transfer Assistance: increased time, good use of hands  Ambulation/Gait Ambulation/Gait Assistance: 5: Supervision Ambulation Distance (Feet): 225 Feet Assistive device: Rolling walker Ambulation/Gait Assistance Details: No KI and < 25% VC's on safety with truns and backward gait. Gait Pattern: Step-through pattern  Stairs: Yes Stairs Assistance: 5: Supervision Stairs Assistance Details (indicate cue type and reason): with souse and 25% VC's on proper tech and safety  Stair Management Technique: One rail Left;With crutches Number of Stairs: 4          PT Goals                                                        progressing   Visit Information  Last PT Received On: 08/21/12 Assistance Needed: +1                   End of Session PT - End of Session Equipment Utilized During Treatment: Gait belt Activity Tolerance: Patient tolerated treatment well Patient left: in  chair;with call bell/phone within reach   Felecia Shelling  PTA [facility]  Acute  Rehab Pager     [phone]

## 2013-03-26 ENCOUNTER — Telehealth: Payer: Self-pay | Admitting: Emergency Medicine

## 2013-03-26 DIAGNOSIS — Z77090 Contact with and (suspected) exposure to asbestos: Secondary | ICD-10-CM

## 2013-03-26 NOTE — Telephone Encounter (Signed)
Pt was seen by RB on 05/04/12 with the following pt instructions:  Patient Instructions    Your PFT's and your CXR are stable  We will plan to repeat your CT scan in June or July 2014.  We will repeat your PFT in July 2014  If you develop any new breathing problems, we may decide to do your testing earlier  Follow with Dr Delton Coombes in July 2014 after the testing   --------  Called, spoke with pt.  He has PFT and OV with RB scheduled on 06/06/13.  He would like to have CT Chest scheduled on this day as well, if possible.  If not, he would like to have it on early in the am or in the evening; prefers no Tuesday.  Advised would clarify CT orders with RB, then would send order to [facility] who would contact him with an appt date and time.  He verbalized understanding and voiced no further questions or concerns at this time.  Dr. Delton Coombes, pls advise if you would like CT Chest to be with or without contrast.  Thank you.

## 2013-03-27 NOTE — Telephone Encounter (Signed)
No contrast, high resolution cuts. Thank you

## 2013-03-27 NOTE — Telephone Encounter (Signed)
Order placed. Acen Craun, CMA  

## 2013-06-06 ENCOUNTER — Encounter: Payer: Self-pay | Admitting: Emergency Medicine

## 2013-06-06 ENCOUNTER — Ambulatory Visit (INDEPENDENT_AMBULATORY_CARE_PROVIDER_SITE_OTHER): Payer: Worker's Compensation | Admitting: Emergency Medicine

## 2013-06-06 ENCOUNTER — Ambulatory Visit (INDEPENDENT_AMBULATORY_CARE_PROVIDER_SITE_OTHER)
Admission: RE | Admit: 2013-06-06 | Discharge: 2013-06-06 | Disposition: A | Discharge status: Home / Self Care | Payer: Worker's Compensation | Source: Ambulatory Visit | Attending: Emergency Medicine | Admitting: Emergency Medicine

## 2013-06-06 VITALS — BP 120/74 | HR 82 | Temp 98.8°F | Ht 73.5 in | Wt 261.0 lb

## 2013-06-06 DIAGNOSIS — Z77090 Contact with and (suspected) exposure to asbestos: Secondary | ICD-10-CM

## 2013-06-06 DIAGNOSIS — K219 Gastro-esophageal reflux disease without esophagitis: Secondary | ICD-10-CM | POA: Insufficient documentation

## 2013-06-06 DIAGNOSIS — Z87891 Personal history of nicotine dependence: Secondary | ICD-10-CM

## 2013-06-06 LAB — PULMONARY FUNCTION TEST

## 2013-06-06 NOTE — Progress Notes (Signed)
62 yo man, former tobacco, has worked for [employer] with documented asbestos exposure. Was discovered to have an abnormal CXR, around 2004. No other symptoms at that time. Over time he has developed some exertional dyspnea. Has never had a CT scan of the chest. He has done spirometry and CXR's   ROV 04/09/10 -- follows up to today to review Ct scan and PFT in setting of asbestos exposure. Other ROS - mentions sinus pain/pressure, nasal congestion, clear drainage.   ROV 04/28/11 -- follow up visit, hx asbestos exposure. Had PFT and CT scan today. 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 PFT as below. He has noticed a slight decrease in functional capacity - related this to knee pain and decreased activity level, decreased conditioning.   ROV 05/04/12 -- follow up visit, hx asbestos exposure. Had PFT and CT scan today. 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 Returns feeling well. PFT today are stable - FEV1 3.24L. No new symptoms.   ROV 06/06/13 -- 62 yo man, hx asbestos exposure. Underwent CT scan chest and PFT today. Both are stable. He does have some mild to mod AFL with probable BD response. He c/o GERD sx today. He used to take aleve frequently, stopped 3 weeks ago.   Filed Vitals:   06/06/13 1553  BP: 120/74  Pulse: 82  Temp: 98.8 F (37.1 C)   Gen: Pleasant, well-nourished, in no distress,  normal affect  ENT: No lesions,  mouth clear,  oropharynx clear, no postnasal drip  Neck: No JVD, no TMG, no carotid bruits  Lungs: No use of accessory muscles, no dullness to percussion, clear without rales or rhonchi  Cardiovascular: RRR, heart sounds normal, no murmur or gallops, no peripheral edema  Musculoskeletal: No deformities, no cyanosis or clubbing  Neuro: alert, non focal  Skin: Warm, no lesions or rashes  PULMONARY FUNCTON TEST 04/09/2010 04/28/2011 05/04/2012 06/06/2013  FVC 4.98 4.51 4.42 4.27  FEV1 3.68 3.2 3.24 3.1  FEV1/FVC 73.9 71 73.3 72.6  FVC  %  Predicted 92 87 85 80  FEV % Predicted 90 88 90 78  FeF 25-75 2.66 2.01 2.32 4.09  FeF 25-75 % Predicted 3.32 3.29 3.25 5.04     06/06/13 --  Comparison: 04/28/2011  Findings:  No pleural effusions identified. There are no pleural plaques  noted. No airspace consolidation identified. There are no pleural  plaques identified.  Subpleural nodule along the minor fissure of the right lung  measures 8 mm, image 27/series 5. This is unchanged from previous  exam. Right middle lobe parenchymal nodule measures 7 mm and is  unchanged from previous exam, image 34/series 5. Within the left  upper lobe there is a pulmonary nodule which measures 4 mm, image  14/series 4. Within the left lower lobe there is a 3 mm nodule,  image 47/series 5. This is unchanged from previous exam. In the  right lower lobe there is a 4.3 mm nodule which is stable from  prior exam, image 40/series 5. No new or enlarging pulmonary  nodules or masses identified.  On the high resolution images there is evidence of traction  bronchiectasis. Mild bilateral subpleural reticulation is  identified. No honeycombing. No abnormal areas of ground-glass  attenuation identified. On the expiration images there is no  significant air trapping identified.  The heart size appears normal. Calcifications are noted within the  LAD and left circumflex coronary artery. No enlarged mediastinal  or hilar lymph nodes identified. No pericardial effusion.  No  enlarged axillary or supraclavicular lymph nodes.  Limited imaging through the upper abdomen is negative for acute  findings.  Review of the visualized osseous structures is significant for mild  thoracic spondylosis.  IMPRESSION:  1. Multiple, bilateral pulmonary nodules are unchanged when  compared with 04/28/2011. These are most likely benign.  2. Evidence of subpleural reticulation and traction  bronchiectasis. 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  - start SABA prn.

## 2013-06-06 NOTE — Progress Notes (Signed)
PFT done today. 

## 2013-06-06 NOTE — Assessment & Plan Note (Signed)
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  - start SABA prn.

## 2013-06-06 NOTE — Assessment & Plan Note (Signed)
-   CT scan and FEV1 stable today

## 2013-06-06 NOTE — Patient Instructions (Addendum)
Please have albuterol available to use 2 puffs if needed for shortness of breath.  Start omeprazole 20mg  daily. Review your heartburn symptoms with Dr Sherril Croon after starting this medication.  Your CT scan of the chest is stable compared with 2012.  Follow with Dr Delton Coombes in 12 months or sooner if you have any problems

## 2013-06-06 NOTE — Assessment & Plan Note (Signed)
-   start omeprazole 20 - may need a GI workup if sx do not improve, ? PUD

## 2013-06-08 ENCOUNTER — Telehealth: Payer: Self-pay | Admitting: Emergency Medicine

## 2013-06-08 MED ORDER — OMEPRAZOLE 20 MG PO CPDR: 20.0000 mg | DELAYED_RELEASE_CAPSULE | Freq: Every day | ORAL | Status: DC

## 2013-06-08 NOTE — Telephone Encounter (Signed)
Pt aware that I will send in Rx for month of omeprazole 20 mg but if insurance kicks back then he can get this as OTC. Pt aware and nothing more needed at this time.

## 2013-06-19 ENCOUNTER — Encounter: Payer: Self-pay | Admitting: Emergency Medicine

## 2014-02-28 ENCOUNTER — Encounter (INDEPENDENT_AMBULATORY_CARE_PROVIDER_SITE_OTHER): Payer: Self-pay | Admitting: General Surgery

## 2014-03-07 ENCOUNTER — Encounter (INDEPENDENT_AMBULATORY_CARE_PROVIDER_SITE_OTHER): Payer: Self-pay | Admitting: General Surgery

## 2014-03-07 ENCOUNTER — Ambulatory Visit (INDEPENDENT_AMBULATORY_CARE_PROVIDER_SITE_OTHER): Payer: 59 | Admitting: General Surgery

## 2014-03-07 VITALS — BP 142/100 | HR 82 | Temp 97.8°F | Resp 16 | Ht 74.0 in | Wt 258.0 lb

## 2014-03-07 DIAGNOSIS — R229 Localized swelling, mass and lump, unspecified: Secondary | ICD-10-CM

## 2014-03-07 NOTE — Progress Notes (Signed)
Patient ID: Franklin Jones, male   DOB: May 28, 1951, 63 y.o.   MRN: [id number]  Chief Complaint  Patient presents with  . New Evaluation    eval seb cyst on neck    HPI Franklin Jones is a 63 y.o. male.   HPI 63 yo WM referred by Dr Woody Seller for a left neck skin mass. The patient states the area abruptly developed about 5-6 weeks ago. He reports that several months ago there was nothing in the area. His wife doesn't recall any particular mole or growth in that area. He states it has slowly gotten larger over time. It hasn't really drained any fluid per se. It does cause some minor discomfort if you press on the area. He denies any trauma to the area. He denies any weight loss. He denies any fevers or chills. He denies any personal history of skin cancers. He did have 3-4 ticks on his lower legs. He has been on antibiotics for a few days without any change in the lesion. Past Medical History  Diagnosis Date  . Allergic rhinitis   . History of asbestos exposure     followed by Dr West Carbo- states stable years- last CT chest 7/12 in Epic  . Arthritis   . Degenerative arthritis of knee 11/19/2011  . Shortness of breath     SOME SOB BUT CAN CLIMB FLIGHT OF STAIRS    Past Surgical History  Procedure Laterality Date  . Left finger amputation    . Fracture surgery      traumatic injury left small finger with amputation  . Knee arthroplasty  11/19/2011    Procedure: COMPUTER ASSISTED TOTAL KNEE ARTHROPLASTY;  Surgeon: Mcarthur Rossetti, MD;  Location: [facility];  Service: Orthopedics;  Laterality: Right;  Right Total Knee Arthroplasty  . Total knee arthroplasty  08/18/2012    Procedure: TOTAL KNEE ARTHROPLASTY;  Surgeon: Mcarthur Rossetti, MD;  Location: [facility];  Service: Orthopedics;  Laterality: Left;  Left Total Knee Arthroplasty    Family History  Problem Relation Age of Onset  . Allergies Mother   . Heart disease Father   . Lung cancer Brother     Social History History  Substance Use  Topics  . Smoking status: Former Smoker -- 1.00 packs/day for 20 years    Types: Cigarettes    Quit date: 10/18/1985  . Smokeless tobacco: Never Used  . Alcohol Use: 3.0 oz/week    6 drink(s) per week    Allergies  Allergen Reactions  . Penicillins     REACTION: rash    Current Outpatient Prescriptions  Medication Sig Dispense Refill  . omeprazole (PRILOSEC) 20 MG capsule Take 1 capsule (20 mg total) by mouth daily.  30 capsule  0  . acetaminophen (TYLENOL) 500 MG tablet Take 1,000 mg by mouth every 6 (six) hours as needed.      [name] Kitchen aspirin 325 MG tablet Take 1 tablet (325 mg total) by mouth 2 (two) times daily.  60 tablet  0  . fexofenadine (ALLEGRA) 180 MG tablet Take 180 mg by mouth daily.      . Glucosamine-Chondroit-Vit C-Mn (GLUCOSAMINE CHONDR 1500 COMPLX) CAPS Take 2 capsules by mouth daily.       . sildenafil (VIAGRA) 100 MG tablet Take 100 mg by mouth daily as needed for erectile dysfunction.       No current facility-administered medications for this visit.    Review of Systems Review of Systems  Constitutional: Negative for fever, chills,  appetite change and unexpected weight change.  HENT: Positive for hearing loss. Negative for congestion and trouble swallowing.   Eyes: Negative for visual disturbance.  Respiratory: Negative for chest tightness and shortness of breath.   Cardiovascular: Negative for chest pain and leg swelling.       No PND, no orthopnea, no DOE  Gastrointestinal:       See HPI  Genitourinary: Negative for dysuria and hematuria.  Musculoskeletal: Negative.        OA pains  Skin: Negative for rash.  Neurological: Negative for seizures and speech difficulty.  Hematological: Does not bruise/bleed easily.  Psychiatric/Behavioral: Negative for behavioral problems and confusion.    Blood pressure 142/100, pulse 82, temperature 97.8 F (36.6 C), temperature source Temporal, resp. rate 16, height 6\' 2"  (1.88 m), weight 258 lb (117.028  kg).  Physical Exam Physical Exam  Vitals reviewed. Constitutional: He is oriented to person, place, and time. He appears well-developed and well-nourished. No distress.  HENT:  Head: Normocephalic and atraumatic.  Right Ear: External ear normal.  Left Ear: External ear normal.  Eyes: Conjunctivae are normal. No scleral icterus.  Neck: Normal range of motion. Neck supple. No tracheal deviation present. No thyromegaly present.    Left posterior lateral neck skin mass, raised borders with central ulceration, well circumscribed. 2 x 2cm. Nonmobile. No cellulitis, fluctuance, induration. Not terribly tender  Cardiovascular: Normal rate, normal heart sounds and intact distal pulses.   Pulmonary/Chest: Effort normal and breath sounds normal. No respiratory distress. He has no wheezes.  Abdominal: He exhibits no distension.  Musculoskeletal: Normal range of motion. He exhibits no edema and no tenderness.  Lymphadenopathy:    He has no cervical adenopathy.  Neurological: He is alert and oriented to person, place, and time. He exhibits normal muscle tone.  Skin: Skin is warm and dry. No rash noted. He is not diaphoretic. No erythema. No pallor.  Fair skin, multiple sun spots  Psychiatric: He has a normal mood and affect. His behavior is normal. Judgment and thought content normal.      Data Reviewed Dr Woody Seller' note   Assessment    Left posterior neck skin mass     Plan    The etiology of the skin mass is unclear. It is not really consistent with sebaceous cyst. The fact of its rapid growth and abrupt onset suggests a more benign etiology or an inflammatory process. However a skin cancer or neoplasm needs to be ruled out. Therefore I've recommended excisional biopsy of his left posterior neck skin mass. We discussed the risks and benefits of surgery including but not limited to bleeding, infection, injury to surrounding structures, scarring, need for additional procedures, wound healing  problems, hematoma formation, anesthesia complications, and numbness. We discussed the length of the incision would be longer than the actual size of the mass. He will meet with our scheduler today to get scheduled for surgery in the near future  Franklin Hickam M. Redmond Pulling, MD, FACS General, Bariatric, & Minimally Invasive Surgery [facility], [place]         Gayland Curry 03/07/2014, 3:36 PM

## 2014-03-08 ENCOUNTER — Encounter (HOSPITAL_COMMUNITY)
Admission: RE | Admit: 2014-03-08 | Discharge: 2014-03-08 | Disposition: A | Discharge status: Home / Self Care | Payer: 59 | Source: Ambulatory Visit | Attending: General Surgery | Admitting: General Surgery

## 2014-03-08 ENCOUNTER — Encounter (HOSPITAL_BASED_OUTPATIENT_CLINIC_OR_DEPARTMENT_OTHER): Payer: Self-pay | Admitting: *Deleted

## 2014-03-08 DIAGNOSIS — Z01812 Encounter for preprocedural laboratory examination: Secondary | ICD-10-CM | POA: Insufficient documentation

## 2014-03-08 LAB — COMPREHENSIVE METABOLIC PANEL
ALT: 21 U/L (ref 0–53)
AST: 20 U/L (ref 0–37)
Albumin: 3.5 g/dL (ref 3.5–5.2)
Alkaline Phosphatase: 122 U/L — ABNORMAL HIGH (ref 39–117)
BILIRUBIN TOTAL: 0.3 mg/dL (ref 0.3–1.2)
BUN: 20 mg/dL (ref 6–23)
CALCIUM: 9.2 mg/dL (ref 8.4–10.5)
CHLORIDE: 103 meq/L (ref 96–112)
CO2: 23 mEq/L (ref 19–32)
CREATININE: 1.37 mg/dL — AB (ref 0.50–1.35)
GFR, EST AFRICAN AMERICAN: 62 mL/min — AB (ref >90)
GFR, EST NON AFRICAN AMERICAN: 53 mL/min — AB (ref >90)
GLUCOSE: 169 mg/dL — AB (ref 70–99)
Potassium: 4.4 mEq/L (ref 3.7–5.3)
Sodium: 138 mEq/L (ref 137–147)
Total Protein: 7.1 g/dL (ref 6.0–8.3)

## 2014-03-08 LAB — CBC WITH DIFFERENTIAL/PLATELET
BASOS ABS: 0.1 10*3/uL (ref 0.0–0.1)
Basophils Relative: 1 % (ref 0–1)
EOS PCT: 5 % (ref 0–5)
Eosinophils Absolute: 0.4 10*3/uL (ref 0.0–0.7)
HCT: 41.3 % (ref 39.0–52.0)
HEMOGLOBIN: 13.8 g/dL (ref 13.0–17.0)
Lymphocytes Relative: 20 % (ref 12–46)
Lymphs Abs: 1.4 10*3/uL (ref 0.7–4.0)
MCH: 29.2 pg (ref 26.0–34.0)
MCHC: 33.4 g/dL (ref 30.0–36.0)
MCV: 87.5 fL (ref 78.0–100.0)
MONO ABS: 0.9 10*3/uL (ref 0.1–1.0)
Monocytes Relative: 13 % — ABNORMAL HIGH (ref 3–12)
Neutro Abs: 4.4 10*3/uL (ref 1.7–7.7)
Neutrophils Relative %: 61 % (ref 43–77)
Platelets: 206 10*3/uL (ref 150–400)
RBC: 4.72 MIL/uL (ref 4.22–5.81)
RDW: 13.1 % (ref 11.5–15.5)
WBC: 7.1 10*3/uL (ref 4.0–10.5)

## 2014-03-08 NOTE — Progress Notes (Signed)
03/08/14 1018  OBSTRUCTIVE SLEEP APNEA  Have you ever been diagnosed with sleep apnea through a sleep study? No  Do you snore loudly (loud enough to be heard through closed doors)?  1  Do you often feel tired, fatigued, or sleepy during the daytime? 0  Has anyone observed you stop breathing during your sleep? 0  Do you have, or are you being treated for high blood pressure? 0  BMI more than 35 kg/m2? 0  Age over 64 years old? 1  Neck circumference greater than 40 cm/16 inches? 1  Gender: 1  Obstructive Sleep Apnea Score 4  Score 4 or greater  Results sent to PCP

## 2014-03-08 NOTE — Progress Notes (Signed)
To go to [facility] for labs 

## 2014-03-12 ENCOUNTER — Ambulatory Visit (HOSPITAL_BASED_OUTPATIENT_CLINIC_OR_DEPARTMENT_OTHER): Payer: 59 | Admitting: Anesthesiology

## 2014-03-12 ENCOUNTER — Encounter (HOSPITAL_BASED_OUTPATIENT_CLINIC_OR_DEPARTMENT_OTHER)
Admission: RE | Disposition: A | Discharge status: Home / Self Care | Payer: Self-pay | Source: Ambulatory Visit | Attending: General Surgery

## 2014-03-12 ENCOUNTER — Ambulatory Visit (HOSPITAL_BASED_OUTPATIENT_CLINIC_OR_DEPARTMENT_OTHER)
Admission: RE | Admit: 2014-03-12 | Discharge: 2014-03-12 | Disposition: A | Discharge status: Home / Self Care | Payer: 59 | Source: Ambulatory Visit | Attending: General Surgery | Admitting: General Surgery

## 2014-03-12 ENCOUNTER — Ambulatory Visit (INDEPENDENT_AMBULATORY_CARE_PROVIDER_SITE_OTHER): Payer: 59 | Admitting: Surgery

## 2014-03-12 ENCOUNTER — Encounter (HOSPITAL_BASED_OUTPATIENT_CLINIC_OR_DEPARTMENT_OTHER): Payer: Self-pay | Admitting: Anesthesiology

## 2014-03-12 ENCOUNTER — Encounter (HOSPITAL_BASED_OUTPATIENT_CLINIC_OR_DEPARTMENT_OTHER): Payer: 59 | Admitting: Anesthesiology

## 2014-03-12 DIAGNOSIS — C4442 Squamous cell carcinoma of skin of scalp and neck: Secondary | ICD-10-CM | POA: Insufficient documentation

## 2014-03-12 DIAGNOSIS — Z79899 Other long term (current) drug therapy: Secondary | ICD-10-CM | POA: Insufficient documentation

## 2014-03-12 DIAGNOSIS — K219 Gastro-esophageal reflux disease without esophagitis: Secondary | ICD-10-CM | POA: Insufficient documentation

## 2014-03-12 DIAGNOSIS — Z87891 Personal history of nicotine dependence: Secondary | ICD-10-CM | POA: Insufficient documentation

## 2014-03-12 DIAGNOSIS — Z880 Allergy status to penicillin: Secondary | ICD-10-CM | POA: Insufficient documentation

## 2014-03-12 DIAGNOSIS — Z7982 Long term (current) use of aspirin: Secondary | ICD-10-CM | POA: Insufficient documentation

## 2014-03-12 DIAGNOSIS — M1710 Unilateral primary osteoarthritis, unspecified knee: Secondary | ICD-10-CM | POA: Insufficient documentation

## 2014-03-12 DIAGNOSIS — IMO0002 Reserved for concepts with insufficient information to code with codable children: Secondary | ICD-10-CM | POA: Insufficient documentation

## 2014-03-12 HISTORY — PX: MASS EXCISION: SHX2000

## 2014-03-12 SURGERY — EXCISION MASS
Anesthesia: Monitor Anesthesia Care | Site: Neck | Laterality: Left

## 2014-03-12 MED ORDER — OXYCODONE HCL 5 MG PO TABS: 5.0000 mg | ORAL_TABLET | Freq: Once | ORAL | Status: DC | PRN

## 2014-03-12 MED ORDER — SODIUM CHLORIDE 0.9 % IV SOLN
1000.0000 mg | INTRAVENOUS | Status: DC | PRN
  Administered 2014-03-12 (×2): 1000 mg via INTRAVENOUS

## 2014-03-12 MED ORDER — MIDAZOLAM HCL 5 MG/5ML IJ SOLN
INTRAMUSCULAR | Status: DC | PRN
  Administered 2014-03-12: 2 mg via INTRAVENOUS

## 2014-03-12 MED ORDER — PROPOFOL 10 MG/ML IV BOLUS
INTRAVENOUS | Status: DC | PRN
  Administered 2014-03-12: 50 mg via INTRAVENOUS
  Administered 2014-03-12: 30 mg via INTRAVENOUS

## 2014-03-12 MED ORDER — OXYCODONE-ACETAMINOPHEN 5-325 MG PO TABS: 1.0000 | ORAL_TABLET | ORAL | Status: DC | PRN

## 2014-03-12 MED ORDER — MIDAZOLAM HCL 2 MG/2ML IJ SOLN
INTRAMUSCULAR | Status: AC
  Filled 2014-03-12: qty 2

## 2014-03-12 MED ORDER — VANCOMYCIN HCL IN DEXTROSE 500-5 MG/100ML-% IV SOLN
INTRAVENOUS | Status: AC
  Filled 2014-03-12: qty 100

## 2014-03-12 MED ORDER — ONDANSETRON HCL 4 MG/2ML IJ SOLN
INTRAMUSCULAR | Status: DC | PRN
  Administered 2014-03-12: 4 mg via INTRAVENOUS

## 2014-03-12 MED ORDER — VANCOMYCIN HCL 10 G IV SOLR: 1500.0000 mg | INTRAVENOUS | Status: DC

## 2014-03-12 MED ORDER — MIDAZOLAM HCL 2 MG/2ML IJ SOLN: 1.0000 mg | INTRAMUSCULAR | Status: DC | PRN

## 2014-03-12 MED ORDER — LACTATED RINGERS IV SOLN
INTRAVENOUS | Status: DC
  Administered 2014-03-12: 11:00:00 via INTRAVENOUS

## 2014-03-12 MED ORDER — OXYCODONE HCL 5 MG/5ML PO SOLN: 5.0000 mg | Freq: Once | ORAL | Status: DC | PRN

## 2014-03-12 MED ORDER — LIDOCAINE HCL 1 % IJ SOLN
INTRAMUSCULAR | Status: DC | PRN
  Administered 2014-03-12: 20 mg via INTRADERMAL

## 2014-03-12 MED ORDER — HYDROMORPHONE HCL PF 1 MG/ML IJ SOLN: 0.2500 mg | INTRAMUSCULAR | Status: DC | PRN

## 2014-03-12 MED ORDER — BUPIVACAINE-EPINEPHRINE 0.25% -1:200000 IJ SOLN
INTRAMUSCULAR | Status: DC | PRN
  Administered 2014-03-12: 20 mL

## 2014-03-12 MED ORDER — LIDOCAINE HCL (PF) 1 % IJ SOLN
INTRAMUSCULAR | Status: AC
  Filled 2014-03-12: qty 30

## 2014-03-12 MED ORDER — VANCOMYCIN HCL IN DEXTROSE 1-5 GM/200ML-% IV SOLN
INTRAVENOUS | Status: AC
  Filled 2014-03-12: qty 200

## 2014-03-12 MED ORDER — FENTANYL CITRATE 0.05 MG/ML IJ SOLN
INTRAMUSCULAR | Status: DC | PRN
  Administered 2014-03-12 (×2): 50 ug via INTRAVENOUS

## 2014-03-12 MED ORDER — CHLORHEXIDINE GLUCONATE 4 % EX LIQD: 1.0000 "application " | Freq: Once | CUTANEOUS | Status: DC

## 2014-03-12 MED ORDER — BUPIVACAINE-EPINEPHRINE (PF) 0.25% -1:200000 IJ SOLN
INTRAMUSCULAR | Status: AC
  Filled 2014-03-12: qty 30

## 2014-03-12 MED ORDER — FENTANYL CITRATE 0.05 MG/ML IJ SOLN: 50.0000 ug | INTRAMUSCULAR | Status: DC | PRN

## 2014-03-12 MED ORDER — BUPIVACAINE HCL (PF) 0.25 % IJ SOLN
INTRAMUSCULAR | Status: AC
  Filled 2014-03-12: qty 30

## 2014-03-12 MED ORDER — DEXAMETHASONE SODIUM PHOSPHATE 10 MG/ML IJ SOLN
INTRAMUSCULAR | Status: DC | PRN
  Administered 2014-03-12: 10 mg via INTRAVENOUS

## 2014-03-12 MED ORDER — FENTANYL CITRATE 0.05 MG/ML IJ SOLN
INTRAMUSCULAR | Status: AC
  Filled 2014-03-12: qty 2

## 2014-03-12 SURGICAL SUPPLY — 52 items
ADH SKN CLS APL DERMABOND .7 (GAUZE/BANDAGES/DRESSINGS) ×1
APL SKNCLS STERI-STRIP NONHPOA (GAUZE/BANDAGES/DRESSINGS)
BENZOIN TINCTURE PRP APPL 2/3 (GAUZE/BANDAGES/DRESSINGS) IMPLANT
BLADE 15 SAFETY STRL DISP (BLADE) ×2 IMPLANT
BLADE HEX COATED 2.75 (ELECTRODE) ×2 IMPLANT
BLADE SURG ROTATE 9660 (MISCELLANEOUS) IMPLANT
CANISTER SUCT 1200ML W/VALVE (MISCELLANEOUS) IMPLANT
CHLORAPREP W/TINT 26ML (MISCELLANEOUS) ×2 IMPLANT
COVER MAYO STAND STRL (DRAPES) ×2 IMPLANT
COVER TABLE BACK 60X90 (DRAPES) ×2 IMPLANT
DECANTER SPIKE VIAL GLASS SM (MISCELLANEOUS) ×1 IMPLANT
DERMABOND ADVANCED (GAUZE/BANDAGES/DRESSINGS) ×1
DERMABOND ADVANCED .7 DNX12 (GAUZE/BANDAGES/DRESSINGS) IMPLANT
DRAPE PED LAPAROTOMY (DRAPES) ×3 IMPLANT
DRAPE UTILITY XL STRL (DRAPES) ×2 IMPLANT
DRSG TEGADERM 4X4.75 (GAUZE/BANDAGES/DRESSINGS) IMPLANT
ELECT COATED BLADE 2.86 ST (ELECTRODE) IMPLANT
ELECT REM PT RETURN 9FT ADLT (ELECTROSURGICAL) ×2
ELECTRODE REM PT RTRN 9FT ADLT (ELECTROSURGICAL) ×1 IMPLANT
GAUZE PACKING IODOFORM 1/4X15 (GAUZE/BANDAGES/DRESSINGS) IMPLANT
GAUZE SPONGE 4X4 12PLY STRL (GAUZE/BANDAGES/DRESSINGS) IMPLANT
GLOVE BIO SURGEON STRL SZ7.5 (GLOVE) ×2 IMPLANT
GLOVE BIOGEL PI IND STRL 8 (GLOVE) ×1 IMPLANT
GLOVE BIOGEL PI INDICATOR 8 (GLOVE) ×1
GOWN STRL REUS W/ TWL LRG LVL3 (GOWN DISPOSABLE) ×1 IMPLANT
GOWN STRL REUS W/ TWL XL LVL3 (GOWN DISPOSABLE) ×1 IMPLANT
GOWN STRL REUS W/TWL LRG LVL3 (GOWN DISPOSABLE) ×2
GOWN STRL REUS W/TWL XL LVL3 (GOWN DISPOSABLE) ×2
NDL HYPO 25X1 1.5 SAFETY (NEEDLE) ×1 IMPLANT
NEEDLE HYPO 25X1 1.5 SAFETY (NEEDLE) ×2 IMPLANT
NS IRRIG 1000ML POUR BTL (IV SOLUTION) ×1 IMPLANT
PACK BASIN DAY SURGERY FS (CUSTOM PROCEDURE TRAY) ×2 IMPLANT
PENCIL BUTTON HOLSTER BLD 10FT (ELECTRODE) ×2 IMPLANT
SLEEVE SCD COMPRESS KNEE MED (MISCELLANEOUS) ×1 IMPLANT
SPONGE GAUZE 4X4 12PLY STER LF (GAUZE/BANDAGES/DRESSINGS) IMPLANT
STRIP CLOSURE SKIN 1/2X4 (GAUZE/BANDAGES/DRESSINGS) IMPLANT
SUT ETHILON 2 0 FS 18 (SUTURE) IMPLANT
SUT ETHILON 4 0 PS 2 18 (SUTURE) IMPLANT
SUT MNCRL AB 4-0 PS2 18 (SUTURE) ×2 IMPLANT
SUT SILK 2 0 SH (SUTURE) ×1 IMPLANT
SUT VIC AB 2-0 SH 27 (SUTURE)
SUT VIC AB 2-0 SH 27XBRD (SUTURE) IMPLANT
SUT VICRYL 3-0 CR8 SH (SUTURE) IMPLANT
SUT VICRYL 4-0 PS2 18IN ABS (SUTURE) IMPLANT
SWAB COLLECTION DEVICE MRSA (MISCELLANEOUS) IMPLANT
SYR CONTROL 10ML LL (SYRINGE) ×2 IMPLANT
TOWEL OR 17X24 6PK STRL BLUE (TOWEL DISPOSABLE) ×2 IMPLANT
TOWEL OR NON WOVEN STRL DISP B (DISPOSABLE) ×2 IMPLANT
TUBE ANAEROBIC SPECIMEN COL (MISCELLANEOUS) IMPLANT
TUBE CONNECTING 20X1/4 (TUBING) IMPLANT
UNDERPAD 30X30 INCONTINENT (UNDERPADS AND DIAPERS) IMPLANT
YANKAUER SUCT BULB TIP NO VENT (SUCTIONS) IMPLANT

## 2014-03-12 NOTE — Op Note (Signed)
03/12/2014  12:28 PM  PATIENT:  Franklin Jones  63 y.o. male  PRE-OPERATIVE DIAGNOSIS:  Left lateral neck skin mass  POST-OPERATIVE DIAGNOSIS:  same  PROCEDURE:  Procedure(s): WIDE LOCAL EXCISION OF LEFT LATERAL NECK SKIN MASS (mass 2.1 cm x 2.1cm; total skin incision 11 cm x 2.5cm)  SURGEON:  Surgeon(s): Gayland Curry, MD  ASSISTANTS: none   ANESTHESIA:   MAC and local  DRAINS: none   LOCAL MEDICATIONS USED:  MARCAINE     SPECIMEN:  Source of Specimen:  left lateral neck skin mass, short superior, long posterolateral  DISPOSITION OF SPECIMEN:  PATHOLOGY  COUNTS:  YES  INDICATION FOR PROCEDURE: 63 year old Caucasian male who presented with acute onset of a left lateral neck skin mass. He states that it first developed about 5 weeks ago. It is slowly progressively gotten larger. He denies any trauma to the area. He denies any prior lesion in the area. On exam it was not consistent with epidermoid inclusion cyst. It is suspicious for a potential skin cancer like squamous cell carcinoma. I recommended excisional biopsy. We discussed the risk and benefits of surgery including but not limited to bleeding, infection, injury to surrounding structures, scarring, need for additional procedures, anesthesia concerns.  PROCEDURE: after obtaining informed consent and marking the side of the neck with the patient agreeing he was taken to operating room 6 at [facility]. Monitored anesthesia care was established. He was placed in the lateral position with the left side up. The left side of his neck was prepped and draped in the usual standard surgical fashion with Chloraprep. He received vancomycin prior to skin incision. A surgical timeout was performed. The lesion measured 2.1 cm x 2.1 cm. In planning a elliptical incision the total length of incision was going to be around 11 cm incorporating 2 mm margins. Local was infiltrated along the proposed skin incision lines. The 15 mm  scalpel was used to make an elliptical incision. This incorporated a 2 mm skin margins on each side. The dermis was divided with the scalpel. Then using Bovie electrocautery lifted the skin of the subcutaneous fat in a sequential fashion. There is one area that was loosely attached to the underlying muscle. The entire skin ellipse and mass was excised in its entirety. It was marked with suture. The wound is here gated. Hemostasis was achieved. The dermis was reapproximated with inverted interrupted 3-0 Vicryl sutures. Skin was reapproximated with a running 4-0 Monocryl suture in a subcuticular fashion. Dermabond was applied. All needle, instrument, and sponge counts were correct x2. There were no immediate complications. The patient tolerated the procedure well. He was taken to the recovery room in stable condition  PLAN OF CARE: Discharge to home after PACU  PATIENT DISPOSITION:  PACU - hemodynamically stable.   Delay start of Pharmacological VTE agent (>24hrs) due to surgical blood loss or risk of bleeding:  not applicable  [name]. Redmond Pulling, MD, FACS General, Bariatric, & Minimally Invasive Surgery [facility], [place]

## 2014-03-12 NOTE — Discharge Instructions (Signed)
[place] Office Phone Number [phone]   POST OP INSTRUCTIONS  Always review your discharge instruction sheet given to you by the facility where your surgery was performed.  IF YOU HAVE DISABILITY OR FAMILY LEAVE FORMS, YOU MUST BRING THEM TO THE OFFICE FOR PROCESSING.  DO NOT GIVE THEM TO YOUR DOCTOR.  1. A prescription for pain medication may be given to you upon discharge.  Take your pain medication as prescribed, if needed.  If narcotic pain medicine is not needed, then you may take acetaminophen (Tylenol) or ibuprofen (Advil) as needed. 2. Take your usually prescribed medications unless otherwise directed 3. If you need a refill on your pain medication, please contact your pharmacy.  They will contact our office to request authorization.  Prescriptions will not be filled after 5pm or on week-ends. 4. You should eat very light the first 24 hours after surgery, such as soup, crackers, pudding, etc.  Resume your normal diet the day after surgery. 5. Most patients will experience some swelling and bruising in the incision.  Ice packs will help.  Swelling and bruising can take several days to resolve.  6. It is common to experience some constipation if taking pain medication after surgery.  Increasing fluid intake and taking a stool softener will usually help or prevent this problem from occurring.  A mild laxative (Milk of Magnesia or Miralax) should be taken according to package directions if there are no bowel movements after 48 hours. 7.  If your surgeon used skin glue on the incision, you may shower in 24 hours.  The glue will flake off over the next 2-3 weeks.  Any sutures or staples will be removed at the office during your follow-up visit. 8. ACTIVITIES:  You may resume regular daily activities (gradually increasing) beginning the next day.   You may have sexual intercourse when it is comfortable. a. You may drive when you no longer are taking prescription pain  medication, you can comfortably wear a seatbelt, and you can safely maneuver your car and apply brakes. b. RETURN TO WORK:   9. You should see your doctor in the office for a follow-up appointment approximately two weeks after your surgery.  Your doctors nurse will typically make your follow-up appointment when she calls you with your pathology report.  Expect your pathology report 2-3 business days after your surgery.  You may call to check if you do not hear from [place] after three days. OTHER INSTRUCTIONS: no aggressive neck exercises WHEN TO CALL YOUR DOCTOR: 1. Fever over 101.0 2. Nausea and/or vomiting. 3. Extreme swelling or bruising. 4. Continued bleeding from incision. 5. Increased pain, redness, or drainage from the incision.  The clinic staff is available to answer your questions during regular business hours.  Please dont hesitate to call and ask to speak to one of the nurses for clinical concerns.  If you have a medical emergency, go to the nearest emergency room or call 911.  A surgeon from [facility] is always on call at the hospital.  For further questions, please visit [email or web address]

## 2014-03-12 NOTE — Anesthesia Procedure Notes (Signed)
Procedure Name: MAC Date/Time: 03/12/2014 11:47 AM Performed by: Marrianne Mood Pre-anesthesia Checklist: Patient identified, Timeout performed, Emergency Drugs available, Suction available and Patient being monitored Patient Re-evaluated:Patient Re-evaluated prior to inductionOxygen Delivery Method: Simple face mask Comments: Spontaneous respirations maintained throughout

## 2014-03-12 NOTE — Anesthesia Postprocedure Evaluation (Signed)
  Anesthesia Post-op Note  Patient: Franklin Jones  Procedure(s) Performed: Procedure(s): EXCISION LEFT NECK SKIN MASS (Left)  Patient Location: PACU  Anesthesia Type:MAC  Level of Consciousness: awake, alert  and oriented  Airway and Oxygen Therapy: Patient Spontanous Breathing  Post-op Pain: none  Post-op Assessment: Post-op Vital signs reviewed  Post-op Vital Signs: Reviewed  Last Vitals:  Filed Vitals:   03/12/14 1245  BP: 107/75  Pulse: 76  Temp:   Resp: 11    Complications: No apparent anesthesia complications

## 2014-03-12 NOTE — Transfer of Care (Signed)
Immediate Anesthesia Transfer of Care Note  Patient: Franklin Jones  Procedure(s) Performed: Procedure(s): EXCISION LEFT NECK SKIN MASS (Left)  Patient Location: PACU  Anesthesia Type:MAC  Level of Consciousness: awake, alert , oriented and patient cooperative  Airway & Oxygen Therapy: Patient Spontanous Breathing and Patient connected to face mask oxygen  Post-op Assessment: Report given to PACU RN and Post -op Vital signs reviewed and stable  Post vital signs: Reviewed and stable  Complications: No apparent anesthesia complications

## 2014-03-12 NOTE — H&P (View-Only) (Signed)
Patient ID: Franklin Jones, male   DOB: 07-Apr-1951, 64 y.o.   MRN: [id number]  Chief Complaint  Patient presents with  . New Evaluation    eval seb cyst on neck    HPI Franklin Jones is a 63 y.o. male.   HPI 63 yo WM referred by Dr Woody Seller for a left neck skin mass. The patient states the area abruptly developed about 5-6 weeks ago. He reports that several months ago there was nothing in the area. His wife doesn't recall any particular mole or growth in that area. He states it has slowly gotten larger over time. It hasn't really drained any fluid per se. It does cause some minor discomfort if you press on the area. He denies any trauma to the area. He denies any weight loss. He denies any fevers or chills. He denies any personal history of skin cancers. He did have 3-4 ticks on his lower legs. He has been on antibiotics for a few days without any change in the lesion. Past Medical History  Diagnosis Date  . Allergic rhinitis   . History of asbestos exposure     followed by Dr West Carbo- states stable years- last CT chest 7/12 in Epic  . Arthritis   . Degenerative arthritis of knee 11/19/2011  . Shortness of breath     SOME SOB BUT CAN CLIMB FLIGHT OF STAIRS    Past Surgical History  Procedure Laterality Date  . Left finger amputation    . Fracture surgery      traumatic injury left small finger with amputation  . Knee arthroplasty  11/19/2011    Procedure: COMPUTER ASSISTED TOTAL KNEE ARTHROPLASTY;  Surgeon: Mcarthur Rossetti, MD;  Location: [facility];  Service: Orthopedics;  Laterality: Right;  Right Total Knee Arthroplasty  . Total knee arthroplasty  08/18/2012    Procedure: TOTAL KNEE ARTHROPLASTY;  Surgeon: Mcarthur Rossetti, MD;  Location: [facility];  Service: Orthopedics;  Laterality: Left;  Left Total Knee Arthroplasty    Family History  Problem Relation Age of Onset  . Allergies Mother   . Heart disease Father   . Lung cancer Brother     Social History History  Substance Use  Topics  . Smoking status: Former Smoker -- 1.00 packs/day for 20 years    Types: Cigarettes    Quit date: 10/18/1985  . Smokeless tobacco: Never Used  . Alcohol Use: 3.0 oz/week    6 drink(s) per week    Allergies  Allergen Reactions  . Penicillins     REACTION: rash    Current Outpatient Prescriptions  Medication Sig Dispense Refill  . omeprazole (PRILOSEC) 20 MG capsule Take 1 capsule (20 mg total) by mouth daily.  30 capsule  0  . acetaminophen (TYLENOL) 500 MG tablet Take 1,000 mg by mouth every 6 (six) hours as needed.      [name] Kitchen aspirin 325 MG tablet Take 1 tablet (325 mg total) by mouth 2 (two) times daily.  60 tablet  0  . fexofenadine (ALLEGRA) 180 MG tablet Take 180 mg by mouth daily.      . Glucosamine-Chondroit-Vit C-Mn (GLUCOSAMINE CHONDR 1500 COMPLX) CAPS Take 2 capsules by mouth daily.       . sildenafil (VIAGRA) 100 MG tablet Take 100 mg by mouth daily as needed for erectile dysfunction.       No current facility-administered medications for this visit.    Review of Systems Review of Systems  Constitutional: Negative for fever, chills,  appetite change and unexpected weight change.  HENT: Positive for hearing loss. Negative for congestion and trouble swallowing.   Eyes: Negative for visual disturbance.  Respiratory: Negative for chest tightness and shortness of breath.   Cardiovascular: Negative for chest pain and leg swelling.       No PND, no orthopnea, no DOE  Gastrointestinal:       See HPI  Genitourinary: Negative for dysuria and hematuria.  Musculoskeletal: Negative.        OA pains  Skin: Negative for rash.  Neurological: Negative for seizures and speech difficulty.  Hematological: Does not bruise/bleed easily.  Psychiatric/Behavioral: Negative for behavioral problems and confusion.    Blood pressure 142/100, pulse 82, temperature 97.8 F (36.6 C), temperature source Temporal, resp. rate 16, height 6\' 2"  (1.88 m), weight 258 lb (117.028  kg).  Physical Exam Physical Exam  Vitals reviewed. Constitutional: He is oriented to person, place, and time. He appears well-developed and well-nourished. No distress.  HENT:  Head: Normocephalic and atraumatic.  Right Ear: External ear normal.  Left Ear: External ear normal.  Eyes: Conjunctivae are normal. No scleral icterus.  Neck: Normal range of motion. Neck supple. No tracheal deviation present. No thyromegaly present.    Left posterior lateral neck skin mass, raised borders with central ulceration, well circumscribed. 2 x 2cm. Nonmobile. No cellulitis, fluctuance, induration. Not terribly tender  Cardiovascular: Normal rate, normal heart sounds and intact distal pulses.   Pulmonary/Chest: Effort normal and breath sounds normal. No respiratory distress. He has no wheezes.  Abdominal: He exhibits no distension.  Musculoskeletal: Normal range of motion. He exhibits no edema and no tenderness.  Lymphadenopathy:    He has no cervical adenopathy.  Neurological: He is alert and oriented to person, place, and time. He exhibits normal muscle tone.  Skin: Skin is warm and dry. No rash noted. He is not diaphoretic. No erythema. No pallor.  Fair skin, multiple sun spots  Psychiatric: He has a normal mood and affect. His behavior is normal. Judgment and thought content normal.      Data Reviewed Dr Woody Seller' note   Assessment    Left posterior neck skin mass     Plan    The etiology of the skin mass is unclear. It is not really consistent with sebaceous cyst. The fact of its rapid growth and abrupt onset suggests a more benign etiology or an inflammatory process. However a skin cancer or neoplasm needs to be ruled out. Therefore I've recommended excisional biopsy of his left posterior neck skin mass. We discussed the risks and benefits of surgery including but not limited to bleeding, infection, injury to surrounding structures, scarring, need for additional procedures, wound healing  problems, hematoma formation, anesthesia complications, and numbness. We discussed the length of the incision would be longer than the actual size of the mass. He will meet with our scheduler today to get scheduled for surgery in the near future  Mateja Dier M. Redmond Pulling, MD, FACS General, Bariatric, & Minimally Invasive Surgery [facility], [place]         Gayland Curry 03/07/2014, 3:36 PM

## 2014-03-12 NOTE — Anesthesia Preprocedure Evaluation (Addendum)
Anesthesia Evaluation  Patient identified by MRN, date of birth, ID band Patient awake    Reviewed: Allergy & Precautions, H&P , NPO status , Patient's Chart, lab work & pertinent test results  Airway Mallampati: II TM Distance: >3 FB Neck ROM: Full    Dental no notable dental hx. (+) Teeth Intact, Dental Advisory Given   Pulmonary shortness of breath, former smoker,  breath sounds clear to auscultation  Pulmonary exam normal       Cardiovascular negative cardio ROS  Rhythm:Regular Rate:Normal     Neuro/Psych negative neurological ROS  negative psych ROS   GI/Hepatic Neg liver ROS, GERD-  Medicated and Controlled,  Endo/Other  negative endocrine ROS  Renal/GU negative Renal ROS  negative genitourinary   Musculoskeletal   Abdominal   Peds  Hematology negative hematology ROS (+)   Anesthesia Other Findings   Reproductive/Obstetrics negative OB ROS                          Anesthesia Physical Anesthesia Plan  ASA: II  Anesthesia Plan: MAC   Post-op Pain Management:    Induction: Intravenous  Airway Management Planned: Simple Face Mask  Additional Equipment:   Intra-op Plan:   Post-operative Plan:   Informed Consent: I have reviewed the patients History and Physical, chart, labs and discussed the procedure including the risks, benefits and alternatives for the proposed anesthesia with the patient or authorized representative who has indicated his/her understanding and acceptance.   Dental advisory given  Plan Discussed with: CRNA  Anesthesia Plan Comments:         Anesthesia Quick Evaluation

## 2014-03-12 NOTE — Interval H&P Note (Signed)
History and Physical Interval Note:  03/12/2014 11:22 AM  Franklin Jones  has presented today for surgery, with the diagnosis of wide skin mass  The various methods of treatment have been discussed with the patient and family. After consideration of risks, benefits and other options for treatment, the patient has consented to  Procedure(s): EXCISION LEFT NECK SKIN MASS (Left) as a surgical intervention .  The patient's history has been reviewed, patient examined, no change in status, stable for surgery.  I have reviewed the patient's chart and labs.  Questions were answered to the patient's satisfaction.    [name]. Redmond Pulling, MD, [place], Bariatric, & Minimally Invasive Surgery [facility], [place]   Gayland Curry

## 2014-03-13 ENCOUNTER — Telehealth (INDEPENDENT_AMBULATORY_CARE_PROVIDER_SITE_OTHER): Payer: Self-pay | Admitting: General Surgery

## 2014-03-13 NOTE — Telephone Encounter (Signed)
Called to discuss path report with pt. 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 Explained he would need referral to dermatology to establish care and to be set up for skin surveillance. Pt requests Dr Allyson Sabal (his wife's dermatologist). Will also move up his followup appt with me to 2 weeks instead of 4 weeks

## 2014-03-13 NOTE — Telephone Encounter (Signed)
I called [name] and I moved his apt up on 03-28-2014 @ 4:30. And the patient started that he has already called Dr Allyson Sabal office and he will make his own office visit apt himself. I told him if Dr Allyson Sabal office needs to call me they can and make sure that they call and ask for Franklin Jones

## 2014-03-13 NOTE — Addendum Note (Signed)
Addendum created 03/13/14 1325 by Tawni Millers, CRNA   Modules edited: Charges VN

## 2014-03-14 ENCOUNTER — Encounter (HOSPITAL_BASED_OUTPATIENT_CLINIC_OR_DEPARTMENT_OTHER): Payer: Self-pay | Admitting: General Surgery

## 2014-03-28 ENCOUNTER — Encounter (INDEPENDENT_AMBULATORY_CARE_PROVIDER_SITE_OTHER): Payer: Self-pay | Admitting: General Surgery

## 2014-03-28 ENCOUNTER — Ambulatory Visit (INDEPENDENT_AMBULATORY_CARE_PROVIDER_SITE_OTHER): Payer: 59 | Admitting: General Surgery

## 2014-03-28 VITALS — BP 130/75 | HR 82 | Temp 97.1°F | Resp 14 | Ht 74.0 in | Wt 260.6 lb

## 2014-03-28 DIAGNOSIS — C4442 Squamous cell carcinoma of skin of scalp and neck: Secondary | ICD-10-CM

## 2014-03-28 DIAGNOSIS — C760 Malignant neoplasm of head, face and neck: Secondary | ICD-10-CM

## 2014-03-28 NOTE — Patient Instructions (Signed)
Keep appt with dermatologist Use sunscreen - protect skin

## 2014-03-29 NOTE — Progress Notes (Signed)
Subjective:     Patient ID: Franklin Jones, male   DOB: 08/03/1951, 63 y.o.   MRN: [id number]  HPI 63 year old gentleman comes in today for followup after undergoing excision of a left lateral neck skin mass. He underwent surgery on May 26. As expected they came back as a squamous cell carcinoma 2 cm, moderately differentiated. He states that he is doing well. He denies any fevers or chills. Denies any drainage from the wound. He denies any neck pain.  Review of Systems     Objective:   Physical Exam BP 130/75  Pulse 82  Temp(Src) 97.1 F (36.2 C)  Resp 14  Ht 6\' 2"  (1.88 m)  Wt 260 lb 9.6 oz (118.207 kg)  BMI 33.44 kg/m2 Alert, no apparent distress Neck-almost completely healed left postero- lateral neck oblique incision. 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 There is no sign of infection. We discussed his pathology. He was given a copy of his pathology report. He has already been referred to dermatology. His margins were clear. We discussed the importance of sunscreen. We discussed the importance of him following up with dermatologist get a baseline skin exam and annual skin surveillance. We discussed the possibility of local recurrence. Followup as needed  [name]. Redmond Pulling, MD, FACS General, Bariatric, & Minimally Invasive Surgery [facility], [place]

## 2014-04-11 ENCOUNTER — Encounter (INDEPENDENT_AMBULATORY_CARE_PROVIDER_SITE_OTHER): Payer: 59 | Admitting: General Surgery

## 2014-04-25 ENCOUNTER — Telehealth: Payer: Self-pay | Admitting: Emergency Medicine

## 2014-04-25 DIAGNOSIS — R930 Abnormal findings on diagnostic imaging of skull and head, not elsewhere classified: Secondary | ICD-10-CM

## 2014-04-25 NOTE — Telephone Encounter (Signed)
Pt is scheduled to see RB 06/07/14. Pt reports usually before he see's RB he likes for him to have PFT done and cxr/CT done. Pt last PFT was 06/06/13 and CT as well. Please advise RB thanks

## 2014-04-26 NOTE — Telephone Encounter (Signed)
Yes please order a High Res Ct scan (no contrast) and full PFT before that visit.

## 2014-04-26 NOTE — Telephone Encounter (Signed)
Per RB orders placed for full pulmonary test and high resolution CT. These test to be done prior to OV 06/07/14. Orders placed and pt aware that he will be contacted to schedule these test. Nothing further needed

## 2014-05-02 ENCOUNTER — Encounter (INDEPENDENT_AMBULATORY_CARE_PROVIDER_SITE_OTHER): Payer: Self-pay | Admitting: *Deleted

## 2014-05-20 ENCOUNTER — Other Ambulatory Visit (INDEPENDENT_AMBULATORY_CARE_PROVIDER_SITE_OTHER): Payer: Self-pay | Admitting: *Deleted

## 2014-05-20 ENCOUNTER — Encounter (INDEPENDENT_AMBULATORY_CARE_PROVIDER_SITE_OTHER): Payer: Self-pay | Admitting: *Deleted

## 2014-05-20 ENCOUNTER — Telehealth (INDEPENDENT_AMBULATORY_CARE_PROVIDER_SITE_OTHER): Payer: Self-pay | Admitting: *Deleted

## 2014-05-20 DIAGNOSIS — Z1211 Encounter for screening for malignant neoplasm of colon: Secondary | ICD-10-CM

## 2014-05-20 NOTE — Telephone Encounter (Signed)
Patient needs movi prep 

## 2014-05-22 MED ORDER — PEG-KCL-NACL-NASULF-NA ASC-C 100 G PO SOLR: 1.0000 | Freq: Once | ORAL | Status: DC

## 2014-06-07 ENCOUNTER — Inpatient Hospital Stay: Admission: RE | Admit: 2014-06-07 | Payer: Self-pay | Source: Ambulatory Visit

## 2014-06-07 ENCOUNTER — Ambulatory Visit: Payer: Self-pay | Admitting: Emergency Medicine

## 2014-06-10 ENCOUNTER — Ambulatory Visit (INDEPENDENT_AMBULATORY_CARE_PROVIDER_SITE_OTHER): Payer: Worker's Compensation | Admitting: Emergency Medicine

## 2014-06-10 ENCOUNTER — Encounter: Payer: Self-pay | Admitting: Emergency Medicine

## 2014-06-10 ENCOUNTER — Ambulatory Visit (INDEPENDENT_AMBULATORY_CARE_PROVIDER_SITE_OTHER)
Admission: RE | Admit: 2014-06-10 | Discharge: 2014-06-10 | Disposition: A | Discharge status: Home / Self Care | Payer: Worker's Compensation | Source: Ambulatory Visit | Attending: Emergency Medicine | Admitting: Emergency Medicine

## 2014-06-10 VITALS — BP 132/76 | HR 96 | Ht 73.0 in | Wt 263.0 lb

## 2014-06-10 DIAGNOSIS — R930 Abnormal findings on diagnostic imaging of skull and head, not elsewhere classified: Secondary | ICD-10-CM

## 2014-06-10 DIAGNOSIS — Z77090 Contact with and (suspected) exposure to asbestos: Secondary | ICD-10-CM

## 2014-06-10 LAB — PULMONARY FUNCTION TEST
DL/VA % pred: 71 %
DL/VA: 3.45 ml/min/mmHg/L
DLCO UNC % PRED: 63 %
DLCO unc: 23.68 ml/min/mmHg
FEF 25-75 POST: 2.98 L/s
FEF 25-75 Pre: 1.98 L/sec
FEF2575-%Change-Post: 50 %
FEF2575-%Pred-Post: 95 %
FEF2575-%Pred-Pre: 63 %
FEV1-%Change-Post: 8 %
FEV1-%PRED-POST: 86 %
FEV1-%Pred-Pre: 79 %
FEV1-PRE: 3.14 L
FEV1-Post: 3.41 L
FEV1FVC-%Change-Post: 6 %
FEV1FVC-%PRED-PRE: 95 %
FEV6-%Change-Post: 1 %
FEV6-%PRED-PRE: 85 %
FEV6-%Pred-Post: 87 %
FEV6-POST: 4.37 L
FEV6-Pre: 4.29 L
FEV6FVC-%CHANGE-POST: 0 %
FEV6FVC-%Pred-Post: 103 %
FEV6FVC-%Pred-Pre: 103 %
FVC-%CHANGE-POST: 1 %
FVC-%PRED-PRE: 83 %
FVC-%Pred-Post: 84 %
FVC-Post: 4.44 L
FVC-Pre: 4.36 L
PRE FEV6/FVC RATIO: 98 %
Post FEV1/FVC ratio: 77 %
Post FEV6/FVC ratio: 98 %
Pre FEV1/FVC ratio: 72 %
RV % pred: 111 %
RV: 2.81 L
TLC % pred: 94 %
TLC: 7.25 L

## 2014-06-10 NOTE — Assessment & Plan Note (Signed)
His Ct scan and PFT are stable compared with priors > no evidence ILD or asbestos manifestations. Because he has a hx tobacco and because at least one of his nodules is > 55mm, I would like to follow his CT scan in a year. We will also repeat his PFT at that time.

## 2014-06-10 NOTE — Progress Notes (Signed)
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 Was discovered to have an abnormal CXR, around 2004. No other symptoms at that time. Over time he has developed some exertional dyspnea. Has never had a CT scan of the chest. He has done spirometry and CXR's   ROV 04/09/10 -- follows up to today to review Ct scan and PFT in setting of asbestos exposure. Other ROS - mentions sinus pain/pressure, nasal congestion, clear drainage.   ROV 04/28/11 -- follow up visit, hx asbestos exposure. Had PFT and CT scan today. 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 PFT as below. He has noticed a slight decrease in functional capacity - related this to knee pain and decreased activity level, decreased conditioning.   ROV 05/04/12 -- follow up visit, hx asbestos exposure. Had PFT and CT scan today. 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 Returns feeling well. PFT today are stable - FEV1 3.24L. No new symptoms.   ROV 06/06/13 -- 63 yo man, hx asbestos exposure. Underwent CT scan chest and PFT today. Both are stable. He does have some mild to mod AFL with probable BD response. He c/o GERD sx today. He used to take aleve frequently, stopped 3 weeks ago.   ROV 06/10/14 -- pleasant 63 yo man, follows for mild asthma and asbestos exposure. He also has allergies / rhinitis. PFT done today 8/24 > stable compared with 06/06/13. He is feeling well. Never needed the SABA. He has retired - feels he needs more exercise. He has nasal drainage, restarted decongestants. CT scan shows stable tiny nodules compared with last year. Needs to go to [facility] in [place].   Filed Vitals:   06/10/14 1550  BP: 132/76  Pulse: 96  Height: 6\' 1"  (1.854 m)  Weight: 263 lb (119.296 kg)  SpO2: 100%   Gen: Pleasant, well-nourished, in no distress,  normal affect  ENT: No lesions,  mouth clear,  oropharynx clear, no postnasal drip  Neck: No JVD, no TMG, no carotid  bruits  Lungs: No use of accessory muscles, no dullness to percussion, clear without rales or rhonchi  Cardiovascular: RRR, heart sounds normal, no murmur or gallops, no peripheral edema  Musculoskeletal: No deformities, no cyanosis or clubbing  Neuro: alert, non focal  Skin: Warm, no lesions or rashes  PULMONARY FUNCTON TEST 04/09/2010 04/28/2011 05/04/2012 06/06/2013  FVC 4.98 4.51 4.42 4.27  FEV1 3.68 3.2 3.24 3.1  FEV1/FVC 73.9 71 73.3 72.6  FVC  % Predicted 92 87 85 80  FEV % Predicted 90 88 90 78  FeF 25-75 2.66 2.01 2.32 4.09  FeF 25-75 % Predicted 3.32 3.29 3.25 5.04     06/06/13 --  Comparison: 04/28/2011  Findings:  No pleural effusions identified. There are no pleural plaques  noted. No airspace consolidation identified. There are no pleural  plaques identified.  Subpleural nodule along the minor fissure of the right lung  measures 8 mm, image 27/series 5. This is unchanged from previous  exam. Right middle lobe parenchymal nodule measures 7 mm and is  unchanged from previous exam, image 34/series 5. Within the left  upper lobe there is a pulmonary nodule which measures 4 mm, image  14/series 4. Within the left lower lobe there is a 3 mm nodule,  image 47/series 5. This is unchanged from previous exam. In the  right lower lobe there is a 4.3 mm nodule which is stable from  prior exam, image 40/series 5.  No new or enlarging pulmonary  nodules or masses identified.  On the high resolution images there is evidence of traction  bronchiectasis. Mild bilateral subpleural reticulation is  identified. No honeycombing. No abnormal areas of ground-glass  attenuation identified. On the expiration images there is no  significant air trapping identified.  The heart size appears normal. Calcifications are noted within the  LAD and left circumflex coronary artery. No enlarged mediastinal  or hilar lymph nodes identified. No pericardial effusion. No  enlarged axillary or  supraclavicular lymph nodes.  Limited imaging through the upper abdomen is negative for acute  findings.  Review of the visualized osseous structures is significant for mild  thoracic spondylosis.  IMPRESSION:  1. Multiple, bilateral pulmonary nodules are unchanged when  compared with 04/28/2011. These are most likely benign.  2. Evidence of subpleural reticulation and traction  bronchiectasis. 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 Because he has a hx tobacco and because at least one of his nodules is > 49mm, I would like to follow his CT scan in a year. We will also repeat his PFT at that time.

## 2014-06-10 NOTE — Progress Notes (Signed)
PFT done today. 

## 2014-06-10 NOTE — Patient Instructions (Signed)
Your CT scan and pulmonary function testing are stable compared with last year.  We will need to repeat PFT's and a high resolution CT scan (no contrast) next year at this same time.  Follow with Dr Lamonte Sakai in 12 months or sooner if you have any problems

## 2014-07-19 ENCOUNTER — Telehealth (INDEPENDENT_AMBULATORY_CARE_PROVIDER_SITE_OTHER): Payer: Self-pay | Admitting: *Deleted

## 2014-07-19 DIAGNOSIS — Z1211 Encounter for screening for malignant neoplasm of colon: Secondary | ICD-10-CM

## 2014-07-19 NOTE — Telephone Encounter (Signed)
Patient needs movi prep 

## 2014-07-22 MED ORDER — PEG-KCL-NACL-NASULF-NA ASC-C 100 G PO SOLR: 1.0000 | Freq: Once | ORAL | Status: DC

## 2014-07-25 ENCOUNTER — Telehealth (INDEPENDENT_AMBULATORY_CARE_PROVIDER_SITE_OTHER): Payer: Self-pay | Admitting: *Deleted

## 2014-07-25 NOTE — Telephone Encounter (Signed)
PCP: vyas   Procedure: tcs  Reason/Indication:  screening  Has patient had this procedure before?  Yes, around 10 years ago  If so, when, by whom and where?    Is there a family history of colon cancer?  no  Who?  What age when diagnosed?    Is patient diabetic?   no      Does patient have prosthetic heart valve?  no  Do you have a pacemaker?  no  Has patient ever had endocarditis? no  Has patient had joint replacement within last 12 months?  no  Does patient tend to be constipated or take laxatives? no  Is patient on Coumadin, Plavix and/or Aspirin? no  Medications: omeprazole 40 mg daily  Allergies: pcn  Medication Adjustment:   Procedure date & time: 08/22/14 at 830

## 2014-07-25 NOTE — Telephone Encounter (Signed)
agree

## 2014-08-09 ENCOUNTER — Encounter (HOSPITAL_COMMUNITY): Payer: Self-pay | Admitting: Pharmacy Technician

## 2014-08-22 ENCOUNTER — Ambulatory Visit (HOSPITAL_COMMUNITY)
Admission: RE | Admit: 2014-08-22 | Discharge: 2014-08-22 | Disposition: A | Discharge status: Home / Self Care | Payer: 59 | Source: Ambulatory Visit | Attending: Internal Medicine | Admitting: Internal Medicine

## 2014-08-22 ENCOUNTER — Encounter (HOSPITAL_COMMUNITY)
Admission: RE | Disposition: A | Discharge status: Home / Self Care | Payer: Self-pay | Source: Ambulatory Visit | Attending: Internal Medicine

## 2014-08-22 ENCOUNTER — Encounter (HOSPITAL_COMMUNITY): Payer: Self-pay | Admitting: *Deleted

## 2014-08-22 DIAGNOSIS — I10 Essential (primary) hypertension: Secondary | ICD-10-CM | POA: Diagnosis not present

## 2014-08-22 DIAGNOSIS — Z1211 Encounter for screening for malignant neoplasm of colon: Secondary | ICD-10-CM

## 2014-08-22 DIAGNOSIS — D124 Benign neoplasm of descending colon: Secondary | ICD-10-CM | POA: Diagnosis not present

## 2014-08-22 DIAGNOSIS — K644 Residual hemorrhoidal skin tags: Secondary | ICD-10-CM | POA: Diagnosis not present

## 2014-08-22 DIAGNOSIS — Z79899 Other long term (current) drug therapy: Secondary | ICD-10-CM | POA: Diagnosis not present

## 2014-08-22 DIAGNOSIS — K5720 Diverticulitis of large intestine with perforation and abscess without bleeding: Secondary | ICD-10-CM

## 2014-08-22 DIAGNOSIS — K5730 Diverticulosis of large intestine without perforation or abscess without bleeding: Secondary | ICD-10-CM | POA: Insufficient documentation

## 2014-08-22 DIAGNOSIS — K219 Gastro-esophageal reflux disease without esophagitis: Secondary | ICD-10-CM | POA: Diagnosis not present

## 2014-08-22 DIAGNOSIS — K649 Unspecified hemorrhoids: Secondary | ICD-10-CM

## 2014-08-22 HISTORY — PX: COLONOSCOPY: SHX5424

## 2014-08-22 HISTORY — DX: Gastro-esophageal reflux disease without esophagitis: K21.9

## 2014-08-22 HISTORY — DX: Essential (primary) hypertension: I10

## 2014-08-22 SURGERY — COLONOSCOPY
Anesthesia: Moderate Sedation

## 2014-08-22 MED ORDER — SODIUM CHLORIDE 0.9 % IV SOLN
INTRAVENOUS | Status: DC
  Administered 2014-08-22: 08:00:00 via INTRAVENOUS

## 2014-08-22 MED ORDER — MEPERIDINE HCL 50 MG/ML IJ SOLN
INTRAMUSCULAR | Status: AC
  Filled 2014-08-22: qty 1

## 2014-08-22 MED ORDER — MIDAZOLAM HCL 5 MG/5ML IJ SOLN
INTRAMUSCULAR | Status: AC
  Filled 2014-08-22: qty 10

## 2014-08-22 MED ORDER — STERILE WATER FOR IRRIGATION IR SOLN
Status: DC | PRN
  Administered 2014-08-22: 09:00:00

## 2014-08-22 MED ORDER — MEPERIDINE HCL 50 MG/ML IJ SOLN
INTRAMUSCULAR | Status: DC | PRN
  Administered 2014-08-22: 25 mg via INTRAVENOUS

## 2014-08-22 MED ORDER — MIDAZOLAM HCL 5 MG/5ML IJ SOLN
INTRAMUSCULAR | Status: DC | PRN
  Administered 2014-08-22 (×2): 1 mg via INTRAVENOUS
  Administered 2014-08-22: 2 mg via INTRAVENOUS

## 2014-08-22 NOTE — Op Note (Signed)
COLONOSCOPY PROCEDURE REPORT  PATIENT:  DEMTRIUS ROUNDS  MR#:  [id number] Birthdate:  [birth year], 63 y.o., male Endoscopist:  Dr. Rogene [place], MD Referred By:  Dr. Glenda Chroman, MD Procedure Date: 08/22/2014  Procedure:   Colonoscopy  Indications:  Patient is 63 year old Caucasian male who is undergoing average risk screening colonoscopy.  Last exam was 10 years ago.  Informed Consent:  The procedure and risks were reviewed with the patient and informed consent was obtained.  Medications:  Demerol 25 mg IV Versed 4 mg IV  Description of procedure:  After a digital rectal exam was performed, that colonoscope was advanced from the anus through the rectum and colon to the area of the cecum, ileocecal valve and appendiceal orifice. The cecum was deeply intubated. These structures were well-seen and photographed for the record. From the level of the cecum and ileocecal valve, the scope was slowly and cautiously withdrawn. The mucosal surfaces were carefully surveyed utilizing scope tip to flexion to facilitate fold flattening as needed. The scope was pulled down into the rectum where a thorough exam including retroflexion was performed.  Findings:  Prep excellent. 3 mm polyp ablated via cold biopsy from descending colon. Few diverticula at sigmoid colon. Small hemorrhoids above the dentate line.   Therapeutic/Diagnostic Maneuvers Performed:  See above  Complications:  none  Cecal Withdrawal Time:  11 minutes  Impression:  Examination performed to cecum. Mild sigmoid colon diverticulosis. 3 mm polyp ablated via cold biopsy from descending colon.  Recommendations:  Standard instructions given. I will contact patient with biopsy results and further recommendations.   REHMAN,NAJEEB U  08/22/2014 9:25 AM  CC: Dr. Glenda Chroman., MD & Dr. Rayne Du ref. provider found

## 2014-08-22 NOTE — Discharge Instructions (Signed)
Resume usual medications and high fiber diet. No driving for 24 hours. Physician will call with biopsy results.  Colonoscopy, Care After Refer to this sheet in the next few weeks. These instructions provide you with information on caring for yourself after your procedure. Your health care provider may also give you more specific instructions. Your treatment has been planned according to current medical practices, but problems sometimes occur. Call your health care provider if you have any problems or questions after your procedure. WHAT TO EXPECT AFTER THE PROCEDURE  After your procedure, it is typical to have the following:  A small amount of blood in your stool.  Moderate amounts of gas and mild abdominal cramping or bloating. HOME CARE INSTRUCTIONS  Do not drive, operate machinery, or sign important documents for 24 hours.  You may shower and resume your regular physical activities, but move at a slower pace for the first 24 hours.  Take frequent rest periods for the first 24 hours.  Walk around or put a warm pack on your abdomen to help reduce abdominal cramping and bloating.  Drink enough fluids to keep your urine clear or pale yellow.  You may resume your normal diet as instructed by your health care provider. Avoid heavy or fried foods that are hard to digest.  Avoid drinking alcohol for 24 hours or as instructed by your health care provider.  Only take over-the-counter or prescription medicines as directed by your health care provider.  If a tissue sample (biopsy) was taken during your procedure:  Do not take aspirin or blood thinners for 7 days, or as instructed by your health care provider.  Do not drink alcohol for 7 days, or as instructed by your health care provider.  Eat soft foods for the first 24 hours. SEEK MEDICAL CARE IF: You have persistent spotting of blood in your stool 2-3 days after the procedure. SEEK IMMEDIATE MEDICAL CARE IF:  You have more than a  small spotting of blood in your stool.  You pass large blood clots in your stool.  Your abdomen is swollen (distended).  You have nausea or vomiting.  You have a fever.  You have increasing abdominal pain that is not relieved with medicine. Document Released: 05/18/2004 Document Revised: 07/25/2013 Document Reviewed: 06/11/2013 [facility] Patient Information 2015 [place], [place]. This information is not intended to replace advice given to you by your health care provider. Make sure you discuss any questions you have with your health care provider.  High-Fiber Diet Fiber is found in fruits, vegetables, and grains. A high-fiber diet encourages the addition of more whole grains, legumes, fruits, and vegetables in your diet. The recommended amount of fiber for adult males is 38 g per day. For adult females, it is 25 g per day. Pregnant and lactating women should get 28 g of fiber per day. If you have a digestive or bowel problem, ask your caregiver for advice before adding high-fiber foods to your diet. Eat a variety of high-fiber foods instead of only a select few type of foods.  PURPOSE  To increase stool bulk.  To make bowel movements more regular to prevent constipation.  To lower cholesterol.  To prevent overeating. WHEN IS THIS DIET USED?  It may be used if you have constipation and hemorrhoids.  It may be used if you have uncomplicated diverticulosis (intestine condition) and irritable bowel syndrome.  It may be used if you need help with weight management.  It may be used if you want to  add it to your diet as a protective measure against atherosclerosis, diabetes, and cancer. °SOURCES OF FIBER °· Whole-grain breads and cereals. °· Fruits, such as apples, oranges, bananas, berries, prunes, and pears. °· Vegetables, such as green peas, carrots, sweet potatoes, beets, broccoli, cabbage, spinach, and artichokes. °· Legumes, such split peas, soy, lentils. °· Almonds. °FIBER CONTENT IN  FOODS °Starches and Grains / Dietary Fiber (g) °· Cheerios, 1 cup / 3 g °· Corn Flakes cereal, 1 cup / 0.7 g °· Rice crispy treat cereal, 1¼ cup / 0.3 g °· Instant oatmeal (cooked), ½ cup / 2 g °· Frosted wheat cereal, 1 cup / 5.1 g °· Brown, long-grain rice (cooked), 1 cup / 3.5 g °· White, long-grain rice (cooked), 1 cup / 0.6 g °· Enriched macaroni (cooked), 1 cup / 2.5 g °Legumes / Dietary Fiber (g) °· Baked beans (canned, plain, or vegetarian), ½ cup / 5.2 g °· Kidney beans (canned), ½ cup / 6.8 g °· Pinto beans (cooked), ½ cup / 5.5 g °Breads and Crackers / Dietary Fiber (g) °· Plain or honey graham crackers, 2 squares / 0.7 g °· Saltine crackers, 3 squares / 0.3 g °· Plain, salted pretzels, 10 pieces / 1.8 g °· Whole-wheat bread, 1 slice / 1.9 g °· White bread, 1 slice / 0.7 g °· Raisin bread, 1 slice / 1.2 g °· Plain bagel, 3 oz / 2 g °· Flour tortilla, 1 oz / 0.9 g °· Corn tortilla, 1 small / 1.5 g °· Hamburger or hotdog bun, 1 small / 0.9 g °Fruits / Dietary Fiber (g) °· Apple with skin, 1 medium / 4.4 g °· Sweetened applesauce, ½ cup / 1.5 g °· Banana, ½ medium / 1.5 g °· Grapes, 10 grapes / 0.4 g °· Orange, 1 small / 2.3 g °· Raisin, 1.5 oz / 1.6 g °· Melon, 1 cup / 1.4 g °Vegetables / Dietary Fiber (g) °· Green beans (canned), ½ cup / 1.3 g °· Carrots (cooked), ½ cup / 2.3 g °· Broccoli (cooked), ½ cup / 2.8 g °· Peas (cooked), ½ cup / 4.4 g °· Mashed potatoes, ½ cup / 1.6 g °· Lettuce, 1 cup / 0.5 g °· Corn (canned), ½ cup / 1.6 g °· Tomato, ½ cup / 1.1 g °Document Released: 10/04/2005 Document Revised: 04/04/2012 Document Reviewed: 01/06/2012 °[employer]® Patient Information ©2015 [employer]. This information is not intended to replace advice given to you by your health care provider. Make sure you discuss any questions you have with your health care provider. ° °

## 2014-08-22 NOTE — H&P (Signed)
Franklin Jones is an 63 y.o. male.  Chief Complaint: patient is here for colonoscopy. HPI:  Patient is 63 year old Caucasian male who is here for screening colonoscopy.his last exam was over 10 years ago. He denies abdominal pain change in bowel habits or rectal bleeding. Family history is negative for CRC.  Past Medical History  Diagnosis Date  . Allergic rhinitis   . History of asbestos exposure     followed by Dr Franklin Jones- states stable years- last CT chest 7/12 in Epic  . Arthritis   . Degenerative arthritis of knee 11/19/2011  . Shortness of breath     SOME SOB BUT CAN CLIMB FLIGHT OF STAIRS  . GERD (gastroesophageal reflux disease)   . Hypertension     Past Surgical History  Procedure Laterality Date  . Left finger amputation  2009  . Fracture surgery      traumatic injury left small finger with amputation  . Knee arthroplasty  11/19/2011    Procedure: COMPUTER ASSISTED TOTAL KNEE ARTHROPLASTY;  Surgeon: Franklin Rossetti, MD;  Location: [facility];  Service: Orthopedics;  Laterality: Right;  Right Total Knee Arthroplasty  . Total knee arthroplasty  08/18/2012    Procedure: TOTAL KNEE ARTHROPLASTY;  Surgeon: Franklin Rossetti, MD;  Location: [facility];  Service: Orthopedics;  Laterality: Left;  Left Total Knee Arthroplasty  . Colonoscopy    . Mass excision Left 03/12/2014    Procedure: EXCISION LEFT NECK SKIN MASS;  Surgeon: Franklin Curry, MD;  Location: [place];  Service: General;  Laterality: Left;    Family History  Problem Relation Age of Onset  . Allergies Mother   . Heart disease Father   . Lung cancer Brother    Social History:  reports that he quit smoking about 28 years ago. His smoking use included Cigarettes. He has a 20 pack-year smoking history. He has never used smokeless tobacco. He reports that he drinks about 5.4 oz of alcohol per week. He reports that he does not use illicit drugs.  Allergies:  Allergies  Allergen Reactions  . Penicillins      REACTION: rash    Medications Prior to Admission  Medication Sig Dispense Refill  . loratadine (CLARITIN) 10 MG tablet Take 10 mg by mouth daily.    . peg 3350 powder (MOVIPREP) 100 G SOLR Take 1 kit (200 g total) by mouth once. 1 kit 0  . albuterol-ipratropium (COMBIVENT) 18-103 MCG/ACT inhaler Inhale 2 puffs into the lungs every 6 (six) hours as needed for wheezing or shortness of breath.    [name] Kitchen omeprazole (PRILOSEC) 40 MG capsule Take 40 mg by mouth daily.    . sildenafil (VIAGRA) 100 MG tablet Take 100 mg by mouth daily as needed for erectile dysfunction.      No results found for this or any previous visit (from the past 48 hour(s)). No results found.  ROS  Blood pressure 140/97, pulse 90, temperature 97.9 F (36.6 C), temperature source Oral, resp. rate 15, height _0  (1.88 m), weight 250 lb (113.399 kg), SpO2 95 %. Physical Exam  Constitutional: He appears well-developed and well-nourished.  HENT:  Mouth/Throat: Oropharynx is clear and moist.  Eyes: Conjunctivae are normal. No scleral icterus.  Neck: No thyromegaly present.  Cardiovascular: Normal rate, regular rhythm and normal heart sounds.   No murmur heard. GI: Soft. He exhibits no distension and no mass. There is no tenderness.  Musculoskeletal: He exhibits no edema.  Lymphadenopathy:    He  has no cervical adenopathy.  Neurological: He is alert.  Skin: Skin is warm and dry.     Assessment/Plan; Average risk screening colonoscopy.  Franklin Jones U 08/22/2014, 8:44 AM

## 2014-08-26 ENCOUNTER — Encounter (HOSPITAL_COMMUNITY): Payer: Self-pay | Admitting: Internal Medicine

## 2014-09-05 ENCOUNTER — Encounter (INDEPENDENT_AMBULATORY_CARE_PROVIDER_SITE_OTHER): Payer: Self-pay | Admitting: *Deleted

## 2015-05-21 ENCOUNTER — Telehealth: Payer: Self-pay | Admitting: Emergency Medicine

## 2015-05-21 DIAGNOSIS — Z77090 Contact with and (suspected) exposure to asbestos: Secondary | ICD-10-CM

## 2015-05-21 NOTE — Telephone Encounter (Signed)
ATC pt. Line rang without VM. WCB.  HRCT order placed per last OV with RB.

## 2015-05-22 NOTE — Telephone Encounter (Signed)
According to EPIC CT is scheduled for 05/29/15. Called spoke with pt and he was called yesterday with this appt.  Nothing further needed

## 2015-05-28 ENCOUNTER — Other Ambulatory Visit: Payer: Self-pay | Admitting: Emergency Medicine

## 2015-05-28 DIAGNOSIS — R0600 Dyspnea, unspecified: Secondary | ICD-10-CM

## 2015-05-29 ENCOUNTER — Ambulatory Visit (INDEPENDENT_AMBULATORY_CARE_PROVIDER_SITE_OTHER): Payer: Worker's Compensation | Admitting: Emergency Medicine

## 2015-05-29 ENCOUNTER — Ambulatory Visit (INDEPENDENT_AMBULATORY_CARE_PROVIDER_SITE_OTHER)
Admission: RE | Admit: 2015-05-29 | Discharge: 2015-05-29 | Disposition: A | Discharge status: Home / Self Care | Payer: Worker's Compensation | Source: Ambulatory Visit | Attending: Emergency Medicine | Admitting: Emergency Medicine

## 2015-05-29 ENCOUNTER — Encounter: Payer: Self-pay | Admitting: Emergency Medicine

## 2015-05-29 VITALS — BP 140/86 | HR 82 | Ht 73.5 in | Wt 253.0 lb

## 2015-05-29 DIAGNOSIS — Z77090 Contact with and (suspected) exposure to asbestos: Secondary | ICD-10-CM

## 2015-05-29 DIAGNOSIS — R0600 Dyspnea, unspecified: Secondary | ICD-10-CM | POA: Diagnosis not present

## 2015-05-29 LAB — PULMONARY FUNCTION TEST
DL/VA % pred: 63 %
DL/VA: 3.07 ml/min/mmHg/L
DLCO UNC % PRED: 59 %
DLCO UNC: 22.2 ml/min/mmHg
FEF 25-75 Post: 3.27 L/sec
FEF 25-75 Pre: 2.56 L/sec
FEF2575-%Change-Post: 27 %
FEF2575-%PRED-POST: 106 %
FEF2575-%Pred-Pre: 83 %
FEV1-%CHANGE-POST: 5 %
FEV1-%PRED-PRE: 88 %
FEV1-%Pred-Post: 92 %
FEV1-PRE: 3.44 L
FEV1-Post: 3.63 L
FEV1FVC-%CHANGE-POST: 4 %
FEV1FVC-%PRED-PRE: 99 %
FEV6-%CHANGE-POST: 2 %
FEV6-%PRED-POST: 93 %
FEV6-%Pred-Pre: 91 %
FEV6-Post: 4.64 L
FEV6-Pre: 4.54 L
FEV6FVC-%Change-Post: 0 %
FEV6FVC-%Pred-Post: 104 %
FEV6FVC-%Pred-Pre: 103 %
FVC-%Change-Post: 1 %
FVC-%PRED-POST: 89 %
FVC-%Pred-Pre: 88 %
FVC-Post: 4.67 L
FVC-Pre: 4.62 L
Post FEV1/FVC ratio: 78 %
Post FEV6/FVC ratio: 99 %
Pre FEV1/FVC ratio: 74 %
Pre FEV6/FVC Ratio: 98 %
RV % PRED: 111 %
RV: 2.83 L
TLC % pred: 99 %
TLC: 7.71 L

## 2015-05-29 NOTE — Progress Notes (Signed)
PFT done today. 

## 2015-05-29 NOTE — Progress Notes (Signed)
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 Was discovered to have an abnormal CXR, around 2004. No other symptoms at that time. Over time he has developed some exertional dyspnea. Has never had a CT scan of the chest. He has done spirometry and CXR's   ROV 04/09/10 -- follows up to today to review Ct scan and PFT in setting of asbestos exposure. Other ROS - mentions sinus pain/pressure, nasal congestion, clear drainage.   ROV 04/28/11 -- follow up visit, hx asbestos exposure. Had PFT and CT scan today. 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 PFT as below. He has noticed a slight decrease in functional capacity - related this to knee pain and decreased activity level, decreased conditioning.   ROV 05/04/12 -- follow up visit, hx asbestos exposure. Had PFT and CT scan today. 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 Returns feeling well. PFT today are stable - FEV1 3.24L. No new symptoms.   ROV 06/06/13 -- 64 yo man, hx asbestos exposure. Underwent CT scan chest and PFT today. Both are stable. He does have some mild to mod AFL with probable BD response. He c/o GERD sx today. He used to take aleve frequently, stopped 3 weeks ago.   ROV 06/10/14 -- pleasant 64 yo man, follows for mild asthma and asbestos exposure. He also has allergies / rhinitis. PFT done today 8/24 > stable compared with 06/06/13. He is feeling well. Never needed the SABA. He has retired - feels he needs more exercise. He has nasal drainage, restarted decongestants. CT scan shows stable tiny nodules compared with last year. Needs to go to [place] [facility] [place] in [place].   ROV 05/29/15 -- follows up for hx of asbestos exposure, allergic rhinitis, mild asthma. He underwent PFT today that I have personally reviewed. Shows normal airflows. The FEV1 is slightly improved to 3.44L compared with 3.1L in 2014. He underwent CT scan of the chest today. I have personally reviewed this and  compared with his priors including his most recent that was on 06/10/14. There has not been any significant change. He tells me that he has a slight drop in his exertional tolerance, feels that the has lost some of his conditioning, hasn't done as much exercise since he retired. No new sx. He dose not use SABA  Filed Vitals:   05/29/15 1322 05/29/15 1324  BP:  140/86  Pulse:  82  Height: 6' 1.5" (1.867 m)   Weight: 253 lb (114.76 kg)   SpO2:  93%   Gen: Pleasant, well-nourished, in no distress,  normal affect  ENT: No lesions,  mouth clear,  oropharynx clear, no postnasal drip  Neck: No JVD, no TMG, no carotid bruits  Lungs: No use of accessory muscles, no dullness to percussion, clear without rales or rhonchi  Cardiovascular: RRR, heart sounds normal, no murmur or gallops, no peripheral edema  Musculoskeletal: No deformities, no cyanosis or clubbing  Neuro: alert, non focal  Skin: Warm, no lesions or rashes  PULMONARY FUNCTON TEST 04/09/2010 04/28/2011 05/04/2012 06/06/2013  FVC 4.98 4.51 4.42 4.27  FEV1 3.68 3.2 3.24 3.1  FEV1/FVC 73.9 71 73.3 72.6  FVC  % Predicted 92 87 85 80  FEV % Predicted 90 88 90 78  FeF 25-75 2.66 2.01 2.32 4.09  FeF 25-75 % Predicted 3.32 3.29 3.25 5.04        ASSESSMENT/PLAN:  ASBESTOS EXPOSURE, HX OF History of asbestos exposure through his occupation. His pulmonary  function testing today is actually improved compared with his priors. 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 I do not believe he's had any disease progression based on the studies. I do want him to work on his exercise and his physical conditioning. Next year I will see him with full PFT but will defer the CAT scan. He may not need to be imaged every year if he is not clinically changing and if his physiology is stable.

## 2015-05-29 NOTE — Assessment & Plan Note (Signed)
History of asbestos exposure through his occupation. His pulmonary function testing today is actually improved compared with his priors. 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 I do not believe he's had any disease progression based on the studies. I do want him to work on his exercise and his physical conditioning. Next year I will see him with full PFT but will defer the CAT scan. He may not need to be imaged every year if he is not clinically changing and if his physiology is stable.

## 2015-05-29 NOTE — Patient Instructions (Addendum)
We will follow up in 1 year with full pulmonary function testing  We will defer a repeat CT scan until after we meet.

## 2015-11-28 DIAGNOSIS — K219 Gastro-esophageal reflux disease without esophagitis: Secondary | ICD-10-CM | POA: Diagnosis not present

## 2015-11-28 DIAGNOSIS — Z418 Encounter for other procedures for purposes other than remedying health state: Secondary | ICD-10-CM | POA: Diagnosis not present

## 2015-11-28 DIAGNOSIS — Z87891 Personal history of nicotine dependence: Secondary | ICD-10-CM | POA: Diagnosis not present

## 2015-11-28 DIAGNOSIS — I10 Essential (primary) hypertension: Secondary | ICD-10-CM | POA: Diagnosis not present

## 2016-02-13 DIAGNOSIS — I780 Hereditary hemorrhagic telangiectasia: Secondary | ICD-10-CM | POA: Diagnosis not present

## 2016-02-13 DIAGNOSIS — L570 Actinic keratosis: Secondary | ICD-10-CM | POA: Diagnosis not present

## 2016-03-01 DIAGNOSIS — I10 Essential (primary) hypertension: Secondary | ICD-10-CM | POA: Diagnosis not present

## 2016-03-01 DIAGNOSIS — Z299 Encounter for prophylactic measures, unspecified: Secondary | ICD-10-CM | POA: Diagnosis not present

## 2016-06-03 DIAGNOSIS — Z87891 Personal history of nicotine dependence: Secondary | ICD-10-CM | POA: Diagnosis not present

## 2016-06-03 DIAGNOSIS — J069 Acute upper respiratory infection, unspecified: Secondary | ICD-10-CM | POA: Diagnosis not present

## 2016-06-09 ENCOUNTER — Ambulatory Visit: Payer: Self-pay | Admitting: Emergency Medicine

## 2016-07-05 DIAGNOSIS — Z0000 Encounter for general adult medical examination without abnormal findings: Secondary | ICD-10-CM | POA: Diagnosis not present

## 2016-07-05 DIAGNOSIS — R5383 Other fatigue: Secondary | ICD-10-CM | POA: Diagnosis not present

## 2016-07-05 DIAGNOSIS — Z1211 Encounter for screening for malignant neoplasm of colon: Secondary | ICD-10-CM | POA: Diagnosis not present

## 2016-07-05 DIAGNOSIS — Z299 Encounter for prophylactic measures, unspecified: Secondary | ICD-10-CM | POA: Diagnosis not present

## 2016-07-05 DIAGNOSIS — Z1389 Encounter for screening for other disorder: Secondary | ICD-10-CM | POA: Diagnosis not present

## 2016-07-05 DIAGNOSIS — Z79899 Other long term (current) drug therapy: Secondary | ICD-10-CM | POA: Diagnosis not present

## 2016-07-05 DIAGNOSIS — Z125 Encounter for screening for malignant neoplasm of prostate: Secondary | ICD-10-CM | POA: Diagnosis not present

## 2016-07-05 DIAGNOSIS — Z7189 Other specified counseling: Secondary | ICD-10-CM | POA: Diagnosis not present

## 2016-07-05 DIAGNOSIS — E7800 Pure hypercholesterolemia, unspecified: Secondary | ICD-10-CM | POA: Diagnosis not present

## 2016-07-29 ENCOUNTER — Other Ambulatory Visit: Payer: Self-pay | Admitting: Emergency Medicine

## 2016-07-29 DIAGNOSIS — R0600 Dyspnea, unspecified: Secondary | ICD-10-CM

## 2016-07-30 ENCOUNTER — Ambulatory Visit (INDEPENDENT_AMBULATORY_CARE_PROVIDER_SITE_OTHER): Payer: Worker's Compensation | Admitting: Emergency Medicine

## 2016-07-30 ENCOUNTER — Encounter: Payer: Self-pay | Admitting: Emergency Medicine

## 2016-07-30 DIAGNOSIS — Z77090 Contact with and (suspected) exposure to asbestos: Secondary | ICD-10-CM | POA: Diagnosis not present

## 2016-07-30 DIAGNOSIS — J4520 Mild intermittent asthma, uncomplicated: Secondary | ICD-10-CM | POA: Diagnosis not present

## 2016-07-30 DIAGNOSIS — K219 Gastro-esophageal reflux disease without esophagitis: Secondary | ICD-10-CM

## 2016-07-30 DIAGNOSIS — R0600 Dyspnea, unspecified: Secondary | ICD-10-CM

## 2016-07-30 DIAGNOSIS — J45909 Unspecified asthma, uncomplicated: Secondary | ICD-10-CM | POA: Insufficient documentation

## 2016-07-30 LAB — PULMONARY FUNCTION TEST
DL/VA % pred: 61 %
DL/VA: 2.96 ml/min/mmHg/L
DLCO UNC: 20.03 ml/min/mmHg
DLCO cor % pred: 53 %
DLCO cor: 19.98 ml/min/mmHg
DLCO unc % pred: 54 %
FEF 25-75 Post: 2.14 L/sec
FEF 25-75 Pre: 1.97 L/sec
FEF2575-%Change-Post: 8 %
FEF2575-%Pred-Post: 71 %
FEF2575-%Pred-Pre: 65 %
FEV1-%CHANGE-POST: 1 %
FEV1-%PRED-POST: 74 %
FEV1-%Pred-Pre: 73 %
FEV1-PRE: 2.82 L
FEV1-Post: 2.87 L
FEV1FVC-%Change-Post: 0 %
FEV1FVC-%Pred-Pre: 99 %
FEV6-%Change-Post: 0 %
FEV6-%PRED-POST: 77 %
FEV6-%Pred-Pre: 77 %
FEV6-POST: 3.81 L
FEV6-Pre: 3.8 L
FEV6FVC-%CHANGE-POST: 0 %
FEV6FVC-%PRED-POST: 103 %
FEV6FVC-%Pred-Pre: 104 %
FVC-%CHANGE-POST: 1 %
FVC-%PRED-POST: 75 %
FVC-%PRED-PRE: 74 %
FVC-POST: 3.89 L
FVC-Pre: 3.85 L
Post FEV1/FVC ratio: 74 %
Post FEV6/FVC ratio: 98 %
Pre FEV1/FVC ratio: 73 %
Pre FEV6/FVC Ratio: 99 %
RV % PRED: 121 %
RV: 3.12 L
TLC % pred: 92 %
TLC: 7.16 L

## 2016-07-30 MED ORDER — IPRATROPIUM-ALBUTEROL 18-103 MCG/ACT IN AERO: 2.0000 | INHALATION_SPRAY | Freq: Four times a day (QID) | RESPIRATORY_TRACT | 11 refills | Status: DC | PRN

## 2016-07-30 NOTE — Patient Instructions (Signed)
Please keep combivent available to use as needed for shortness of breath Discuss possible increase in your omeprazole with your primary physician  We will plan to repeat your PFT in 9 months, follow up in August 2018 to review. Based on this test we will decide whether we need to evaluate further and the timing of your next CT scan

## 2016-07-30 NOTE — Assessment & Plan Note (Signed)
Discussed with him today. He's having significant breakthrough symptoms. He may need to increase his omeprazole to 40 mg twice a day. I asked him to discuss this with Dr. Woody Seller

## 2016-07-30 NOTE — Progress Notes (Signed)
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 Was discovered to have an abnormal CXR, around 2004. No other symptoms at that time. Over time he has developed some exertional dyspnea. Has never had a CT scan of the chest. He has done spirometry and CXR's   ROV 04/09/10 -- follows up to today to review Ct scan and PFT in setting of asbestos exposure. Other ROS - mentions sinus pain/pressure, nasal congestion, clear drainage.   ROV 04/28/11 -- follow up visit, hx asbestos exposure. Had PFT and CT scan today. 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 PFT as below. He has noticed a slight decrease in functional capacity - related this to knee pain and decreased activity level, decreased conditioning.   ROV 05/04/12 -- follow up visit, hx asbestos exposure. Had PFT and CT scan today. 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 Returns feeling well. PFT today are stable - FEV1 3.24L. No new symptoms.   ROV 06/06/13 -- 65 yo man, hx asbestos exposure. Underwent CT scan chest and PFT today. Both are stable. He does have some mild to mod AFL with probable BD response. He c/o GERD sx today. He used to take aleve frequently, stopped 3 weeks ago.   ROV 06/10/14 -- pleasant 65 yo man, follows for mild asthma and asbestos exposure. He also has allergies / rhinitis. PFT done today 8/24 > stable compared with 06/06/13. He is feeling well. Never needed the SABA. He has retired - feels he needs more exercise. He has nasal drainage, restarted decongestants. CT scan shows stable tiny nodules compared with last year. Needs to go to [facility] in [place].   ROV 05/29/15 -- follows up for hx of asbestos exposure, allergic rhinitis, mild asthma. He underwent PFT today that I have personally reviewed. Shows normal airflows. The FEV1 is slightly improved to 3.44L compared with 3.1L in 2014. He underwent CT scan of the chest today. I have personally reviewed this and  compared with his priors including his most recent that was on 06/10/14. There has not been any significant change. He tells me that he has a slight drop in his exertional tolerance, feels that the has lost some of his conditioning, hasn't done as much exercise since he retired. No new sx. He dose not use SABA  ROV 07/30/16 -- patient follows up today for his history of asbestos exposure, allergic rhinitis, asthma. He underwent repeat pulmonary function testing today that I have personally reviewed. This shows an FEV1 of 2.82 L or 73% predicted, FVC 3.85 L or 74% predicted, and a ratio of 73%. The FEV1 is decreased compared with 05/29/15 when his FEV1 was 3.44L (88%). He has gained 5 lbs since last time.  He describes no real change in his breathing. He is bothered by GERD when he exerts. He has combivent to use as needed, never uses it. He does have some occasional cough, ocasional wheeze. [name] Kitchen His does not exercise as much as he used to do.    Vitals:   07/30/16 1328 07/30/16 1332  BP:  126/84  BP Location:  Left Arm  Cuff Size:  Normal  Pulse:  77  SpO2:  95%  Weight: 258 lb (117 kg)   Height: 6' 1.5" (1.867 m)    Gen: Pleasant, well-nourished, in no distress,  normal affect  ENT: No lesions,  mouth clear,  oropharynx clear, no postnasal drip  Neck: No JVD, no TMG, no carotid bruits  Lungs: No use of accessory muscles, no dullness to percussion, clear without rales or rhonchi  Cardiovascular: RRR, heart sounds normal, no murmur or gallops, no peripheral edema  Musculoskeletal: No deformities, no cyanosis or clubbing  Neuro: alert, non focal  Skin: Warm, no lesions or rashes  PULMONARY FUNCTON TEST 04/09/2010 04/28/2011 05/04/2012 06/06/2013  FVC 4.98 4.51 4.42 4.27  FEV1 3.68 3.2 3.24 3.1  FEV1/FVC 73.9 71 73.3 72.6  FVC  % Predicted 92 87 85 80  FEV % Predicted 90 88 90 78  FeF 25-75 2.66 2.01 2.32 4.09  FeF 25-75 % Predicted 3.32 3.29 3.25 5.04        ASSESSMENT/PLAN:   Asbestos exposure With very mild fibrotic changes noted on CT scan. Last done 2016. Given his change in pulmonary function testing I believe we will likely need a repeat CT scan within the next year. Have repeat PFT in 9 months. Depending on those results we will decide about the timing of any further workup as well as his repeat CT scan.  Asthma Has not been on scheduled bronchodilators. He uses Combivent as needed. Worsening of his FEV1 and his ratio on pulmonary function testing today. His PFT in 9 months. Depending on the results and depending on a repeat CT scan we may need to schedule an everyday bronchodilator.  GERD (gastroesophageal reflux disease) Discussed with him today. He's having significant breakthrough symptoms. He may need to increase his omeprazole to 40 mg twice a day. I asked him to discuss this with Dr. Birder Robson, MD, PhD 07/30/2016, 1:52 PM [facility] [phone] or if no answer [phone]

## 2016-07-30 NOTE — Assessment & Plan Note (Signed)
With very mild fibrotic changes noted on CT scan. Last done 2016. Given his change in pulmonary function testing I believe we will likely need a repeat CT scan within the next year. Have repeat PFT in 9 months. Depending on those results we will decide about the timing of any further workup as well as his repeat CT scan.

## 2016-07-30 NOTE — Assessment & Plan Note (Signed)
Has not been on scheduled bronchodilators. He uses Combivent as needed. Worsening of his FEV1 and his ratio on pulmonary function testing today. His PFT in 9 months. Depending on the results and depending on a repeat CT scan we may need to schedule an everyday bronchodilator.

## 2016-08-13 DIAGNOSIS — D235 Other benign neoplasm of skin of trunk: Secondary | ICD-10-CM | POA: Diagnosis not present

## 2016-08-13 DIAGNOSIS — L821 Other seborrheic keratosis: Secondary | ICD-10-CM | POA: Diagnosis not present

## 2016-08-13 DIAGNOSIS — L570 Actinic keratosis: Secondary | ICD-10-CM | POA: Diagnosis not present

## 2016-08-13 DIAGNOSIS — D1801 Hemangioma of skin and subcutaneous tissue: Secondary | ICD-10-CM | POA: Diagnosis not present

## 2016-08-13 DIAGNOSIS — L814 Other melanin hyperpigmentation: Secondary | ICD-10-CM | POA: Diagnosis not present

## 2016-08-13 DIAGNOSIS — L578 Other skin changes due to chronic exposure to nonionizing radiation: Secondary | ICD-10-CM | POA: Diagnosis not present

## 2017-02-02 DIAGNOSIS — Z87891 Personal history of nicotine dependence: Secondary | ICD-10-CM | POA: Diagnosis not present

## 2017-02-02 DIAGNOSIS — E7800 Pure hypercholesterolemia, unspecified: Secondary | ICD-10-CM | POA: Diagnosis not present

## 2017-02-02 DIAGNOSIS — I10 Essential (primary) hypertension: Secondary | ICD-10-CM | POA: Diagnosis not present

## 2017-02-02 DIAGNOSIS — Z299 Encounter for prophylactic measures, unspecified: Secondary | ICD-10-CM | POA: Diagnosis not present

## 2017-02-02 DIAGNOSIS — K219 Gastro-esophageal reflux disease without esophagitis: Secondary | ICD-10-CM | POA: Diagnosis not present

## 2017-05-17 ENCOUNTER — Encounter (INDEPENDENT_AMBULATORY_CARE_PROVIDER_SITE_OTHER): Payer: Self-pay | Admitting: Internal Medicine

## 2017-05-17 ENCOUNTER — Encounter (INDEPENDENT_AMBULATORY_CARE_PROVIDER_SITE_OTHER): Payer: Self-pay

## 2017-05-17 ENCOUNTER — Ambulatory Visit (INDEPENDENT_AMBULATORY_CARE_PROVIDER_SITE_OTHER): Payer: Medicare Other | Admitting: Internal Medicine

## 2017-05-17 VITALS — BP 110/78 | HR 72 | Temp 97.6°F | Ht 72.0 in | Wt 250.3 lb

## 2017-05-17 DIAGNOSIS — R1010 Upper abdominal pain, unspecified: Secondary | ICD-10-CM | POA: Diagnosis not present

## 2017-05-17 DIAGNOSIS — K219 Gastro-esophageal reflux disease without esophagitis: Secondary | ICD-10-CM | POA: Diagnosis not present

## 2017-05-17 MED ORDER — PANTOPRAZOLE SODIUM 40 MG PO TBEC: 40.0000 mg | DELAYED_RELEASE_TABLET | Freq: Every day | ORAL | 3 refills | Status: DC

## 2017-05-17 NOTE — Patient Instructions (Signed)
Rx for Protonix. US abdomen

## 2017-05-17 NOTE — Progress Notes (Signed)
Subjective:    Patient ID: Franklin Jones, male    DOB: 09-19-1951, 66 y.o.   MRN: [id number]  HPI  Here today with c/o gas and bloating. He has a lot of bloating when he exerts himself. He states he get SOB and nauseated.  He has burping and belching. Afterwards, he feels better. He says his stomach is more sensitive to indigestion. About once a week, he will bloat after eating. He says it has progressively worsened.  Appetite is good.  Usually hasa BM daily.   10/22/2013 Colonoscopy: average risk: Examination performed to cecum. Mild sigmoid colon diverticulosis. 3 mm polyp ablated via cold biopsy from descending colon.   Patient had 3 mm polyp removed and is tubular adenoma Next colonoscopy in 10 years   Review of Systems   Past Medical History:  Diagnosis Date  . Allergic rhinitis   . Arthritis   . Degenerative arthritis of knee 11/19/2011  . GERD (gastroesophageal reflux disease)   . History of asbestos exposure    followed by Dr West Carbo- states stable years- last CT chest 7/12 in Epic  . Hypertension   . Shortness of breath    SOME SOB BUT CAN CLIMB FLIGHT OF STAIRS    Past Surgical History:  Procedure Laterality Date  . COLONOSCOPY    . COLONOSCOPY N/A 08/22/2014   Procedure: COLONOSCOPY;  Surgeon: Rogene Houston, MD;  [facility];  Service: Endoscopy;  Laterality: N/A;  830  . 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 KNEE ARTHROPLASTY  11/19/2011   Procedure: COMPUTER ASSISTED TOTAL KNEE ARTHROPLASTY;  Surgeon: Mcarthur Rossetti, MD;  Location: [facility];  Service: Orthopedics;  Laterality: Right;  Right Total Knee Arthroplasty  . left finger amputation  2009  . MASS EXCISION Left 03/12/2014   Procedure: EXCISION LEFT NECK SKIN MASS;  Surgeon: Gayland Curry, MD;  Location: [facility];  Service: General;  Laterality: Left;  . TOTAL KNEE ARTHROPLASTY  08/18/2012   Procedure: TOTAL KNEE ARTHROPLASTY;  Surgeon:  Mcarthur Rossetti, MD;  Location: [facility];  Service: Orthopedics;  Laterality: Left;  Left Total Knee Arthroplasty    Allergies  Allergen Reactions  . Penicillins     REACTION: rash    Current Outpatient Prescriptions on File Prior to Visit  Medication Sig Dispense Refill  . albuterol-ipratropium (COMBIVENT) 18-103 MCG/ACT inhaler Inhale 2 puffs into the lungs every 6 (six) hours as needed for wheezing or shortness of breath. 1 Inhaler 11  . omeprazole (PRILOSEC) 40 MG capsule Take 40 mg by mouth daily.    . sildenafil (VIAGRA) 100 MG tablet Take 180 mg by mouth daily as needed for erectile dysfunction.      No current facility-administered medications on file prior to visit.         Objective:   Physical Exam Blood pressure 110/78, pulse 72, temperature 97.6 F (36.4 C), height 6' (1.829 m), weight 250 lb 4.8 oz (113.5 kg). Alert and oriented. Skin warm and dry. Oral mucosa is moist.   . Sclera anicteric, conjunctivae is pink. Thyroid not enlarged. No cervical lymphadenopathy. Lungs clear. Heart regular rate and rhythm.  Abdomen is soft. Bowel sounds are positive. No hepatomegaly. No abdominal masses felt. No tenderness.  No edema to lower extremities.           Assessment & Plan:  GERD which seems to have worsened. Am going to switch him to [employer]  40mg  daily.  Will schedule an US abdomen.

## 2017-05-23 ENCOUNTER — Ambulatory Visit (HOSPITAL_COMMUNITY)
Admission: RE | Admit: 2017-05-23 | Discharge: 2017-05-23 | Disposition: A | Discharge status: Home / Self Care | Payer: Medicare Other | Source: Ambulatory Visit | Attending: Internal Medicine | Admitting: Internal Medicine

## 2017-05-23 ENCOUNTER — Encounter (INDEPENDENT_AMBULATORY_CARE_PROVIDER_SITE_OTHER): Payer: Self-pay

## 2017-05-23 DIAGNOSIS — K760 Fatty (change of) liver, not elsewhere classified: Secondary | ICD-10-CM | POA: Insufficient documentation

## 2017-05-23 DIAGNOSIS — R1010 Upper abdominal pain, unspecified: Secondary | ICD-10-CM | POA: Diagnosis not present

## 2017-06-08 DIAGNOSIS — E7800 Pure hypercholesterolemia, unspecified: Secondary | ICD-10-CM | POA: Diagnosis not present

## 2017-06-08 DIAGNOSIS — J069 Acute upper respiratory infection, unspecified: Secondary | ICD-10-CM | POA: Diagnosis not present

## 2017-06-08 DIAGNOSIS — Z299 Encounter for prophylactic measures, unspecified: Secondary | ICD-10-CM | POA: Diagnosis not present

## 2017-06-08 DIAGNOSIS — I10 Essential (primary) hypertension: Secondary | ICD-10-CM | POA: Diagnosis not present

## 2017-06-08 DIAGNOSIS — Z6831 Body mass index (BMI) 31.0-31.9, adult: Secondary | ICD-10-CM | POA: Diagnosis not present

## 2017-06-08 DIAGNOSIS — Z87891 Personal history of nicotine dependence: Secondary | ICD-10-CM | POA: Diagnosis not present

## 2017-06-10 ENCOUNTER — Telehealth: Payer: Self-pay | Admitting: Emergency Medicine

## 2017-06-10 NOTE — Telephone Encounter (Signed)
Called and spoke with pt and he is aware to keep the appts at this time.  He will monitor himself over the weekend and see how is doing on Monday.

## 2017-06-13 ENCOUNTER — Other Ambulatory Visit: Payer: Self-pay | Admitting: Emergency Medicine

## 2017-06-13 DIAGNOSIS — R0600 Dyspnea, unspecified: Secondary | ICD-10-CM

## 2017-06-14 ENCOUNTER — Ambulatory Visit (INDEPENDENT_AMBULATORY_CARE_PROVIDER_SITE_OTHER): Payer: Worker's Compensation | Admitting: Emergency Medicine

## 2017-06-14 ENCOUNTER — Encounter: Payer: Self-pay | Admitting: Emergency Medicine

## 2017-06-14 DIAGNOSIS — R0600 Dyspnea, unspecified: Secondary | ICD-10-CM

## 2017-06-14 DIAGNOSIS — J849 Interstitial pulmonary disease, unspecified: Secondary | ICD-10-CM

## 2017-06-14 DIAGNOSIS — J4520 Mild intermittent asthma, uncomplicated: Secondary | ICD-10-CM

## 2017-06-14 LAB — PULMONARY FUNCTION TEST
DL/VA % pred: 54 %
DL/VA: 2.64 ml/min/mmHg/L
DLCO COR: 17.97 ml/min/mmHg
DLCO cor % pred: 48 %
DLCO unc % pred: 50 %
DLCO unc: 18.55 ml/min/mmHg
FEF 25-75 Post: 1.87 L/sec
FEF 25-75 Pre: 1.48 L/sec
FEF2575-%CHANGE-POST: 26 %
FEF2575-%PRED-POST: 63 %
FEF2575-%PRED-PRE: 50 %
FEV1-%Change-Post: 5 %
FEV1-%PRED-POST: 77 %
FEV1-%Pred-Pre: 73 %
FEV1-Post: 2.98 L
FEV1-Pre: 2.83 L
FEV1FVC-%CHANGE-POST: 7 %
FEV1FVC-%Pred-Pre: 91 %
FEV6-%Change-Post: 1 %
FEV6-%Pred-Post: 81 %
FEV6-%Pred-Pre: 80 %
FEV6-PRE: 3.92 L
FEV6-Post: 3.98 L
FEV6FVC-%Change-Post: 3 %
FEV6FVC-%PRED-PRE: 99 %
FEV6FVC-%Pred-Post: 102 %
FVC-%Change-Post: -1 %
FVC-%Pred-Post: 79 %
FVC-%Pred-Pre: 80 %
FVC-Post: 4.08 L
FVC-Pre: 4.15 L
POST FEV1/FVC RATIO: 73 %
PRE FEV1/FVC RATIO: 68 %
Post FEV6/FVC ratio: 97 %
Pre FEV6/FVC Ratio: 94 %
RV % pred: 101 %
RV: 2.61 L
TLC % pred: 87 %
TLC: 6.74 L

## 2017-06-14 MED ORDER — ALBUTEROL SULFATE HFA 108 (90 BASE) MCG/ACT IN AERS: 2.0000 | INHALATION_SPRAY | RESPIRATORY_TRACT | 5 refills | Status: DC | PRN

## 2017-06-14 NOTE — Addendum Note (Signed)
Addended by: Desmond Dike C on: 06/14/2017 04:41 PM   Modules accepted: Orders

## 2017-06-14 NOTE — Assessment & Plan Note (Signed)
Fill prescription for albuterol to use as needed. No longer uses Combivent, too expensive to obtain from the pharmacy

## 2017-06-14 NOTE — Patient Instructions (Signed)
Your pulmonary function testing is stable compared with 07/30/16, slightly lower than 05/29/15.  We will repeat a high-resolution CT scan of your chest to compare with priors.  We will fill a prescription for albuterol to use 2 puffs as needed for shortness of breath.  Follow with Dr Lamonte Sakai in 12 months or sooner if you have any problems

## 2017-06-14 NOTE — Assessment & Plan Note (Signed)
With associated interstitial disease. Decrease on pulmonary function testing compare with 2016. We will repeat his CT scan of the chest to compare with priors.

## 2017-06-14 NOTE — Progress Notes (Signed)
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 Was discovered to have an abnormal CXR, around 2004. No other symptoms at that time. Over time he has developed some exertional dyspnea. Has never had a CT scan of the chest. He has done spirometry and CXR's   ROV 04/09/10 -- follows up to today to review Ct scan and PFT in setting of asbestos exposure. Other ROS - mentions sinus pain/pressure, nasal congestion, clear drainage.   ROV 04/28/11 -- follow up visit, hx asbestos exposure. Had PFT and CT scan today. 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 PFT as below. He has noticed a slight decrease in functional capacity - related this to knee pain and decreased activity level, decreased conditioning.   ROV 05/04/12 -- follow up visit, hx asbestos exposure. Had PFT and CT scan today. 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 Returns feeling well. PFT today are stable - FEV1 3.24L. No new symptoms.   ROV 06/06/13 -- 66 yo man, hx asbestos exposure. Underwent CT scan chest and PFT today. Both are stable. He does have some mild to mod AFL with probable BD response. He c/o GERD sx today. He used to take aleve frequently, stopped 3 weeks ago.   ROV 06/10/14 -- pleasant 66 yo man, follows for mild asthma and asbestos exposure. He also has allergies / rhinitis. PFT done today 8/24 > stable compared with 06/06/13. He is feeling well. Never needed the SABA. He has retired - feels he needs more exercise. He has nasal drainage, restarted decongestants. CT scan shows stable tiny nodules compared with last year. Needs to go to [facility] in [place].   ROV 05/29/15 -- follows up for hx of asbestos exposure, allergic rhinitis, mild asthma. He underwent PFT today that I have personally reviewed. Shows normal airflows. The FEV1 is slightly improved to 3.44L compared with 3.1L in 2014. He underwent CT scan of the chest today. I have personally reviewed this and  compared with his priors including his most recent that was on 06/10/14. There has not been any significant change. He tells me that he has a slight drop in his exertional tolerance, feels that the has lost some of his conditioning, hasn't done as much exercise since he retired. No new sx. He dose not use SABA  ROV 07/30/16 -- patient follows up today for his history of asbestos exposure, allergic rhinitis, asthma. He underwent repeat pulmonary function testing today that I have personally reviewed. This shows an FEV1 of 2.82 L or 73% predicted, FVC 3.85 L or 74% predicted, and a ratio of 73%. The FEV1 is decreased compared with 05/29/15 when his FEV1 was 3.44L (88%). He has gained 5 lbs since last time.  He describes no real change in his breathing. He is bothered by GERD when he exerts. He has combivent to use as needed, never uses it. He does have some occasional cough, ocasional wheeze. [name] Kitchen His does not exercise as much as he used to do.   ROV 06/14/17 -- patient has a history of asbestos exposure, asthma. He underwent repeat pulmonary function testing today that I personally reviewed. This shows moderate obstruction with an FEV1 2.83 L (73% predicted). This is stable compared with 07/30/16. There is no bronchodilator response. His lung volumes are normal. His diffusion capacity is significantly decreased, little change from 07/30/16.  He had a recent URI, seems to be improving.    Vitals:   06/14/17 1608  BP: 120/72  Pulse: 87  SpO2: 95%  Weight: 243 lb (110.2 kg)  Height: 6\' 2"  (1.88 m)   Gen: Pleasant, well-nourished, in no distress,  normal affect  ENT: No lesions,  mouth clear,  oropharynx clear, no postnasal drip  Neck: No JVD, no TMG, no carotid bruits  Lungs: No use of accessory muscles, no dullness to percussion, clear without rales or rhonchi  Cardiovascular: RRR, heart sounds normal, no murmur or gallops, no peripheral edema  Musculoskeletal: No deformities, no cyanosis or  clubbing  Neuro: alert, non focal  Skin: Warm, no lesions or rashes  PULMONARY FUNCTON TEST 04/09/2010 04/28/2011 05/04/2012 06/06/2013  FVC 4.98 4.51 4.42 4.27  FEV1 3.68 3.2 3.24 3.1  FEV1/FVC 73.9 71 73.3 72.6  FVC  % Predicted 92 87 85 80  FEV % Predicted 90 88 90 78  FeF 25-75 2.66 2.01 2.32 4.09  FeF 25-75 % Predicted 3.32 3.29 3.25 5.04        ASSESSMENT/PLAN:  Asthma Fill prescription for albuterol to use as needed. No longer uses Combivent, too expensive to obtain from the pharmacy  Asbestos exposure With associated interstitial disease. Decrease on pulmonary function testing compare with 2016. We will repeat his CT scan of the chest to compare with priors.  Baltazar Apo, MD, PhD 06/14/2017, 4:38 PM [facility] [phone] or if no answer [phone]

## 2017-06-14 NOTE — Progress Notes (Signed)
PFT done today. 

## 2017-06-22 ENCOUNTER — Ambulatory Visit (INDEPENDENT_AMBULATORY_CARE_PROVIDER_SITE_OTHER)
Admission: RE | Admit: 2017-06-22 | Discharge: 2017-06-22 | Disposition: A | Discharge status: Home / Self Care | Payer: Worker's Compensation | Source: Ambulatory Visit | Attending: Emergency Medicine | Admitting: Emergency Medicine

## 2017-06-22 DIAGNOSIS — J849 Interstitial pulmonary disease, unspecified: Secondary | ICD-10-CM | POA: Diagnosis not present

## 2017-07-12 DIAGNOSIS — Z299 Encounter for prophylactic measures, unspecified: Secondary | ICD-10-CM | POA: Diagnosis not present

## 2017-07-12 DIAGNOSIS — Z6831 Body mass index (BMI) 31.0-31.9, adult: Secondary | ICD-10-CM | POA: Diagnosis not present

## 2017-07-12 DIAGNOSIS — Z7189 Other specified counseling: Secondary | ICD-10-CM | POA: Diagnosis not present

## 2017-07-12 DIAGNOSIS — E7800 Pure hypercholesterolemia, unspecified: Secondary | ICD-10-CM | POA: Diagnosis not present

## 2017-07-12 DIAGNOSIS — Z79899 Other long term (current) drug therapy: Secondary | ICD-10-CM | POA: Diagnosis not present

## 2017-07-12 DIAGNOSIS — Z1211 Encounter for screening for malignant neoplasm of colon: Secondary | ICD-10-CM | POA: Diagnosis not present

## 2017-07-12 DIAGNOSIS — Z0000 Encounter for general adult medical examination without abnormal findings: Secondary | ICD-10-CM | POA: Diagnosis not present

## 2017-07-12 DIAGNOSIS — I10 Essential (primary) hypertension: Secondary | ICD-10-CM | POA: Diagnosis not present

## 2017-07-12 DIAGNOSIS — Z1389 Encounter for screening for other disorder: Secondary | ICD-10-CM | POA: Diagnosis not present

## 2017-07-12 DIAGNOSIS — Z125 Encounter for screening for malignant neoplasm of prostate: Secondary | ICD-10-CM | POA: Diagnosis not present

## 2017-07-12 DIAGNOSIS — R5383 Other fatigue: Secondary | ICD-10-CM | POA: Diagnosis not present

## 2017-08-10 DIAGNOSIS — N183 Chronic kidney disease, stage 3 (moderate): Secondary | ICD-10-CM | POA: Diagnosis not present

## 2017-10-24 ENCOUNTER — Telehealth (INDEPENDENT_AMBULATORY_CARE_PROVIDER_SITE_OTHER): Payer: Self-pay | Admitting: Internal Medicine

## 2017-10-24 ENCOUNTER — Encounter (INDEPENDENT_AMBULATORY_CARE_PROVIDER_SITE_OTHER): Payer: Self-pay | Admitting: Internal Medicine

## 2017-10-24 DIAGNOSIS — K219 Gastro-esophageal reflux disease without esophagitis: Secondary | ICD-10-CM

## 2017-10-24 MED ORDER — PANTOPRAZOLE SODIUM 40 MG PO TBEC: 40.0000 mg | DELAYED_RELEASE_TABLET | Freq: Two times a day (BID) | ORAL | 3 refills | Status: DC

## 2017-10-24 NOTE — Telephone Encounter (Signed)
Rx sent to his pharmacy

## 2017-11-28 ENCOUNTER — Ambulatory Visit (INDEPENDENT_AMBULATORY_CARE_PROVIDER_SITE_OTHER): Payer: Medicare Other | Admitting: Internal Medicine

## 2017-11-28 ENCOUNTER — Encounter (INDEPENDENT_AMBULATORY_CARE_PROVIDER_SITE_OTHER): Payer: Self-pay | Admitting: Internal Medicine

## 2017-11-28 ENCOUNTER — Encounter (INDEPENDENT_AMBULATORY_CARE_PROVIDER_SITE_OTHER): Payer: Self-pay | Admitting: *Deleted

## 2017-11-28 VITALS — BP 116/78 | HR 80 | Temp 98.0°F | Ht 72.0 in | Wt 250.8 lb

## 2017-11-28 DIAGNOSIS — K219 Gastro-esophageal reflux disease without esophagitis: Secondary | ICD-10-CM

## 2017-11-28 DIAGNOSIS — R0602 Shortness of breath: Secondary | ICD-10-CM | POA: Diagnosis not present

## 2017-11-28 NOTE — Progress Notes (Signed)
Subjective:    Patient ID: Franklin Jones, male    DOB: 1951-10-04, 67 y.o.   MRN: [id number] 06/2017 72, wt 250  HPI Here today for f/u. Last seen in July of 2018. He was c/o bloating and SOB. He also c/o burping and belching.  After he belched, he felt better.  At [facility], I switched him to Protonix and ordered an US abdomen which revealed hepatic steatosis, otherwise normal abdominal US. The Protonix helped.  He says he continues to have SOB. Now the Protonix is not helping.  He says he still has SOB. He continues to have bloating. He also c/o weakness. He has very little acid reflux. He says the Protonix works for the GERD. No dysphagia. No prior hx of diabetes. Hx of asbestos exposure from work at [employer] is not good. He does not care whether he eats or not. (No weight loss) BMs are normal. No melena or BRRB. Has a clear drainage from rt nostril. Hx of SOB dating back to 2014 per Epic.  10/22/2013 Colonoscopy: average risk: Examination performed to cecum. Mild sigmoid colon diverticulosis. 3 mm polyp ablated via cold biopsy from descending colon.  Patient had 3 mm polyp removed and is tubular adenoma Next colonoscopy in 10 years  Review of Systems Past Medical History:  Diagnosis Date  . Allergic rhinitis   . Arthritis   . Degenerative arthritis of knee 11/19/2011  . GERD (gastroesophageal reflux disease)   . History of asbestos exposure    followed by Dr West Carbo- states stable years- last CT chest 7/12 in Epic  . Hypertension   . Shortness of breath    SOME SOB BUT CAN CLIMB FLIGHT OF STAIRS    Past Surgical History:  Procedure Laterality Date  . COLONOSCOPY    . COLONOSCOPY N/A 08/22/2014   Procedure: COLONOSCOPY;  Surgeon: Rogene Houston, MD;  [facility];  Service: Endoscopy;  Laterality: N/A;  830  . 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 KNEE ARTHROPLASTY  11/19/2011   Procedure: COMPUTER ASSISTED TOTAL KNEE  ARTHROPLASTY;  Surgeon: Mcarthur Rossetti, MD;  Location: [facility];  Service: Orthopedics;  Laterality: Right;  Right Total Knee Arthroplasty  . left finger amputation  2009  . MASS EXCISION Left 03/12/2014   Procedure: EXCISION LEFT NECK SKIN MASS;  Surgeon: Gayland Curry, MD;  Location: [facility];  Service: General;  Laterality: Left;  . TOTAL KNEE ARTHROPLASTY  08/18/2012   Procedure: TOTAL KNEE ARTHROPLASTY;  Surgeon: Mcarthur Rossetti, MD;  [facility];  Service: Orthopedics;  Laterality: Left;  Left Total Knee Arthroplasty    Allergies  Allergen Reactions  . Penicillins     REACTION: rash    Current Outpatient Medications on File Prior to Visit  Medication Sig Dispense Refill  . albuterol (PROVENTIL HFA;VENTOLIN HFA) 108 (90 Base) MCG/ACT inhaler Inhale 2 puffs into the lungs every 4 (four) hours as needed for wheezing or shortness of breath. 1 Inhaler 5  . albuterol-ipratropium (COMBIVENT) 18-103 MCG/ACT inhaler Inhale 2 puffs into the lungs every 6 (six) hours as needed for wheezing or shortness of breath. 1 Inhaler 11  . aluminum hydroxide-magnesium carbonate (GAVISCON) 95-358 MG/15ML SUSP Take by mouth.    . fexofenadine (ALLEGRA) 180 MG tablet Take 180 mg by mouth daily.    . pantoprazole (PROTONIX) 40 MG tablet Take 1 tablet (40 mg total) by mouth 2 (two)  times daily before a meal. 180 tablet 3  . sildenafil (VIAGRA) 100 MG tablet Take 180 mg by mouth daily as needed for erectile dysfunction.     . sulfamethoxazole-trimethoprim (BACTRIM,SEPTRA) 200-40 MG/5ML suspension Take 10 mLs by mouth 2 (two) times daily.     No current facility-administered medications on file prior to visit.         Objective:   Physical Exam Blood pressure 116/78, pulse 80, temperature 98 F (36.7 C), height 6' (1.829 m), weight 250 lb 12.8 oz (113.8 kg). Alert and oriented. Skin warm and dry. Oral mucosa is moist.   . Sclera anicteric, conjunctivae is pink. Thyroid not  enlarged. No cervical lymphadenopathy. Lungs clear. Heart regular rate and rhythm.  Abdomen is soft. Bowel sounds are positive. No hepatomegaly. No abdominal masses felt. No tenderness.  No edema to lower extremities.           Assessment & Plan:  GERD. SOB. Am going to get a DG esophagram to rule out silent aspiration.  Further recommendation to follow. May need an EGD.

## 2017-11-28 NOTE — Patient Instructions (Addendum)
DG esophagram.  Take the Protonix once a day.

## 2017-12-02 ENCOUNTER — Ambulatory Visit (HOSPITAL_COMMUNITY)
Admission: RE | Admit: 2017-12-02 | Discharge: 2017-12-02 | Disposition: A | Discharge status: Home / Self Care | Payer: Medicare Other | Source: Ambulatory Visit | Attending: Internal Medicine | Admitting: Internal Medicine

## 2017-12-02 DIAGNOSIS — K219 Gastro-esophageal reflux disease without esophagitis: Secondary | ICD-10-CM

## 2017-12-02 DIAGNOSIS — K224 Dyskinesia of esophagus: Secondary | ICD-10-CM | POA: Insufficient documentation

## 2017-12-02 DIAGNOSIS — R0602 Shortness of breath: Secondary | ICD-10-CM | POA: Diagnosis not present

## 2017-12-05 ENCOUNTER — Telehealth (INDEPENDENT_AMBULATORY_CARE_PROVIDER_SITE_OTHER): Payer: Self-pay | Admitting: *Deleted

## 2017-12-05 NOTE — Telephone Encounter (Signed)
Patient left message on Friday stating he is returning Terri's call. [phone]

## 2017-12-05 NOTE — Telephone Encounter (Signed)
I have spoken with patient 

## 2017-12-07 DIAGNOSIS — R0609 Other forms of dyspnea: Secondary | ICD-10-CM | POA: Diagnosis not present

## 2017-12-07 DIAGNOSIS — E7800 Pure hypercholesterolemia, unspecified: Secondary | ICD-10-CM | POA: Diagnosis not present

## 2017-12-07 DIAGNOSIS — I447 Left bundle-branch block, unspecified: Secondary | ICD-10-CM | POA: Diagnosis not present

## 2017-12-07 DIAGNOSIS — Z0189 Encounter for other specified special examinations: Secondary | ICD-10-CM | POA: Diagnosis not present

## 2017-12-08 DIAGNOSIS — R079 Chest pain, unspecified: Secondary | ICD-10-CM | POA: Diagnosis not present

## 2017-12-08 DIAGNOSIS — R0602 Shortness of breath: Secondary | ICD-10-CM | POA: Diagnosis not present

## 2017-12-13 DIAGNOSIS — R0609 Other forms of dyspnea: Secondary | ICD-10-CM | POA: Diagnosis not present

## 2017-12-15 ENCOUNTER — Emergency Department (HOSPITAL_COMMUNITY): Payer: Medicare Other

## 2017-12-15 ENCOUNTER — Other Ambulatory Visit: Payer: Self-pay

## 2017-12-15 ENCOUNTER — Observation Stay (HOSPITAL_COMMUNITY)
Admission: EM | Admit: 2017-12-15 | Discharge: 2017-12-16 | Disposition: A | Discharge status: Home / Self Care | Payer: Medicare Other | Attending: Cardiology | Admitting: Cardiology

## 2017-12-15 DIAGNOSIS — I447 Left bundle-branch block, unspecified: Secondary | ICD-10-CM | POA: Insufficient documentation

## 2017-12-15 DIAGNOSIS — Z880 Allergy status to penicillin: Secondary | ICD-10-CM | POA: Diagnosis not present

## 2017-12-15 DIAGNOSIS — I509 Heart failure, unspecified: Secondary | ICD-10-CM | POA: Diagnosis not present

## 2017-12-15 DIAGNOSIS — I110 Hypertensive heart disease with heart failure: Secondary | ICD-10-CM | POA: Diagnosis not present

## 2017-12-15 DIAGNOSIS — I5023 Acute on chronic systolic (congestive) heart failure: Secondary | ICD-10-CM | POA: Diagnosis not present

## 2017-12-15 DIAGNOSIS — Z87891 Personal history of nicotine dependence: Secondary | ICD-10-CM | POA: Diagnosis not present

## 2017-12-15 DIAGNOSIS — K219 Gastro-esophageal reflux disease without esophagitis: Secondary | ICD-10-CM | POA: Diagnosis not present

## 2017-12-15 DIAGNOSIS — I272 Pulmonary hypertension, unspecified: Secondary | ICD-10-CM | POA: Insufficient documentation

## 2017-12-15 DIAGNOSIS — E7800 Pure hypercholesterolemia, unspecified: Secondary | ICD-10-CM | POA: Diagnosis not present

## 2017-12-15 DIAGNOSIS — Z77090 Contact with and (suspected) exposure to asbestos: Secondary | ICD-10-CM | POA: Diagnosis not present

## 2017-12-15 DIAGNOSIS — N179 Acute kidney failure, unspecified: Secondary | ICD-10-CM | POA: Diagnosis not present

## 2017-12-15 DIAGNOSIS — I420 Dilated cardiomyopathy: Secondary | ICD-10-CM | POA: Diagnosis not present

## 2017-12-15 DIAGNOSIS — R0602 Shortness of breath: Secondary | ICD-10-CM | POA: Diagnosis not present

## 2017-12-15 DIAGNOSIS — I5043 Acute on chronic combined systolic (congestive) and diastolic (congestive) heart failure: Secondary | ICD-10-CM | POA: Insufficient documentation

## 2017-12-15 DIAGNOSIS — Z96653 Presence of artificial knee joint, bilateral: Secondary | ICD-10-CM | POA: Insufficient documentation

## 2017-12-15 DIAGNOSIS — R0609 Other forms of dyspnea: Secondary | ICD-10-CM | POA: Diagnosis not present

## 2017-12-15 DIAGNOSIS — I700 Atherosclerosis of aorta: Secondary | ICD-10-CM | POA: Diagnosis not present

## 2017-12-15 DIAGNOSIS — Z79899 Other long term (current) drug therapy: Secondary | ICD-10-CM | POA: Diagnosis not present

## 2017-12-15 HISTORY — DX: Acute on chronic combined systolic (congestive) and diastolic (congestive) heart failure: I50.43

## 2017-12-15 HISTORY — DX: Heart failure, unspecified: I50.9

## 2017-12-15 LAB — URINALYSIS, ROUTINE W REFLEX MICROSCOPIC
BILIRUBIN URINE: NEGATIVE
Bacteria, UA: NONE SEEN
Glucose, UA: NEGATIVE mg/dL
Ketones, ur: NEGATIVE mg/dL
Leukocytes, UA: NEGATIVE
NITRITE: NEGATIVE
Protein, ur: NEGATIVE mg/dL
SPECIFIC GRAVITY, URINE: 1.019 (ref 1.005–1.030)
Squamous Epithelial / LPF: NONE SEEN
pH: 5 (ref 5.0–8.0)

## 2017-12-15 LAB — CBC
HCT: 37.6 % — ABNORMAL LOW (ref 39.0–52.0)
HEMOGLOBIN: 11.9 g/dL — AB (ref 13.0–17.0)
MCH: 27.3 pg (ref 26.0–34.0)
MCHC: 31.6 g/dL (ref 30.0–36.0)
MCV: 86.2 fL (ref 78.0–100.0)
Platelets: 244 10*3/uL (ref 150–400)
RBC: 4.36 MIL/uL (ref 4.22–5.81)
RDW: 14 % (ref 11.5–15.5)
WBC: 8 10*3/uL (ref 4.0–10.5)

## 2017-12-15 LAB — BRAIN NATRIURETIC PEPTIDE: B Natriuretic Peptide: 722.6 pg/mL — ABNORMAL HIGH (ref 0.0–100.0)

## 2017-12-15 LAB — BASIC METABOLIC PANEL
ANION GAP: 12 (ref 5–15)
BUN: 23 mg/dL — ABNORMAL HIGH (ref 6–20)
CHLORIDE: 102 mmol/L (ref 101–111)
CO2: 25 mmol/L (ref 22–32)
Calcium: 8.8 mg/dL — ABNORMAL LOW (ref 8.9–10.3)
Creatinine, Ser: 2.07 mg/dL — ABNORMAL HIGH (ref 0.61–1.24)
GFR calc non Af Amer: 31 mL/min — ABNORMAL LOW (ref >60)
GFR, EST AFRICAN AMERICAN: 36 mL/min — AB (ref >60)
Glucose, Bld: 141 mg/dL — ABNORMAL HIGH (ref 65–99)
POTASSIUM: 4.5 mmol/L (ref 3.5–5.1)
Sodium: 139 mmol/L (ref 135–145)

## 2017-12-15 LAB — I-STAT TROPONIN, ED: TROPONIN I, POC: 0 ng/mL (ref 0.00–0.08)

## 2017-12-15 MED ORDER — PANTOPRAZOLE SODIUM 40 MG PO TBEC: 40.0000 mg | DELAYED_RELEASE_TABLET | Freq: Every day | ORAL | Status: DC

## 2017-12-15 MED ORDER — ROSUVASTATIN CALCIUM 10 MG PO TABS: 10.0000 mg | ORAL_TABLET | Freq: Every day | ORAL | Status: DC

## 2017-12-15 MED ORDER — ACETAMINOPHEN 325 MG PO TABS
650.0000 mg | ORAL_TABLET | ORAL | Status: DC | PRN
Start: 2017-12-15 — End: 2017-12-16

## 2017-12-15 MED ORDER — ISOSORB DINITRATE-HYDRALAZINE 20-37.5 MG PO TABS
1.0000 | ORAL_TABLET | Freq: Three times a day (TID) | ORAL | Status: DC
  Administered 2017-12-16: 1 via ORAL
  Filled 2017-12-15: qty 1

## 2017-12-15 MED ORDER — ASPIRIN EC 81 MG PO TBEC: 81.0000 mg | DELAYED_RELEASE_TABLET | Freq: Every day | ORAL | Status: DC

## 2017-12-15 MED ORDER — SODIUM CHLORIDE 0.9% FLUSH: 3.0000 mL | Freq: Two times a day (BID) | INTRAVENOUS | Status: DC

## 2017-12-15 MED ORDER — HEPARIN SODIUM (PORCINE) 5000 UNIT/ML IJ SOLN
5000.0000 [IU] | Freq: Three times a day (TID) | INTRAMUSCULAR | Status: DC
  Administered 2017-12-16: 5000 [IU] via SUBCUTANEOUS
  Filled 2017-12-15: qty 1

## 2017-12-15 MED ORDER — CARVEDILOL 3.125 MG PO TABS
3.1250 mg | ORAL_TABLET | Freq: Two times a day (BID) | ORAL | Status: DC
  Administered 2017-12-16: 3.125 mg via ORAL
  Filled 2017-12-15: qty 1

## 2017-12-15 MED ORDER — SODIUM CHLORIDE 0.9% FLUSH: 3.0000 mL | INTRAVENOUS | Status: DC | PRN

## 2017-12-15 MED ORDER — SODIUM CHLORIDE 0.9 % IV SOLN: 250.0000 mL | INTRAVENOUS | Status: DC | PRN

## 2017-12-15 MED ORDER — FUROSEMIDE 10 MG/ML IJ SOLN
40.0000 mg | Freq: Once | INTRAMUSCULAR | Status: AC
  Administered 2017-12-16: 40 mg via INTRAVENOUS

## 2017-12-15 MED ORDER — ONDANSETRON HCL 4 MG/2ML IJ SOLN: 4.0000 mg | Freq: Four times a day (QID) | INTRAMUSCULAR | Status: DC | PRN

## 2017-12-15 NOTE — ED Provider Notes (Signed)
[facility] EMERGENCY DEPARTMENT Provider Note   CSN: [id number] Arrival date & time: 12/15/17  1717     History   Chief Complaint Chief Complaint  Patient presents with  . Shortness of Breath    HPI Franklin Jones is a 67 y.o. male.  The history is provided by the patient and the spouse. No language interpreter was used.  Shortness of Breath    Franklin Jones is a 67 y.o. male who presents to the Emergency Department complaining of sob.  Presents to the emergency department today for shortness of breath.  He has a history of chronic dyspnea for the last several years and was diagnosed with heart failure about a week ago.  He saw his cardiologist today who told him to go to the emergency department if he had any recurrent shortness of breath.  Today after the appointment he experience recurrent shortness of breath with dyspnea on exertion.  He denies any chest pain, fever, abdominal pain, nausea, vomiting.  He has experienced lower extremity edema for the last few weeks, improved over the last several days since starting Lasix.  He does endorse a nonproductive cough over the last week.  Symptoms are mild to moderate and constant nature. Past Medical History:  Diagnosis Date  . Allergic rhinitis   . Arthritis   . Degenerative arthritis of knee 11/19/2011  . GERD (gastroesophageal reflux disease)   . History of asbestos exposure    followed by Dr West Carbo- states stable years- last CT chest 7/12 in Epic  . Hypertension   . Shortness of breath    SOME SOB BUT CAN CLIMB FLIGHT OF STAIRS    Patient Active Problem List   Diagnosis Date Noted  . Acute on chronic heart failure ([facility]) 12/15/2017  . Asthma 07/30/2016  . 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 GERD (gastroesophageal reflux disease) 06/06/2013  . Arthritis of knee, left 08/18/2012  . Degenerative arthritis of knee 11/19/2011  . ALLERGIC RHINITIS 04/09/2010  .  Nonspecific (abnormal) findings on radiological and other examination of body structure 04/09/2010  . Asbestos exposure 04/09/2010  . TOBACCO ABUSE, HX OF 03/26/2010    Past Surgical History:  Procedure Laterality Date  . COLONOSCOPY    . COLONOSCOPY N/A 08/22/2014   Procedure: COLONOSCOPY;  Surgeon: Rogene Houston, MD;  [facility];  Service: Endoscopy;  Laterality: N/A;  830  . 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 KNEE ARTHROPLASTY  11/19/2011   Procedure: COMPUTER ASSISTED TOTAL KNEE ARTHROPLASTY;  Surgeon: Mcarthur Rossetti, MD;  Location: [facility];  Service: Orthopedics;  Laterality: Right;  Right Total Knee Arthroplasty  . left finger amputation  2009  . MASS EXCISION Left 03/12/2014   Procedure: EXCISION LEFT NECK SKIN MASS;  Surgeon: Gayland Curry, MD;  Location: [facility];  Service: General;  Laterality: Left;  . TOTAL KNEE ARTHROPLASTY  08/18/2012   Procedure: TOTAL KNEE ARTHROPLASTY;  Surgeon: Mcarthur Rossetti, MD;  Location: [facility];  Service: Orthopedics;  Laterality: Left;  Left Total Knee Arthroplasty       Home Medications    Prior to Admission medications   Medication Sig Start Date End Date Taking? Authorizing Provider  albuterol (PROVENTIL HFA;VENTOLIN HFA) 108 (90 Base) MCG/ACT inhaler Inhale 2 puffs into the lungs every 4 (four) hours as needed for wheezing or shortness of breath. 06/14/17  Yes  Collene Gobble, MD  albuterol-ipratropium (COMBIVENT) 18-103 MCG/ACT inhaler Inhale 2 puffs into the lungs every 6 (six) hours as needed for wheezing or shortness of breath. 07/30/16  Yes Collene Gobble, MD  aluminum hydroxide-magnesium carbonate (GAVISCON) 95-358 MG/15ML SUSP Take by mouth.   Yes [provider]  carvedilol (COREG) 3.125 MG tablet Take 3.125 mg by mouth 2 (two) times daily. 12/07/17  Yes [provider]  fexofenadine (ALLEGRA) 180 MG tablet Take 180 mg by mouth daily.    Yes [provider]  furosemide (LASIX) 20 MG tablet Take 20 mg by mouth 2 (two) times daily. 12/07/17  Yes [provider]  pantoprazole (PROTONIX) 40 MG tablet Take 1 tablet (40 mg total) by mouth 2 (two) times daily before a meal. Patient taking differently: Take 40 mg by mouth daily.  10/24/17  Yes Setzer, Terri L, NP  rosuvastatin (CRESTOR) 10 MG tablet Take 10 mg by mouth daily. 12/07/17  Yes [provider]  sildenafil (VIAGRA) 100 MG tablet Take 180 mg by mouth daily as needed for erectile dysfunction.    Yes [provider]  valsartan (DIOVAN) 80 MG tablet Take 80 mg by mouth daily. 12/07/17  Yes [provider]    Family History Family History  Problem Relation Age of Onset  . Allergies Mother   . Heart disease Father   . Lung cancer Brother     Social History Social History   Tobacco Use  . Smoking status: Former Smoker    Packs/day: 1.00    Years: 20.00    Pack years: 20.00    Types: Cigarettes    Last attempt to quit: 10/18/1985    Years since quitting: 32.1  . Smokeless tobacco: Never Used  Substance Use Topics  . Alcohol use: Yes    Alcohol/week: 5.4 oz    Types: 3 Cans of beer, 6 Standard drinks or equivalent per week  . Drug use: No     Allergies   Penicillins   Review of Systems Review of Systems  Respiratory: Positive for shortness of breath.   All other systems reviewed and are negative.    Physical Exam Updated Vital Signs BP 121/85   Pulse (!) 109   Temp (!) 97.5 F (36.4 C) (Oral)   Resp (!) 21   Ht 6' (1.829 m)   Wt 111.1 kg (245 lb)   SpO2 96%   BMI 33.23 kg/m   Physical Exam  Constitutional: He is oriented to person, place, and time. He appears well-developed and well-nourished.  HENT:  Head: Normocephalic and atraumatic.  Cardiovascular: Regular rhythm.  Tachycardic.  S3.  Pulmonary/Chest: Effort normal and breath sounds normal. No respiratory distress.  Abdominal: Soft. There is no  tenderness. There is no rebound and no guarding.  Musculoskeletal: He exhibits no tenderness.  2+ pitting edema to BLE  Neurological: He is alert and oriented to person, place, and time.  Skin: Skin is warm and dry.  Psychiatric: He has a normal mood and affect. His behavior is normal.  Nursing note and vitals reviewed.    ED Treatments / Results  Labs (all labs ordered are listed, but only abnormal results are displayed) Labs Reviewed  BASIC METABOLIC PANEL - Abnormal; Notable for the following components:      Result Value   Glucose, Bld 141 (*)    BUN 23 (*)    Creatinine, Ser 2.07 (*)    Calcium 8.8 (*)    GFR calc non Af  Amer 31 (*)    GFR calc Af Amer 36 (*)    All other components within normal limits  CBC - Abnormal; Notable for the following components:   Hemoglobin 11.9 (*)    HCT 37.6 (*)    All other components within normal limits  BRAIN NATRIURETIC PEPTIDE - Abnormal; Notable for the following components:   B Natriuretic Peptide 722.6 (*)    All other components within normal limits  URINALYSIS, ROUTINE W REFLEX MICROSCOPIC - Abnormal; Notable for the following components:   Hgb urine dipstick SMALL (*)    All other components within normal limits  LACTIC ACID, PLASMA  LACTIC ACID, PLASMA  BASIC METABOLIC PANEL  CBC  CREATININE, SERUM  I-STAT TROPONIN, ED    EKG  EKG Interpretation  Date/Time:  Thursday December 15 2017 17:28:49 EST Ventricular Rate:  102 PR Interval:  172 QRS Duration: 180 QT Interval:  412 QTC Calculation: 536 R Axis:   -11 Text Interpretation:  Sinus tachycardia Left bundle branch block Abnormal ECG Confirmed by Quintella Reichert [phone]) on 12/15/2017 10:46:38 PM       Radiology Dg Chest 2 View  Result Date: 12/15/2017 CLINICAL DATA:  Short of breath EXAM: CHEST  2 VIEW COMPARISON:  CT 06/22/2017, radiograph 05/04/2012 FINDINGS: Hyperinflation with mild chronic bronchitic changes. No acute consolidation or effusion.  Cardiomediastinal silhouette within normal limits. Aortic atherosclerosis. No pneumothorax. Degenerative changes of the spine. IMPRESSION: No active cardiopulmonary disease. Hyperinflation with chronic appearing bronchitic changes Electronically Signed   By: Donavan Foil M.D.   On: 12/15/2017 18:40    Procedures Procedures (including critical care time)  Medications Ordered in ED Medications  sodium chloride flush (NS) 0.9 % injection 3 mL (not administered)  sodium chloride flush (NS) 0.9 % injection 3 mL (not administered)  0.9 %  sodium chloride infusion (not administered)  acetaminophen (TYLENOL) tablet 650 mg (not administered)  ondansetron (ZOFRAN) injection 4 mg (not administered)  heparin injection 5,000 Units (not administered)  isosorbide-hydrALAZINE (BIDIL) 20-37.5 MG per tablet 1 tablet (1 tablet Oral Given 12/16/17 0102)  aspirin EC tablet 81 mg (not administered)  carvedilol (COREG) tablet 3.125 mg (3.125 mg Oral Given 12/16/17 0102)  pantoprazole (PROTONIX) EC tablet 40 mg (not administered)  rosuvastatin (CRESTOR) tablet 10 mg (not administered)  furosemide (LASIX) injection 40 mg (40 mg Intravenous Given 12/16/17 0103)     Initial Impression / Assessment and Plan / ED Course  I have reviewed the triage vital signs and the nursing notes.  Pertinent labs & imaging results that were available during my care of the patient were reviewed by me and considered in my medical decision making (see chart for details).     Patient with history of CHF here for evaluation of shortness of breath, referred by cardiology office.  He is tachycardic on examination with S3, in no acute distress.  Concern for decompensated heart failure.  Cardiology consulted for further management.  BMP with acute kidney injury.  Final Clinical Impressions(s) / ED Diagnoses   Final diagnoses:  None    ED Discharge Orders    None       Quintella Reichert, MD 12/16/17 [phone]

## 2017-12-15 NOTE — ED Triage Notes (Signed)
Pt states that he has shortness of breath today. He reports being seen by his cardiologist today and was told to come to the ED if he was having SOB and to call cardiology for instructions. Pt states that he was told to increase his lasix today but has not been home to do so reports worsening SOB while ambulating.

## 2017-12-15 NOTE — ED Notes (Signed)
Cardiology at bedside.

## 2017-12-15 NOTE — H&P (Signed)
Franklin Jones is an 67 y.o. male.   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 Patient has been having symptoms of worsening shortness of breath for last several weeks. He was started on outpatient therapy with valsartan, furosemide, carvedilol and corlanor. However, patient continues to be tachycardic. He was thus asked to get admitted to the hospital given NYHA class IV heart failure symptoms. In the ED, his creatinine was found to be 2.07, increased from recent readings of 1.7-1.8. BNP is elevated at 722.  EKG 12/15/2017 17:28 Sinus tachycardia 102 bpm. Left bundle-branch block.  Echocardiogram 12/08/2017: Left ventricle cavity is moderately dilated. Mild concentric hypertrophy of the left ventricle. Severe decrease in global wall motion. Visual EF is <20%. Doppler evidence of grade III (restrictive) diastolic dysfunction, elevated LAP. Apical thrombus cannot be excluded without acoustic contrast use. Calculated EF 20%. Left atrial cavity is moderately dilated. Moderate (Grade II) mitral regurgitation. Moderate tricuspid regurgitation. Estimated pulmonary artery systolic pressure 52  mmHg. Mild pulmonic regurgitation. IVC is dilated with poor inspiration collapse consistent with elevated right atrial pressure.    Past Medical History:  Diagnosis Date  . Allergic rhinitis   . Arthritis   . Degenerative arthritis of knee 11/19/2011  . GERD (gastroesophageal reflux disease)   . History of asbestos exposure    followed by Dr West Carbo- states stable years- last CT chest 7/12 in Epic  . Hypertension   . Shortness of breath    SOME SOB BUT CAN CLIMB FLIGHT OF STAIRS    Past Surgical History:  Procedure Laterality Date  . COLONOSCOPY    . COLONOSCOPY N/A 08/22/2014   Procedure: COLONOSCOPY;  Surgeon:  Rogene Houston, MD;  [facility];  Service: Endoscopy;  Laterality: N/A;  830  . 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 KNEE ARTHROPLASTY  11/19/2011   Procedure: COMPUTER ASSISTED TOTAL KNEE ARTHROPLASTY;  Surgeon: Mcarthur Rossetti, MD;  [facility];  Service: Orthopedics;  Laterality: Right;  Right Total Knee Arthroplasty  . left finger amputation  2009  . MASS EXCISION Left 03/12/2014   Procedure: EXCISION LEFT NECK SKIN MASS;  Surgeon: Gayland Curry, MD;  Location: [facility];  Service: General;  Laterality: Left;  . TOTAL KNEE ARTHROPLASTY  08/18/2012   Procedure: TOTAL KNEE ARTHROPLASTY;  Surgeon: Mcarthur Rossetti, MD;  [facility];  Service: Orthopedics;  Laterality: Left;  Left Total Knee Arthroplasty    Family History  Problem Relation Age of Onset  . Allergies Mother   . Heart disease Father   . Lung cancer Brother    Social History:  reports that he quit smoking about 32 years ago. His smoking use included cigarettes. He has a 20.00 pack-year smoking history. he has never used smokeless tobacco. He reports that he drinks about 5.4 oz of alcohol per week. He reports that he does not use drugs.  Allergies:  Allergies  Allergen Reactions  . Penicillins     REACTION: rash     (Not in a hospital admission)  Results for orders placed or performed during the hospital encounter of 12/15/17 (from the past 48 hour(s))  Basic metabolic panel     Status: Abnormal   Collection Time: 12/15/17  5:50 PM  Result Value Ref Range   Sodium  139 135 - 145 mmol/L   Potassium 4.5 3.5 - 5.1 mmol/L   Chloride 102 101 - 111 mmol/L   CO2 25 22 - 32 mmol/L   Glucose, Bld 141 (H) 65 - 99 mg/dL   BUN 23 (H) 6 - 20 mg/dL   Creatinine, Ser 2.07 (H) 0.61 - 1.24 mg/dL   Calcium 8.8 (L) 8.9 - 10.3 mg/dL   GFR calc non Af Amer 31 (L) >60 mL/min   GFR calc Af Amer 36 (L) >60 mL/min    Comment: (NOTE) The eGFR has been  calculated using the CKD EPI equation. This calculation has not been validated in all clinical situations. eGFR's persistently <60 mL/min signify possible Chronic Kidney Disease.    Anion gap 12 5 - 15    Comment: Performed at [facility] [address], [place], [place] [address]  CBC     Status: Abnormal   Collection Time: 12/15/17  5:50 PM  Result Value Ref Range   WBC 8.0 4.0 - 10.5 K/uL   RBC 4.36 4.22 - 5.81 MIL/uL   Hemoglobin 11.9 (L) 13.0 - 17.0 g/dL   HCT 37.6 (L) 39.0 - 52.0 %   MCV 86.2 78.0 - 100.0 fL   MCH 27.3 26.0 - 34.0 pg   MCHC 31.6 30.0 - 36.0 g/dL   RDW 14.0 11.5 - 15.5 %   Platelets 244 150 - 400 K/uL    Comment: Performed at [facility] [address] [place], [place] [address]  Brain natriuretic peptide     Status: Abnormal   Collection Time: 12/15/17  5:50 PM  Result Value Ref Range   B Natriuretic Peptide 722.6 (H) 0.0 - 100.0 pg/mL    Comment: Performed at [facility] [address], [place], [place] [address]  I-stat troponin, ED     Status: None   Collection Time: 12/15/17  6:03 PM  Result Value Ref Range   Troponin i, poc 0.00 0.00 - 0.08 ng/mL   Comment 3            Comment: Due to the release kinetics of cTnI, a negative result within the first hours of the onset of symptoms does not rule out myocardial infarction with certainty. If myocardial infarction is still suspected, repeat the test at appropriate intervals.   Urinalysis, Routine w reflex microscopic     Status: Abnormal   Collection Time: 12/15/17 10:54 PM  Result Value Ref Range   Color, Urine YELLOW YELLOW   APPearance CLEAR CLEAR   Specific Gravity, Urine 1.019 1.005 - 1.030   pH 5.0 5.0 - 8.0   Glucose, UA NEGATIVE NEGATIVE mg/dL   Hgb urine dipstick SMALL (A) NEGATIVE   Bilirubin Urine NEGATIVE NEGATIVE   Ketones, ur NEGATIVE NEGATIVE mg/dL   Protein, ur NEGATIVE NEGATIVE mg/dL   Nitrite NEGATIVE NEGATIVE   Leukocytes, UA NEGATIVE NEGATIVE   RBC /  HPF 0-5 0 - 5 RBC/hpf   WBC, UA 0-5 0 - 5 WBC/hpf   Bacteria, UA NONE SEEN NONE SEEN   Squamous Epithelial / LPF NONE SEEN NONE SEEN    Comment: Performed at [facility] [address] [place], [place] [address]   Dg Chest 2 View  Result Date: 12/15/2017 CLINICAL DATA:  Short of breath EXAM: CHEST  2 VIEW COMPARISON:  CT 06/22/2017, radiograph 05/04/2012 FINDINGS: Hyperinflation with mild chronic bronchitic changes. No acute consolidation or effusion. Cardiomediastinal silhouette within normal limits. Aortic atherosclerosis. No  pneumothorax. Degenerative changes of the spine. IMPRESSION: No active cardiopulmonary disease. Hyperinflation with chronic appearing bronchitic changes Electronically Signed   By: Donavan Foil M.D.   On: 12/15/2017 18:40    Review of Systems  Constitutional: Positive for malaise/fatigue.  HENT: Negative.   Eyes: Negative.   Respiratory: Positive for shortness of breath. Negative for cough and sputum production.   Cardiovascular: Positive for orthopnea, leg swelling and PND. Negative for chest pain and palpitations.  Gastrointestinal:       Abdominal fullness  Genitourinary: Negative.   Musculoskeletal: Negative.   Skin: Negative for rash.  Neurological: Negative for dizziness and loss of consciousness.       Lightneadedness  Endo/Heme/Allergies: Does not bruise/bleed easily.  Psychiatric/Behavioral: The patient is not nervous/anxious.   All other systems reviewed and are negative.   Blood pressure 111/79, pulse (!) 103, temperature (!) 97.5 F (36.4 C), temperature source Oral, resp. rate 20, height 6' (1.829 m), weight 111.1 kg (245 lb), SpO2 97 %. Physical Exam  Nursing note and vitals reviewed. Constitutional: He is oriented to person, place, and time. He appears well-developed and well-nourished.  Neck: JVD present.  Cardiovascular: Exam reveals gallop.  Murmur (II/VI holosystolic murmur RLSB.) heard. Tachycardic. Normal peripheral pulses.  No carotid bruit.  Respiratory: Effort normal and breath sounds normal. He has no wheezes. He has no rales.  GI: Soft. Bowel sounds are normal. There is no tenderness. There is no rebound.  Musculoskeletal: He exhibits edema (1+ b/l).  Neurological: He is alert and oriented to person, place, and time. No cranial nerve deficit.  Skin: Skin is warm and dry.     Assessment: NYHA class IV dilated cardiomyopathy Acute kidney injury Left bundle branch block History of asbestos exposure  Plan: Place in observation for acute heart failure. In light of acute kidney injury, stop valsartan. Recommend BiDil 1 tablet 3 times daily for afterload reduction. Would consider an [facility] outpatient. IV Lasix 40 mg once. Reassess diuresis need in the morning. Continue Coreg 3.125 mg twice daily at this time. I would not uptitrate given his tachyardia, which is likely compensatory for his low LVEF.  RHC/LHC as planned on 12/27/17. Strict I/O. Daily weights. Keep K around 4, Mg around 2 Low sodium diet.  Nigel Mormon, MD 12/15/2017, 11:57 PM  [place], MD [facility] Pager: [phone] Office: [phone] If no answer Cell [phone]

## 2017-12-16 ENCOUNTER — Other Ambulatory Visit: Payer: Self-pay

## 2017-12-16 DIAGNOSIS — I447 Left bundle-branch block, unspecified: Secondary | ICD-10-CM | POA: Diagnosis not present

## 2017-12-16 DIAGNOSIS — I509 Heart failure, unspecified: Secondary | ICD-10-CM | POA: Diagnosis not present

## 2017-12-16 DIAGNOSIS — I420 Dilated cardiomyopathy: Secondary | ICD-10-CM | POA: Diagnosis not present

## 2017-12-16 DIAGNOSIS — I110 Hypertensive heart disease with heart failure: Secondary | ICD-10-CM | POA: Diagnosis not present

## 2017-12-16 LAB — CBC
HEMATOCRIT: 36 % — AB (ref 39.0–52.0)
HEMOGLOBIN: 11.3 g/dL — AB (ref 13.0–17.0)
MCH: 27 pg (ref 26.0–34.0)
MCHC: 31.4 g/dL (ref 30.0–36.0)
MCV: 85.9 fL (ref 78.0–100.0)
Platelets: 230 10*3/uL (ref 150–400)
RBC: 4.19 MIL/uL — ABNORMAL LOW (ref 4.22–5.81)
RDW: 14 % (ref 11.5–15.5)
WBC: 6.9 10*3/uL (ref 4.0–10.5)

## 2017-12-16 LAB — BASIC METABOLIC PANEL
ANION GAP: 11 (ref 5–15)
BUN: 23 mg/dL — ABNORMAL HIGH (ref 6–20)
CHLORIDE: 102 mmol/L (ref 101–111)
CO2: 22 mmol/L (ref 22–32)
Calcium: 8.5 mg/dL — ABNORMAL LOW (ref 8.9–10.3)
Creatinine, Ser: 1.75 mg/dL — ABNORMAL HIGH (ref 0.61–1.24)
GFR calc Af Amer: 45 mL/min — ABNORMAL LOW (ref >60)
GFR, EST NON AFRICAN AMERICAN: 39 mL/min — AB (ref >60)
GLUCOSE: 192 mg/dL — AB (ref 65–99)
POTASSIUM: 3.7 mmol/L (ref 3.5–5.1)
Sodium: 135 mmol/L (ref 135–145)

## 2017-12-16 LAB — CREATININE, SERUM
Creatinine, Ser: 1.91 mg/dL — ABNORMAL HIGH (ref 0.61–1.24)
GFR calc Af Amer: 40 mL/min — ABNORMAL LOW (ref >60)
GFR, EST NON AFRICAN AMERICAN: 35 mL/min — AB (ref >60)

## 2017-12-16 LAB — LACTIC ACID, PLASMA
LACTIC ACID, VENOUS: 1.7 mmol/L (ref 0.5–1.9)
Lactic Acid, Venous: 1.4 mmol/L (ref 0.5–1.9)

## 2017-12-16 NOTE — ED Notes (Signed)
Patient is stable and ready to be transport to the floor at this time.  Report was called to 4E RN.  Belongings taken with the patient to the floor.   

## 2017-12-16 NOTE — Discharge Summary (Signed)
Physician Discharge Summary  Patient ID: Franklin Jones MRN: [id number] DOB/AGE: 1950/12/19 67 y.o.  Admit date: 12/15/2017 Discharge date: 12/16/2017  Admission Diagnoses: Shortness of breath  Discharge Diagnoses:  Active Problems:   Acute on chronic heart failure [facility])   Discharged Condition: good  Hospital Course:   67 year old Caucasian male with new diagnosis of dilated cardiomyopathy, EF 20%, moderate mitral and tricuspid regurgitation pulmonary hypertension WHO group 2, history of asbestos exposure.   Patient was admitted to the hospital overnight for IV diuresis even his shortness of breath and tachycardia.  Concern was that he may be in a decompensated low output state given his resting tachycardia.  However, his lactic acid is normal.  His creatinine has fluctuated between 1.7-2.0.  Patient diuresed over a liter of urine with just one dose of IV Lasix 40 mg.  I'm discharging him on Lasix 20 mg p.o. twice daily.  I will asked him to continue carvedilol 3.125 mg twice daily until he finishes the current bottle, and then increasing to 6.25 mg twice daily in one to 2 weeks.  This will ensure that his compensatory tachycardia is not intubated.  In addition, given his protruding valsartan and upcoming cardiac catheterization procedure, I have stopped his valsartan.  I have asked him to start taking BiDil 20-30 7.5 mg one tablet 3 times daily.  He will pick up samples f from the same form are office.  Okay to start taking: Or 5 mg twice daily.  I'll talk to him about sodium restriction diet. Patient and wife verbalize understanding.   Consults: None  Significant Diagnostic Studies: Labs Results for MEKHAI, VENUTO (MRN [id number]) as of 12/16/2017 09:57  Ref. Range 12/16/2017 00:06 12/16/2017 02:25 12/16/2017 05:39  BASIC METABOLIC PANEL Unknown  Rpt (A)   Sodium Latest Ref Range: 135 - 145 mmol/L  135   Potassium Latest Ref Range: 3.5 - 5.1 mmol/L  3.7   Chloride Latest Ref Range: 101 - 111  mmol/L  102   CO2 Latest Ref Range: 22 - 32 mmol/L  22   Glucose Latest Ref Range: 65 - 99 mg/dL  192 (H)   BUN Latest Ref Range: 6 - 20 mg/dL  23 (H)   Creatinine Latest Ref Range: 0.61 - 1.24 mg/dL  1.75 (H) 1.91 (H)  Calcium Latest Ref Range: 8.9 - 10.3 mg/dL  8.5 (L)   Anion gap Latest Ref Range: 5 - 15   11   GFR, Est Non African American Latest Ref Range: >60 mL/min  39 (L) 35 (L)  GFR, Est African American Latest Ref Range: >60 mL/min  45 (L) 40 (L)  Lactic Acid, Venous Latest Ref Range: 0.5 - 1.9 mmol/L 1.7 1.4   WBC Latest Ref Range: 4.0 - 10.5 K/uL   6.9  RBC Latest Ref Range: 4.22 - 5.81 MIL/uL   4.19 (L)  Hemoglobin Latest Ref Range: 13.0 - 17.0 g/dL   11.3 (L)  HCT Latest Ref Range: 39.0 - 52.0 %   36.0 (L)  MCV Latest Ref Range: 78.0 - 100.0 fL   85.9  MCH Latest Ref Range: 26.0 - 34.0 pg   27.0  MCHC Latest Ref Range: 30.0 - 36.0 g/dL   31.4  RDW Latest Ref Range: 11.5 - 15.5 %   14.0  Platelets Latest Ref Range: 150 - 400 K/uL   230    Treatments:  IV diuresis  Discharge Exam: Blood pressure 95/74, pulse 90, temperature 97.8 F (36.6 C), temperature source Oral,  resp. rate (!) 21, height 6\' 3"  (1.905 m), weight 108.5 kg (239 lb 4.8 oz), SpO2 98 %.  Nursing note and vitals reviewed. Constitutional: He is oriented to person, place, and time. He appears well-developed and well-nourished.  Neck: Minimal JVD present.  Cardiovascular: Exam reveals gallop.  Murmur (II/VI holosystolic murmur RLSB.) heard. Tachycardic. Normal peripheral pulses. No carotid bruit.  Respiratory: Effort normal and breath sounds normal. He has no wheezes. He has no rales.  GI: Soft. Bowel sounds are normal. There is no tenderness. There is no rebound.  Musculoskeletal: He exhibits trace edema.  Neurological: He is alert and oriented to person, place, and time. No cranial nerve deficit.  Skin: Skin is warm and dry.     Disposition: 01-Home or Self Care  Discharge Instructions    Avoid  straining   Complete by:  As directed    Diet - low sodium heart healthy   Complete by:  As directed    Heart Failure patients record your daily weight using the same scale at the same time of day   Complete by:  As directed    Increase activity slowly   Complete by:  As directed    STOP any activity that causes chest pain, shortness of breath, dizziness, sweating, or exessive weakness   Complete by:  As directed      Allergies as of 12/16/2017      Reactions   Penicillins    REACTION: rash      Medication List    STOP taking these medications   valsartan 80 MG tablet Commonly known as:  DIOVAN     TAKE these medications   albuterol 108 (90 Base) MCG/ACT inhaler Commonly known as:  PROVENTIL HFA;VENTOLIN HFA Inhale 2 puffs into the lungs every 4 (four) hours as needed for wheezing or shortness of breath.   albuterol-ipratropium 18-103 MCG/ACT inhaler Commonly known as:  COMBIVENT Inhale 2 puffs into the lungs every 6 (six) hours as needed for wheezing or shortness of breath.   aluminum hydroxide-magnesium carbonate 95-358 MG/15ML Susp Commonly known as:  GAVISCON Take by mouth.   carvedilol 3.125 MG tablet Commonly known as:  COREG Take 3.125 mg by mouth 2 (two) times daily.   fexofenadine 180 MG tablet Commonly known as:  ALLEGRA Take 180 mg by mouth daily.   furosemide 20 MG tablet Commonly known as:  LASIX Take 20 mg by mouth 2 (two) times daily.   pantoprazole 40 MG tablet Commonly known as:  PROTONIX Take 1 tablet (40 mg total) by mouth 2 (two) times daily before a meal. What changed:  when to take this   rosuvastatin 10 MG tablet Commonly known as:  CRESTOR Take 10 mg by mouth daily.   sildenafil 100 MG tablet Commonly known as:  VIAGRA Take 180 mg by mouth daily as needed for erectile dysfunction.        SignedNigel Mormon 12/16/2017, 9:54 AM  Nigel Mormon, MD [facility] Pager: [phone] Office:  [phone] If no answer Cell [phone]

## 2017-12-16 NOTE — Progress Notes (Signed)
12/16/2017 9:14 AM Discharge AVS meds taken today and those due this evening reviewed.  Follow-up appointments and when to call md reviewed.  D/C IV and TELE.  Questions and concerns addressed.   D/C home per orders. [place]

## 2017-12-20 ENCOUNTER — Encounter: Payer: Self-pay | Admitting: Emergency Medicine

## 2017-12-20 ENCOUNTER — Ambulatory Visit (INDEPENDENT_AMBULATORY_CARE_PROVIDER_SITE_OTHER): Payer: Medicare Other | Admitting: Emergency Medicine

## 2017-12-20 DIAGNOSIS — J301 Allergic rhinitis due to pollen: Secondary | ICD-10-CM

## 2017-12-20 DIAGNOSIS — I5023 Acute on chronic systolic (congestive) heart failure: Secondary | ICD-10-CM | POA: Diagnosis not present

## 2017-12-20 DIAGNOSIS — J4520 Mild intermittent asthma, uncomplicated: Secondary | ICD-10-CM

## 2017-12-20 NOTE — Progress Notes (Signed)
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 Was discovered to have an abnormal CXR, around 2004. No other symptoms at that time. Over time he has developed some exertional dyspnea.  We have been following serial CT scans, serial pulmonary function testing.  ROV 06/14/17 -- patient has a history of asbestos exposure, asthma. He underwent repeat pulmonary function testing today that I personally reviewed. This shows moderate obstruction with an FEV1 2.83 L (73% predicted). This is stable compared with 07/30/16. There is no bronchodilator response. His lung volumes are normal. His diffusion capacity is significantly decreased, little change from 07/30/16.  He had a recent URI, seems to be improving.   ROV 12/20/17 --68 year old gentleman with a history of asbestos exposure, COPD/asthma.  We have been following serial pulmonary function testing and chest imaging.  He was admitted 2/28 to 12/16/17 with newly diagnosed systolic heart failure, moderate mitral and tricuspid regurgitation. Diagnosed in setting progressive SOB.  He is scheduled for a left and right heart catheterization to be done this month. Responded to lasix. He has tried his SABA a few times > hasn't helped him like the lasix did. Has a hacking cough.     Vitals:   12/20/17 1005 12/20/17 1006  BP:  102/80  Pulse:  88  SpO2: 100%    Gen: Pleasant, well-nourished, in no distress,  normal affect  ENT: No lesions,  mouth clear,  oropharynx clear, no postnasal drip  Neck: No JVD, no TMG, no carotid bruits  Lungs: No use of accessory muscles, no dullness to percussion, clear without rales or rhonchi  Cardiovascular: RRR, heart sounds normal, no murmur or gallops, no peripheral edema  Musculoskeletal: No deformities, no cyanosis or clubbing  Neuro: alert, non focal  Skin: Warm, no lesions or rashes  PULMONARY FUNCTON TEST 04/09/2010 04/28/2011 05/04/2012 06/06/2013  FVC 4.98 4.51 4.42 4.27  FEV1  3.68 3.2 3.24 3.1  FEV1/FVC 73.9 71 73.3 72.6  FVC  % Predicted 92 87 85 80  FEV % Predicted 90 88 90 78  FeF 25-75 2.66 2.01 2.32 4.09  FeF 25-75 % Predicted 3.32 3.29 3.25 5.04     ASSESSMENT/PLAN:  Asthma Overall stable from a purely pulmonary standpoint although note new evaluation for unexplained cardiomyopathy.  I do not believe we need to perform any pulmonary testing at this time.  He needs this maintenance annually.  Please keep your albuterol available to use 2 puffs if needed for shortness of breath, chest tightness, wheezing.  We will plan timing of repeat PFT's repeat chest imaging when we follow-up next time. Get your heart catheterization and follow-up with Dr. Einar Gip as planned. Follow with Dr Lamonte Sakai in 6 months or sooner if you have any problems  Allergic rhinitis Please continue Allegra once a day. Consider adding fluticasone nasal spray (Flonase), 2 sprays each nostril once a day to see if this helps with your nasal congestion and coughing.  Baltazar Apo, MD, PhD 12/20/2017, 10:35 AM [facility] [phone] or if no answer [phone]

## 2017-12-20 NOTE — Patient Instructions (Addendum)
Please keep your albuterol available to use 2 puffs if needed for shortness of breath, chest tightness, wheezing.  We will plan timing of repeat PFT's repeat chest imaging when we follow-up next time. Please continue Allegra once a day. Consider adding fluticasone nasal spray (Flonase), 2 sprays each nostril once a day to see if this helps with your nasal congestion and coughing. Get your heart catheterization and follow-up with Dr. Einar Gip as planned. Follow with Dr Lamonte Sakai in 6 months or sooner if you have any problems

## 2017-12-20 NOTE — Assessment & Plan Note (Signed)
Overall stable from a purely pulmonary standpoint although note new evaluation for unexplained cardiomyopathy.  I do not believe we need to perform any pulmonary testing at this time.  He needs this maintenance annually.  Please keep your albuterol available to use 2 puffs if needed for shortness of breath, chest tightness, wheezing.  We will plan timing of repeat PFT's repeat chest imaging when we follow-up next time. Get your heart catheterization and follow-up with Dr. Einar Gip as planned. Follow with Dr Lamonte Sakai in 6 months or sooner if you have any problems

## 2017-12-20 NOTE — Assessment & Plan Note (Signed)
Please continue Allegra once a day. Consider adding fluticasone nasal spray (Flonase), 2 sprays each nostril once a day to see if this helps with your nasal congestion and coughing.

## 2017-12-25 DIAGNOSIS — I5022 Chronic systolic (congestive) heart failure: Secondary | ICD-10-CM | POA: Diagnosis present

## 2017-12-25 NOTE — Progress Notes (Signed)
12/21/2017: Hemoglobin 12.3, hematocrit 36.7, CBC otherwise normal.  Creatinine 2.0, EGFR 34/39, potassium 5.2, BMP otherwise normal.  INR 1.2, prothrombin time 11.9.

## 2017-12-25 NOTE — H&P (Signed)
Franklin Jones 01/14/2018 1:00 PM Location: [facility] Patient #: [id number] DOB: Dec 25, 1950 Married / Language: Franklin Jones / Race: White Male   History of Present Illness Franklin Maine FNP-C; 12/16/2017 7:57 AM) Patient words: Last OV 12/07/2017; 1 week f/u for doe, echo results.  The patient is a 68 year old male who presents with chest pain. Patient was last seen by [place] 1 week ago as a self-referral for chest discomfort, shortness of breath, and fatigue. Patient was found to have clinical evidence of acute on chronic heart failure and underwent echocardiogram and now presents for follow-up. At his last office visit, he was started on Lasix 20 mg twice a day, carvedilol, valsartan, and statin therapy. He is tolerating medications well. He continues to have some shortness of breath; however, feels that his symptoms have significantly improved since starting the medications. Also reports leg edema has improved.  Does have history of asbestos exposure with abnormal CXR in 2004. He is followed by Franklin Jones for this as well as intermittent asthma. CT scans have remained stable; however, does have coronary atherosclerosis noted on last scan in 2018. He is also followed by Franklin Castle, NP with GI for management of GERD.  Denies any smoking history. He has been retired for the last 3 years. He has made significant changes to his diet and has lost 15 lbs in the last week.   Problem List/Past Medical Franklin Jones; 01/14/2018 12:57 PM) Acid reflux (K21.9)  Asbestosis (V77)  Laboratory examination (Z01.89)  12/14/2017: Creatinine 1.8, EGFR 38, potassium 5.5, BMP otherwise normal. BNP 726.1. 12/07/2017: Glucose 146, creatinine 1.7, EGFR 38/44, potassium 4.6, CMP otherwise normal. BNP 801.8. 07/12/2017: CBC normal. Creatinine 1.4, EGFR 51/59, potassium 4.8, CMP normal. Cholesterol 206, triglycerides 98, HDL 56, LDL 130. TSH 3.7. Dyspnea on exertion (R06.09)  CT of chest 06/22/2017: 1. No  evidence of interstitial lung disease. 2. Aortic atherosclerosis. Three-vessel coronary artery calcification. Echocardiogram 12/08/2017: Left ventricle cavity is moderately dilated. Mild concentric hypertrophy of the left ventricle. Severe decrease in global wall motion. Visual EF is <20%. Doppler evidence of grade III (restrictive) diastolic dysfunction, elevated LAP. Apical thrombus cannot be excluded without acoustic contrast use. Calculated EF 20%. Left atrial cavity is moderately dilated. Moderate (Grade II) mitral regurgitation. Moderate tricuspid regurgitation. Estimated pulmonary artery systolic pressure 52 mmHg. Mild pulmonic regurgitation. IVC is dilated with poor inspiration collapse consistent with elevated right atrial pressure. Left bundle branch block (I44.7)  EKG 12/07/2017: Sinus tachycardia at 103 bpm, normal axis, left atrial abnormality, left bundle branch block, no further analysis due to LBBB. Hyperlipidemia, group A (E78.00)   Allergies Franklin Jones; January 14, 2018 12:57 PM) Penicillins  Rash.  Family History Franklin Jones; 01-14-2018 12:57 PM) Mother  Deceased. at age 30 from chf, alzheimers, no heart attacks or strokes, no known cardiovascular conditions Father  Deceased. at age 63 from heart attack, no strokes Sister 1  older-deceased, no heart attacks or strokes, no known cardiovascular conditions Brother 2  older- 1 deceased, chf, no heart attacks or strokes, no known cardiovascular conditions  Social History Franklin Jones; 2018/01/14 12:57 PM) Current tobacco use  Former smoker. quit 35 yrs, 1 ppd Non Drinker/No Alcohol Use  Marital status  Married. Living Situation  Lives with spouse. Number of Children  3.  Past Surgical History Franklin Jones; 01-14-18 12:57 PM) Knee Replacement, Total [2013]: Finger Amp [2009]: Left. skin cancer removal [2014]: back of neck Vasectomy [1990]:  Medication History Franklin Jones; 01-14-2018 1:03  PM) Carvedilol (3.125MG  Tablet, 1 (one) Tablet Oral two times daily, Taken starting 12/07/2017) Active. Valsartan (80MG Tablet, 1 (one) Tablet Oral daily, Taken starting 12/07/2017) Active. Furosemide (20MG Tablet, 1 (one) Tablet Oral two times daily, Taken starting 12/07/2017) Active. (disregard 40 mg dose) Rosuvastatin Calcium (10MG Tablet, 1 (one) Tablet Oral daily, Taken starting 12/07/2017) Active. Ventolin HFA (108 (90 Base)MCG/ACT Aerosol Soln, Inhalation prn) Active. Pantoprazole Sodium (40MG Tablet DR, 1 Oral daily) Active. Fexofenadine HCl (180MG Tablet, 1 Oral daily) Active. Sildenafil Citrate (20MG Tablet, Oral prn) Active. Gaviscon-2 (Oral prn) Active. Medications Reconciled (verbally with pt; medication present)  Diagnostic Studies History Franklin Jones; 12/19/2017 8:34 AM) Echocardiogram  12/08/2017: Left ventricle cavity is moderately dilated. Mild concentric hypertrophy of the left ventricle. Severe decrease in global wall motion. Visual EF is <20%. Doppler evidence of grade III (restrictive) diastolic dysfunction, elevated LAP. Apical thrombus cannot be excluded without acoustic contrast use. Calculated EF 20%. Left atrial cavity is moderately dilated. Moderate (Grade II) mitral regurgitation. Moderate tricuspid regurgitation. Estimated pulmonary artery systolic pressure 52 mmHg. Mild pulmonic regurgitation. IVC is dilated with poor inspiration collapse consistent with elevated right atrial pressure.    Review of Systems Franklin Maine FNP-C; 12/16/2017 7:58 AM) General Not Present- Appetite Loss and Weight Gain. Respiratory Not Present- Chronic Cough and Wakes up from Sleep Wheezing or Short of Breath. Cardiovascular Present- Chest Pain (discomfort), Difficulty Breathing Lying Down (improved), Difficulty Breathing On Exertion (improved) and Edema (improved). Gastrointestinal Not Present- Black, Tarry Stool and Difficulty Swallowing. Musculoskeletal Not  Present- Decreased Range of Motion and Muscle Atrophy. Neurological Not Present- Attention Deficit. Psychiatric Not Present- Personality Changes and Suicidal Ideation. Endocrine Not Present- Cold Intolerance and Heat Intolerance. Hematology Not Present- Abnormal Bleeding. All other systems negative  Vitals Franklin Jones; Dec 19, 2017 1:05 PM) 12/19/2017 1:00 PM Weight: 242.19 lb Height: 74in Body Surface Area: 2.36 m Body Mass Index: 31.09 kg/m  Pulse: 100 (Regular)  P.OX: 96% (Room air) BP: 112/62 (Sitting, Left Arm, Standard)       Physical Exam Franklin Maine FNP-C; 12-19-17 1:25 PM) General Mental Status-Alert. General Appearance-Cooperative and Appears stated age. Build & Nutrition-Moderately built.  Head and Neck Thyroid Gland Characteristics - normal size and consistency and no palpable nodules.  Chest and Lung Exam Chest and lung exam reveals -quiet, even and easy respiratory effort with no use of accessory muscles, non-tender and on auscultation, normal breath sounds, no adventitious sounds.  Cardiovascular Cardiovascular examination reveals -carotid auscultation reveals no bruits, abdominal aorta auscultation reveals no bruits and no prominent pulsation, femoral artery auscultation bilaterally reveals normal pulses, no bruits, no thrills and normal pedal pulses bilaterally. Inspection Jugular vein - Right - No Distention. Auscultation Heart Sounds - S2 Physiologic splitting and S3.  Abdomen Inspection Contour - Generalized moderate distention. Palpation/Percussion Normal exam - Non Tender and No hepatosplenomegaly.  Peripheral Vascular Lower Extremity Palpation - Edema - Bilateral - 2+ Pitting edema.  Neurologic Neurologic evaluation reveals -alert and oriented x 3 with no impairment of recent or remote memory. Motor-Grossly intact without any focal deficits.  Musculoskeletal Global Assessment Left Lower Extremity - no  deformities, masses or tenderness, no known fractures. Right Lower Extremity - no deformities, masses or tenderness, no known fractures.   Results Franklin Maine FNP-C; 12/16/2017 7:59 AM) Labs  Name Value Range Date METABOLIC PANEL, BASIC (78469)   Collected: 12/13/2017 2:48 PM Glucose 129 mg/dL 65-99 mg/dL Collected: 12/13/2017 2:48 PM BUN 30 mg/dL 8-27 mg/dL Collected: 12/13/2017 2:48 PM Creatinine 1.80 mg/dL 0.76-1.27 mg/dL  Collected: 12/13/2017 2:48 PM eGFR If NonAfricn Am 38 mL/min/1.73 >59 mL/min/1.73 Collected: 12/13/2017 2:48 PM eGFR If Africn Am 44 mL/min/1.73 >59 mL/min/1.73 Collected: 12/13/2017 2:48 PM BUN/Creatinine Ratio 17 10-24 Collected: 12/13/2017 2:48 PM Sodium 139 mmol/L 134-144 mmol/L Collected: 12/13/2017 2:48 PM Potassium 5.5 mmol/L 3.5-5.2 mmol/L Collected: 12/13/2017 2:48 PM Chloride 100 mmol/L 96-106 mmol/L Collected: 12/13/2017 2:48 PM Carbon Dioxide, Total 24 mmol/L 20-29 mmol/L Collected: 12/13/2017 2:48 PM Calcium 9.1 mg/dL 8.6-10.2 mg/dL Collected: 12/13/2017 2:48 PM ASSAY, NATIURETIC PEPTIDE (93734)   Collected: 12/13/2017 2:48 PM B-Type Natriuretic Peptide 726.1 pg/mL 0.0-100.0 pg/mL Collected: 2/87/6811 5:72 PM METABOLIC PANEL, COMPREHENSIVE (80053)   Collected: 12/07/2017 3:32 PM Glucose 146 mg/dL 65-99 mg/dL Collected: 12/07/2017 3:32 PM BUN 23 mg/dL 8-27 mg/dL Collected: 12/07/2017 3:32 PM Creatinine 1.79 mg/dL 0.76-1.27 mg/dL Collected: 12/07/2017 3:32 PM eGFR If NonAfricn Am 38 mL/min/1.73 >59 mL/min/1.73 Collected: 12/07/2017 3:32 PM eGFR If Africn Am 44 mL/min/1.73 >59 mL/min/1.73 Collected: 12/07/2017 3:32 PM BUN/Creatinine Ratio 13 10-24 Collected: 12/07/2017 3:32 PM Sodium 141 mmol/L 134-144 mmol/L Collected: 12/07/2017 3:32 PM Potassium 4.6 mmol/L 3.5-5.2 mmol/L Collected: 12/07/2017  3:32 PM Chloride 105 mmol/L 96-106 mmol/L Collected: 12/07/2017 3:32 PM Carbon Dioxide, Total 22 mmol/L 20-29 mmol/L Collected: 12/07/2017 3:32 PM Calcium 9.0 mg/dL 8.6-10.2 mg/dL Collected: 12/07/2017 3:32 PM Protein, Total 7.0 g/dL 6.0-8.5 g/dL Collected: 12/07/2017 3:32 PM Albumin 3.9 g/dL 3.6-4.8 g/dL Collected: 12/07/2017 3:32 PM Globulin, Total 3.1 g/dL 1.5-4.5 g/dL Collected: 12/07/2017 3:32 PM A/G Ratio 1.3 1.2-2.2 Collected: 12/07/2017 3:32 PM Bilirubin, Total 0.9 mg/dL 0.0-1.2 mg/dL Collected: 12/07/2017 3:32 PM Alkaline Phosphatase 136 IU/L 39-117 IU/L Collected: 12/07/2017 3:32 PM AST (SGOT) 26 IU/L 0-40 IU/L Collected: 12/07/2017 3:32 PM ALT (SGPT) 32 IU/L 0-44 IU/L Collected: 12/07/2017 3:32 PM ASSAY, NATIURETIC PEPTIDE (62035)   Collected: 12/07/2017 3:32 PM B-Type Natriuretic Peptide 801.8 pg/mL 0.0-100.0 pg/mL Collected: 12/07/2017 3:32 PM  Procedures Name Value Date Echocardiography, transthoracic, real-time with image documentation (2D), includes M-mode recording, when performed, complete, with spectral Doppler echocardiography, and with color flow Doppler echocardiography (59741) Comments: Echocardiogram 12/08/2017: Left ventricle cavity is moderately dilated. Mild concentric hypertrophy of the left ventricle. Severe decrease in global wall motion. Visual EF is <20%. Doppler evidence of grade III (restrictive) diastolic dysfunction, elevated LAP. Apical thrombus cannot be excluded without acoustic contrast use. Calculated EF 20%. Left atrial cavity is moderately dilated. Moderate (Grade II) mitral regurgitation. Moderate tricuspid regurgitation. Estimated pulmonary artery systolic pressure 52 mmHg. Mild pulmonic regurgitation. IVC is dilated with poor inspiration collapse consistent with elevated right atrial pressure.  Performed: 12/08/2017 3:43 PM    Assessment & Plan Franklin Maine FNP-C; 12/16/2017 7:59 AM) Acute on chronic systolic heart failure, NYHA class 3 (I50.23) Story: Echocardiogram 12/08/2017: Left ventricle cavity is moderately dilated. Mild concentric hypertrophy of the left ventricle. Severe decrease in global wall motion. Visual EF is <20%. Doppler evidence of grade III (restrictive) diastolic dysfunction, elevated LAP. Apical thrombus cannot be excluded without acoustic contrast use. Calculated EF 20%. Left atrial cavity is moderately dilated. Moderate (Grade II) mitral regurgitation. Moderate tricuspid regurgitation. Estimated pulmonary artery systolic pressure 52 mmHg. Mild pulmonic regurgitation. IVC is dilated with poor inspiration collapse consistent with elevated right atrial pressure. Current Plans Changed Carvedilol 6.25MG, 1 (one) Tablet two times daily, #30, 30 days starting 12/15/2017, Ref. x1. Started Corlanor 5MG, 1 (one) Tablet twice a day, Mail Order #180, 90 days starting 12/15/2017, Ref. x3. Future Plans 12/20/2017: CBC & PLATELETS (AUTO) (63845) - one time 12/22/4678: METABOLIC PANEL, BASIC (32122) -  one time 12/20/2017: PT (PROTHROMBIN TIME) (13086) - one time Dyspnea on exertion (R06.09) Story: CT of chest 06/22/2017: 1. No evidence of interstitial lung disease. 2. Aortic atherosclerosis. Three-vessel coronary artery calcification.  Echocardiogram 12/08/2017: Left ventricle cavity is moderately dilated. Mild concentric hypertrophy of the left ventricle. Severe decrease in global wall motion. Visual EF is <20%. Doppler evidence of grade III (restrictive) diastolic dysfunction, elevated LAP. Apical thrombus cannot be excluded without acoustic contrast use. Calculated EF 20%. Left atrial cavity is moderately dilated. Moderate (Grade II) mitral regurgitation. Moderate tricuspid regurgitation. Estimated pulmonary artery systolic pressure 52 mmHg. Mild pulmonic regurgitation. IVC is dilated with poor inspiration collapse consistent with  elevated right atrial pressure. Future Plans 12/26/2017: Myocardial perfusion imaging, tomographic (SPECT) (including attenuation correction, qualitative or quantitative wall motion, ejection fraction by first pass or gated technique, additional quantification, when performed) - one time Left bundle branch block (I44.7) Story: EKG 12/07/2017: Sinus tachycardia at 103 bpm, normal axis, left atrial abnormality, left bundle branch block, no further analysis due to LBBB. Hyperlipidemia, group A (E78.00) Laboratory examination (V78.46) Story: 12/14/2017: Creatinine 1.8, EGFR 38, potassium 5.5, BMP otherwise normal. BNP 726.1.  12/07/2017: Glucose 146, creatinine 1.7, EGFR 38/44, potassium 4.6, CMP otherwise normal. BNP 801.8.  07/12/2017: CBC normal. Creatinine 1.4, EGFR 51/59, potassium 4.8, CMP normal. Cholesterol 206, triglycerides 98, HDL 56, LDL 130. TSH 3.7.  Note:. Recommendation:  Patient presents for follow-up after testing. I discussed test results with patient and his wife. Patient continues to be in full on heart failure despite medical therapy with tachycardia, shortness of breath, and gallop. I have recommended hospital admission or at least emergency room visit for IV diuresis; however, patient declines at this time as he states that he feels better and wishes to return home. He does have light improvement in symptoms since starting diuretic and beta blocker therapy.   Given his heart failure and known coronary disease by CT scan, we'll set him up for right and left heart catheterization for further evaluation. Schedule for cardiac catheterization, and possible angioplasty. We discussed regarding risks, benefits, alternatives to this including stress testing, CTA and continued medical therapy. Patient wants to proceed. Understands <1-2% risk of death, stroke, MI, urgent CABG, bleeding, infection, renal failure but not limited to these. I have canceled his previously scheduled stress  test as it would not change the course of therapy. I have increased his dose of carvedilol to 6.25 mg and do not feel I can further increase this due to borderline hypotension. I've also started him on Chorlanor 5 mg twice a day to improve his heart rate. Advised patient to continue to avoid strenuous activity until he can undergo further evaluation. Patient would also be a good candidate for galactic heart failure trial and will continue to discuss at his next follow-up. We'll see him after the procedure for reevaluation and further recommendations.  *I have discussed this case with Dr. Einar Gip and he personally examined the patient and participated in formulating the plan.*  CC: Dr. Jerene Bears; CC: Dr. Baltazar Apo; CC: Franklin Castle, NP    Signed by Franklin Maine, FNP-C (12/16/2017 8:00 AM)  Addendum 12/16/2017: Patient was admitted to the hospital overnight for IV diuresis even his shortness of breath and tachycardia.  Concern was that he may be in a decompensated low output state given his resting tachycardia.  However, his lactic acid is normal.  His creatinine has fluctuated between 1.7-2.0.  Patient diuresed over a liter of urine with just  one dose of IV Lasix 40 mg.  I'm discharging him on Lasix 20 mg p.o. twice daily.  I will asked him to continue carvedilol 3.125 mg twice daily until he finishes the current bottle, and then increasing to 6.25 mg twice daily in one to 2 weeks.  This will ensure that his compensatory tachycardia is not intubated.  In addition, given his protruding valsartan and upcoming cardiac catheterization procedure, I have stopped his valsartan.  I have asked him to start taking BiDil 20-30 7.5 mg one tablet 3 times daily.  He will pick up samples f from the same form are office.  Okay to start taking corlanor 5 mg twice daily.  I'll talk to him about sodium restriction diet. Patient and wife verbalize understanding.

## 2017-12-27 ENCOUNTER — Ambulatory Visit (HOSPITAL_COMMUNITY)
Admission: RE | Admit: 2017-12-27 | Discharge: 2017-12-27 | Disposition: A | Discharge status: Home / Self Care | Payer: Medicare Other | Source: Ambulatory Visit | Attending: Cardiology | Admitting: Cardiology

## 2017-12-27 ENCOUNTER — Encounter (HOSPITAL_COMMUNITY)
Admission: RE | Disposition: A | Discharge status: Home / Self Care | Payer: Self-pay | Source: Ambulatory Visit | Attending: Cardiology

## 2017-12-27 DIAGNOSIS — I447 Left bundle-branch block, unspecified: Secondary | ICD-10-CM | POA: Insufficient documentation

## 2017-12-27 DIAGNOSIS — I5041 Acute combined systolic (congestive) and diastolic (congestive) heart failure: Secondary | ICD-10-CM | POA: Diagnosis not present

## 2017-12-27 DIAGNOSIS — I700 Atherosclerosis of aorta: Secondary | ICD-10-CM | POA: Diagnosis not present

## 2017-12-27 DIAGNOSIS — Z77090 Contact with and (suspected) exposure to asbestos: Secondary | ICD-10-CM | POA: Insufficient documentation

## 2017-12-27 DIAGNOSIS — I2510 Atherosclerotic heart disease of native coronary artery without angina pectoris: Secondary | ICD-10-CM | POA: Insufficient documentation

## 2017-12-27 DIAGNOSIS — I2584 Coronary atherosclerosis due to calcified coronary lesion: Secondary | ICD-10-CM | POA: Diagnosis not present

## 2017-12-27 DIAGNOSIS — Z880 Allergy status to penicillin: Secondary | ICD-10-CM | POA: Insufficient documentation

## 2017-12-27 DIAGNOSIS — K219 Gastro-esophageal reflux disease without esophagitis: Secondary | ICD-10-CM | POA: Diagnosis not present

## 2017-12-27 DIAGNOSIS — I130 Hypertensive heart and chronic kidney disease with heart failure and stage 1 through stage 4 chronic kidney disease, or unspecified chronic kidney disease: Secondary | ICD-10-CM | POA: Insufficient documentation

## 2017-12-27 DIAGNOSIS — Z8249 Family history of ischemic heart disease and other diseases of the circulatory system: Secondary | ICD-10-CM | POA: Diagnosis not present

## 2017-12-27 DIAGNOSIS — N189 Chronic kidney disease, unspecified: Secondary | ICD-10-CM | POA: Insufficient documentation

## 2017-12-27 DIAGNOSIS — E785 Hyperlipidemia, unspecified: Secondary | ICD-10-CM | POA: Insufficient documentation

## 2017-12-27 DIAGNOSIS — I5022 Chronic systolic (congestive) heart failure: Secondary | ICD-10-CM | POA: Diagnosis present

## 2017-12-27 DIAGNOSIS — Z87891 Personal history of nicotine dependence: Secondary | ICD-10-CM | POA: Insufficient documentation

## 2017-12-27 DIAGNOSIS — I10 Essential (primary) hypertension: Secondary | ICD-10-CM | POA: Diagnosis not present

## 2017-12-27 HISTORY — PX: RIGHT/LEFT HEART CATH AND CORONARY ANGIOGRAPHY: CATH118266

## 2017-12-27 LAB — POCT I-STAT 3, VENOUS BLOOD GAS (G3P V)
ACID-BASE EXCESS: 1 mmol/L (ref 0.0–2.0)
Bicarbonate: 25.9 mmol/L (ref 20.0–28.0)
O2 SAT: 56 %
PCO2 VEN: 40.6 mmHg — AB (ref 44.0–60.0)
TCO2: 27 mmol/L (ref 22–32)
pH, Ven: 7.413 (ref 7.250–7.430)
pO2, Ven: 29 mmHg — CL (ref 32.0–45.0)

## 2017-12-27 LAB — POCT I-STAT 3, ART BLOOD GAS (G3+)
ACID-BASE EXCESS: 2 mmol/L (ref 0.0–2.0)
Bicarbonate: 26.2 mmol/L (ref 20.0–28.0)
O2 SAT: 94 %
PCO2 ART: 39.8 mmHg (ref 32.0–48.0)
PO2 ART: 70 mmHg — AB (ref 83.0–108.0)
TCO2: 27 mmol/L (ref 22–32)
pH, Arterial: 7.426 (ref 7.350–7.450)

## 2017-12-27 LAB — POCT ACTIVATED CLOTTING TIME
ACTIVATED CLOTTING TIME: 208 s
Activated Clotting Time: 208 seconds

## 2017-12-27 SURGERY — RIGHT/LEFT HEART CATH AND CORONARY ANGIOGRAPHY
Anesthesia: LOCAL

## 2017-12-27 MED ORDER — VERAPAMIL HCL 2.5 MG/ML IV SOLN
INTRA_ARTERIAL | Status: DC | PRN
  Administered 2017-12-27: 5 mL via INTRA_ARTERIAL

## 2017-12-27 MED ORDER — HEPARIN SODIUM (PORCINE) 1000 UNIT/ML IJ SOLN
INTRAMUSCULAR | Status: AC
  Filled 2017-12-27: qty 1

## 2017-12-27 MED ORDER — PANTOPRAZOLE SODIUM 40 MG PO TBEC: 40.0000 mg | DELAYED_RELEASE_TABLET | Freq: Every day | ORAL | Status: DC

## 2017-12-27 MED ORDER — ASPIRIN 81 MG PO CHEW
CHEWABLE_TABLET | ORAL | Status: AC
  Administered 2017-12-27: 81 mg via ORAL
  Filled 2017-12-27: qty 1

## 2017-12-27 MED ORDER — IOPAMIDOL (ISOVUE-370) INJECTION 76%
INTRAVENOUS | Status: AC
  Filled 2017-12-27: qty 50

## 2017-12-27 MED ORDER — HEPARIN (PORCINE) IN NACL 2-0.9 UNIT/ML-% IJ SOLN
INTRAMUSCULAR | Status: AC
  Filled 2017-12-27: qty 1000

## 2017-12-27 MED ORDER — SODIUM CHLORIDE 0.9% FLUSH: 3.0000 mL | Freq: Two times a day (BID) | INTRAVENOUS | Status: DC

## 2017-12-27 MED ORDER — ONDANSETRON HCL 4 MG/2ML IJ SOLN: 4.0000 mg | Freq: Four times a day (QID) | INTRAMUSCULAR | Status: DC | PRN

## 2017-12-27 MED ORDER — SODIUM CHLORIDE 0.9% FLUSH: 3.0000 mL | INTRAVENOUS | Status: DC | PRN

## 2017-12-27 MED ORDER — IOPAMIDOL (ISOVUE-370) INJECTION 76%
INTRAVENOUS | Status: AC
  Filled 2017-12-27: qty 100

## 2017-12-27 MED ORDER — LIDOCAINE HCL (PF) 1 % IJ SOLN
INTRAMUSCULAR | Status: AC
  Filled 2017-12-27: qty 30

## 2017-12-27 MED ORDER — ASPIRIN 81 MG PO CHEW
81.0000 mg | CHEWABLE_TABLET | ORAL | Status: AC
  Administered 2017-12-27: 81 mg via ORAL

## 2017-12-27 MED ORDER — HEPARIN (PORCINE) IN NACL 2-0.9 UNIT/ML-% IJ SOLN
INTRAMUSCULAR | Status: AC | PRN
  Administered 2017-12-27 (×3): 500 mL

## 2017-12-27 MED ORDER — SODIUM CHLORIDE 0.9 % IV SOLN
INTRAVENOUS | Status: AC
  Administered 2017-12-27: 08:00:00 via INTRAVENOUS

## 2017-12-27 MED ORDER — LIDOCAINE HCL (PF) 1 % IJ SOLN
INTRAMUSCULAR | Status: DC | PRN
  Administered 2017-12-27 (×2): 2 mL

## 2017-12-27 MED ORDER — MIDAZOLAM HCL 2 MG/2ML IJ SOLN
INTRAMUSCULAR | Status: AC
  Filled 2017-12-27: qty 2

## 2017-12-27 MED ORDER — FENTANYL CITRATE (PF) 100 MCG/2ML IJ SOLN
INTRAMUSCULAR | Status: DC | PRN
  Administered 2017-12-27: 25 ug via INTRAVENOUS

## 2017-12-27 MED ORDER — ACETAMINOPHEN 325 MG PO TABS: 650.0000 mg | ORAL_TABLET | ORAL | Status: DC | PRN

## 2017-12-27 MED ORDER — SODIUM CHLORIDE 0.9 % IV SOLN: 250.0000 mL | INTRAVENOUS | Status: DC | PRN

## 2017-12-27 MED ORDER — FENTANYL CITRATE (PF) 100 MCG/2ML IJ SOLN
INTRAMUSCULAR | Status: AC
  Filled 2017-12-27: qty 2

## 2017-12-27 MED ORDER — SODIUM CHLORIDE 0.9 % IV SOLN: INTRAVENOUS | Status: AC

## 2017-12-27 MED ORDER — NITROGLYCERIN 1 MG/10 ML FOR IR/CATH LAB
INTRA_ARTERIAL | Status: AC
  Filled 2017-12-27: qty 10

## 2017-12-27 MED ORDER — IOPAMIDOL (ISOVUE-370) INJECTION 76%
INTRAVENOUS | Status: DC | PRN
  Administered 2017-12-27: 120 mL via INTRA_ARTERIAL

## 2017-12-27 MED ORDER — MIDAZOLAM HCL 2 MG/2ML IJ SOLN
INTRAMUSCULAR | Status: DC | PRN
  Administered 2017-12-27: 1 mg via INTRAVENOUS

## 2017-12-27 MED ORDER — HEPARIN SODIUM (PORCINE) 1000 UNIT/ML IJ SOLN
INTRAMUSCULAR | Status: DC | PRN
  Administered 2017-12-27 (×2): 3000 [IU] via INTRAVENOUS
  Administered 2017-12-27: 5000 [IU] via INTRAVENOUS

## 2017-12-27 SURGICAL SUPPLY — 20 items
BAND CMPR LRG ZPHR (HEMOSTASIS) ×1
BAND ZEPHYR COMPRESS 30 LONG (HEMOSTASIS) ×1 IMPLANT
CATH BALLN WEDGE 5F 110CM (CATHETERS) ×1 IMPLANT
CATH OPTITORQUE TIG 4.0 5F (CATHETERS) ×1 IMPLANT
CATH VISTA GUIDE 6FR JR4 (CATHETERS) ×1 IMPLANT
GUIDEWIRE .025 260CM (WIRE) ×1 IMPLANT
GUIDEWIRE INQWIRE 1.5J.035X260 (WIRE) IMPLANT
INQWIRE 1.5J .035X260CM (WIRE) ×2
KIT ENCORE 26 ADVANTAGE (KITS) ×1 IMPLANT
KIT HEART LEFT (KITS) ×2 IMPLANT
KIT HEMO VALVE WATCHDOG (MISCELLANEOUS) ×1 IMPLANT
NDL PERC 21GX4CM (NEEDLE) IMPLANT
NEEDLE PERC 21GX4CM (NEEDLE) ×2 IMPLANT
PACK CARDIAC CATHETERIZATION (CUSTOM PROCEDURE TRAY) ×2 IMPLANT
SHEATH GLIDE SLENDER 4/5FR (SHEATH) ×1 IMPLANT
SHEATH RAIN RADIAL 21G 6FR (SHEATH) ×1 IMPLANT
TRANSDUCER W/STOPCOCK (MISCELLANEOUS) ×2 IMPLANT
TUBING CIL FLEX 10 FLL-RA (TUBING) ×2 IMPLANT
WIRE COUGAR XT STRL 190CM (WIRE) ×1 IMPLANT
WIRE EMERALD 3MM-J .025X260CM (WIRE) ×1 IMPLANT

## 2017-12-27 NOTE — Interval H&P Note (Signed)
History and Physical Interval Note:  12/27/2017 11:27 AM  Franklin Jones  has presented today for surgery, with the diagnosis of chf  The various methods of treatment have been discussed with the patient and family. After consideration of risks, benefits and other options for treatment, the patient has consented to  Procedure(s): RIGHT/LEFT HEART CATH AND CORONARY ANGIOGRAPHY (N/A) as a surgical intervention .  The patient's history has been reviewed, patient examined, no change in status, stable for surgery.  I have reviewed the patient's chart and labs.  Questions were answered to the patient's satisfaction.    2012 Appropriate Use Criteria for Diagnostic Catheterization Home / Select Test of Interest Indication for RHC Cardiomyopathies Cardiomyopathies (Right and Left Heart Catheterization OR Right Heart Catheterization Alone With/Wit  Cardiomyopathies  (Right and Left Heart Catheterization OR  Right Heart Catheterization Alone With/Without Left Ventriculography and Coronary Angiography)  Link Here: [email or web address] Indication:  1. Known or suspected cardiomyopathy with or without heart failure A (7) Indication: 93; Score 7    Aycen Porreca J Avina Eberle

## 2017-12-27 NOTE — Discharge Instructions (Signed)

## 2017-12-27 NOTE — Interval H&P Note (Signed)
History and Physical Interval Note:  12/27/2017 12:04 PM  Franklin Jones  has presented today for surgery, with the diagnosis of chf  The various methods of treatment have been discussed with the patient and family. After consideration of risks, benefits and other options for treatment, the patient has consented to  Procedure(s): RIGHT/LEFT HEART CATH AND CORONARY ANGIOGRAPHY (N/A) as a surgical intervention .  The patient's history has been reviewed, patient examined, no change in status, stable for surgery.  I have reviewed the patient's chart and labs.  Questions were answered to the patient's satisfaction.    2016/2017 Appropriate Use Criteria for Coronary Revascularization Clinical Presentation: Diabetes Mellitus? Symptom Status? S/P CABG? Antianginal Therapy (# of long-acting drugs)? Results of Non-invasive testing? FFR/iFR results in all diseased vessels? Patient undergoing renal transplant? Patient undergoing percutaneous valve procedure (TAVR, MitraClip, Others)? Symptom Status:  Ischemic Symptoms  Non-invasive Testing:  High risk  If no or indeterminate stress test, FFR/iFR results in all diseased vessels:  N/A  Diabetes Mellitus:  No  S/P CABG:  No  Antianginal therapy (number of long-acting drugs):  >=2  Patient undergoing renal transplant:  No  Patient undergoing percutaneous valve procedure:  No    newline 1 Vessel Disease PCI CABG  No proximal LAD involvement, No proximal left dominant LCX involvement A (8); Indication 2 M (6); Indication 2   Proximal left dominant LCX involvement A (8); Indication 5 A (8); Indication 5   Proximal LAD involvement A (8); Indication 5 A (8); Indication 5   newline 2 Vessel Disease  No proximal LAD involvement A (8); Indication 8 A (7); Indication 8   Proximal LAD involvement A (8); Indication 11 A (8); Indication 11   newline 3 Vessel Disease  Low disease complexity (e.g., focal stenoses, SYNTAX <=22) A (8); Indication 17 A (8);  Indication 17   Intermediate or high disease complexity (e.g., SYNTAX >=23) M (6); Indication 21 A (9); Indication 21   newline Left Main Disease  Isolated LMCA disease: ostial or midshaft A (7); Indication 24 A (9); Indication 24   Isolated LMCA disease: bifurcation involvement M (6); Indication 25 A (9); Indication 25   LMCA ostial or midshaft, concurrent low disease burden multivessel disease (e.g., 1-2 additional focal stenoses, SYNTAX <=22) A (7); Indication 26 A (9); Indication 26   LMCA ostial or midshaft, concurrent intermediate or high disease burden multivessel disease (e.g., 1-2 additional bifurcation stenoses, long stenoses, SYNTAX >=23) M (4); Indication 27 A (9); Indication 27   LMCA bifurcation involvement, concurrent low disease burden multivessel disease (e.g., 1-2 additional focal stenoses, SYNTAX <=22) M (6); Indication 28 A (9); Indication 28   LMCA bifurcation involvement, concurrent intermediate or high disease burden multivessel disease (e.g., 1-2 additional bifurcation stenoses, long stenoses, SYNTAX >=23) R (3); Indication 29 A (9); Indication [address]           Adrian Prows

## 2017-12-28 ENCOUNTER — Encounter (HOSPITAL_COMMUNITY): Payer: Self-pay | Admitting: Cardiology

## 2018-01-04 DIAGNOSIS — I447 Left bundle-branch block, unspecified: Secondary | ICD-10-CM | POA: Diagnosis not present

## 2018-01-04 DIAGNOSIS — R0609 Other forms of dyspnea: Secondary | ICD-10-CM | POA: Diagnosis not present

## 2018-01-04 DIAGNOSIS — I25118 Atherosclerotic heart disease of native coronary artery with other forms of angina pectoris: Secondary | ICD-10-CM | POA: Diagnosis not present

## 2018-01-04 DIAGNOSIS — I5023 Acute on chronic systolic (congestive) heart failure: Secondary | ICD-10-CM | POA: Diagnosis not present

## 2018-01-16 DIAGNOSIS — R0609 Other forms of dyspnea: Secondary | ICD-10-CM | POA: Diagnosis not present

## 2018-01-16 DIAGNOSIS — I5023 Acute on chronic systolic (congestive) heart failure: Secondary | ICD-10-CM | POA: Diagnosis not present

## 2018-01-16 DIAGNOSIS — I447 Left bundle-branch block, unspecified: Secondary | ICD-10-CM | POA: Diagnosis not present

## 2018-01-16 DIAGNOSIS — I25118 Atherosclerotic heart disease of native coronary artery with other forms of angina pectoris: Secondary | ICD-10-CM | POA: Diagnosis not present

## 2018-01-24 DIAGNOSIS — R0609 Other forms of dyspnea: Secondary | ICD-10-CM | POA: Diagnosis not present

## 2018-01-24 DIAGNOSIS — I25118 Atherosclerotic heart disease of native coronary artery with other forms of angina pectoris: Secondary | ICD-10-CM | POA: Diagnosis not present

## 2018-01-24 DIAGNOSIS — E7800 Pure hypercholesterolemia, unspecified: Secondary | ICD-10-CM | POA: Diagnosis not present

## 2018-01-31 DIAGNOSIS — I447 Left bundle-branch block, unspecified: Secondary | ICD-10-CM

## 2018-01-31 HISTORY — DX: Left bundle-branch block, unspecified: I44.7

## 2018-02-01 DIAGNOSIS — R0609 Other forms of dyspnea: Secondary | ICD-10-CM | POA: Diagnosis not present

## 2018-02-01 DIAGNOSIS — I25118 Atherosclerotic heart disease of native coronary artery with other forms of angina pectoris: Secondary | ICD-10-CM | POA: Diagnosis not present

## 2018-02-01 DIAGNOSIS — I5042 Chronic combined systolic (congestive) and diastolic (congestive) heart failure: Secondary | ICD-10-CM | POA: Diagnosis not present

## 2018-02-01 DIAGNOSIS — I447 Left bundle-branch block, unspecified: Secondary | ICD-10-CM | POA: Diagnosis not present

## 2018-02-08 ENCOUNTER — Telehealth: Payer: Self-pay | Admitting: Cardiology

## 2018-02-08 NOTE — Telephone Encounter (Signed)
Received records from [facility] on 02/08/18, Appt 03/29/18 @ 11:40 am. [place]

## 2018-03-07 ENCOUNTER — Encounter: Payer: Self-pay | Admitting: Cardiology

## 2018-03-09 DIAGNOSIS — R002 Palpitations: Secondary | ICD-10-CM | POA: Diagnosis not present

## 2018-03-09 DIAGNOSIS — I5042 Chronic combined systolic (congestive) and diastolic (congestive) heart failure: Secondary | ICD-10-CM | POA: Diagnosis not present

## 2018-03-09 DIAGNOSIS — I25118 Atherosclerotic heart disease of native coronary artery with other forms of angina pectoris: Secondary | ICD-10-CM | POA: Diagnosis not present

## 2018-03-09 DIAGNOSIS — R0609 Other forms of dyspnea: Secondary | ICD-10-CM | POA: Diagnosis not present

## 2018-03-22 DIAGNOSIS — I5023 Acute on chronic systolic (congestive) heart failure: Secondary | ICD-10-CM | POA: Diagnosis not present

## 2018-03-25 NOTE — Progress Notes (Signed)
Cardiology Office Note   Date:  03/29/2018   ID:  Franklin Jones, DOB Feb 28, 1951, MRN [id number]  PCP:  Glenda Chroman, MD  Cardiologist:  Adrian Prows MD  Chief Complaint  Patient presents with  . Shortness of Breath      History of Present Illness: Franklin Jones is a 67 y.o. male who is seen at the request of Dr. Einar Gip for consideration of CTO PCI. He was initially admitted to the hospital in February 2019 with acute CHF. EF was 20%. He had a LBBB and elevated BNP > 700. Creatinine was elevated to 2.07. He was treated with Coreg, Bidil and IV lasix. ARB held due to AKI. CT of the chest did show 3 vessel coronary calcification.   He underwent right and left heart cath on March 12 by Dr. Einar Gip. This demonstrated moderate pulmonary HTN with PCWP of 20 mm Hg. He had occlusion of the LAD that filled by right to left collaterals. The PL branch of the RCA was also occluded with collaterals. Attempt was made to cross the PL branch with a wire but was unsuccessful. Since then medical therapy has been optimized by Dr. Einar Gip and he is seen to consider PCI.   On my evaluation today the patient notes occasional indigestion which is his anginal symptom. He does note some dyspnea with walking 1/2 mile or going up stairs. He is wearing a Lifevest. No palpitations. He is tolerating medication well. Patient reports he had a follow up Echo last week. Results unknown.     Past Medical History:  Diagnosis Date  . Allergic rhinitis   . Arthritis   . Degenerative arthritis of knee 11/19/2011  . GERD (gastroesophageal reflux disease)   . History of asbestos exposure    followed by Dr West Carbo- states stable years- last CT chest 7/12 in Epic  . Hypertension   . Shortness of breath    SOME SOB BUT CAN CLIMB FLIGHT OF STAIRS    Past Surgical History:  Procedure Laterality Date  . COLONOSCOPY    . COLONOSCOPY N/A 08/22/2014   Procedure: COLONOSCOPY;  Surgeon: Rogene Houston, MD;  Location: [facility];   Service: Endoscopy;  Laterality: N/A;  830  . 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 KNEE ARTHROPLASTY  11/19/2011   Procedure: COMPUTER ASSISTED TOTAL KNEE ARTHROPLASTY;  Surgeon: Mcarthur Rossetti, MD;  Location: [facility];  Service: Orthopedics;  Laterality: Right;  Right Total Knee Arthroplasty  . left finger amputation  2009  . MASS EXCISION Left 03/12/2014   Procedure: EXCISION LEFT NECK SKIN MASS;  Surgeon: Gayland Curry, MD;  Location: [facility];  Service: General;  Laterality: Left;  . RIGHT/LEFT HEART CATH AND CORONARY ANGIOGRAPHY N/A 12/27/2017   Procedure: RIGHT/LEFT HEART CATH AND CORONARY ANGIOGRAPHY;  Surgeon: Adrian Prows, MD;  Location: [facility];  Service: Cardiovascular;  Laterality: N/A;  . TOTAL KNEE ARTHROPLASTY  08/18/2012   Procedure: TOTAL KNEE ARTHROPLASTY;  Surgeon: Mcarthur Rossetti, MD;  Location: [facility];  Service: Orthopedics;  Laterality: Left;  Left Total Knee Arthroplasty     Current Outpatient Medications  Medication Sig Dispense Refill  . albuterol (PROVENTIL HFA;VENTOLIN HFA) 108 (90 Base) MCG/ACT inhaler Inhale 2 puffs into the lungs every 4 (four) hours as needed for wheezing or shortness of breath. 1 Inhaler 5  . aluminum hydroxide-magnesium carbonate (GAVISCON) 95-358 MG/15ML SUSP Take by mouth as  directed.    . carvedilol (COREG) 3.125 MG tablet Take 3.125 mg by mouth 2 (two) times daily.    . fexofenadine (ALLEGRA) 180 MG tablet Take 180 mg by mouth daily.    . furosemide (LASIX) 20 MG tablet Take 20 mg by mouth 2 (two) times daily.    . isosorbide-hydrALAZINE (BIDIL) 20-37.5 MG tablet Take 1 tablet by mouth 3 (three) times daily.    . pantoprazole (PROTONIX) 40 MG tablet Take 1 tablet (40 mg total) by mouth daily.    . potassium chloride (K-DUR) 10 MEQ tablet Take 10 mEq by mouth daily.    . rosuvastatin (CRESTOR) 10 MG tablet Take 10 mg by mouth daily.    [name] Kitchen aspirin EC 81 MG tablet Take 1  tablet (81 mg total) by mouth daily. 90 tablet 3  . clopidogrel (PLAVIX) 75 MG tablet Take 1 tablet (75 mg total) by mouth daily. 90 tablet 3   No current facility-administered medications for this visit.     Allergies:   Penicillins    Social History:  The patient  reports that he quit smoking about 32 years ago. His smoking use included cigarettes. He has a 20.00 pack-year smoking history. He has never used smokeless tobacco. He reports that he drinks about 5.4 oz of alcohol per week. He reports that he does not use drugs.   Family History:  The patient's family history includes Allergies in his mother; Heart disease in his father; Lung cancer in his brother.    ROS:  Please see the history of present illness.   Otherwise, review of systems are positive for none.   All other systems are reviewed and negative.    PHYSICAL EXAM: VS:  BP 120/78   Pulse 62   Ht 6\' 2"  (1.88 m)   Wt 228 lb 3.2 oz (103.5 kg)   SpO2 93%   BMI 29.30 kg/m  , BMI Body mass index is 29.3 kg/m. GEN: Well nourished, well developed, in no acute distress  HEENT: normal  Neck: no JVD, carotid bruits, or masses Cardiac: RRR; no murmurs, rubs, or gallops,no edema  Respiratory:  clear to auscultation bilaterally, normal work of breathing GI: soft, nontender, nondistended, + BS MS: no deformity or atrophy  Skin: warm and dry, no rash Neuro:  Strength and sensation are intact Psych: euthymic mood, full affect   EKG:  EKG is not ordered today. The ekg ordered today demonstrates N/A. Ecg done 12/15/17 showed NSR with LBBB. I have personally reviewed and interpreted this study.    Recent Labs: 12/15/2017: B Natriuretic Peptide 722.6 12/16/2017: BUN 23; Creatinine, Ser 1.91; Hemoglobin 11.3; Platelets 230; Potassium 3.7; Sodium 135    Lipid Panel No results found for: CHOL, TRIG, HDL, CHOLHDL, VLDL, LDLCALC, LDLDIRECT    Wt Readings from Last 3 Encounters:  03/29/18 228 lb 3.2 oz (103.5 kg)  12/27/17 230 lb  (104.3 kg)  12/16/17 239 lb 4.8 oz (108.5 kg)      Other studies Reviewed: Additional studies/ records that were reviewed today include:   Cardiac cath 12/27/17: Procedures   RIGHT/LEFT HEART CATH AND CORONARY ANGIOGRAPHY  Conclusion   Coronary angiogram 12/27/2017: Moderate to markedly elevated LVEDP at 21 mmHg.  Right dominant circulation, early bifurcation of PDA and PL branch.  Coronary calcification evident, mild disease in the PDA however the PL branch is occluded at its origin.  PDA gives collaterals to the occluded LAD.  Left main is normal and calcified.  LAD is mildly diffusely  calcified with occlusion after the origin of SP-1.  Distally it is collateralized by the right coronary artery.  Circumflex is large and has mild disease.  Recommendation:  I probed the RCA stenosis after engaging the RCA with JR4  29f catheter with Couger and also Versaturn guide wire, but the lesion appeared to be tough although short occlusion. Hence I decided to abort the procedure in view of elevated LVEDP and chronic renal insufficiency and will consider Impella assisted high risk intervention vs CABG. Will optimize medically first.  He will be discharged home today with outpatient follow-up.  Indications   Acute combined systolic and diastolic heart failure ([place]) [I50.41 (ICD-10-CM)]  Procedural Details/Technique   Technical Details Procedure performed:  Left heart catheterization including hemodynamic monitoring of the left ventricle, LV gram, selective right and left coronary arteriography. Right heart catheterization and cannulation of cardiac output and cardiac index by Fick.  Indication: SEVERN GODDARD is a 67 y.o. male with history of hypertension, hyperlipidemia, who presents with acute systolic heart failure and OP evaluation revealed severe LV systolic dysfunction by echo. Hence is brought to the cardiac catheterization lab to evaluate the coronary anatomy for definitive diagnosis of CAD.  Right heart cath done to evaluate CO and CI.  Right AC vein access for right heart and Right radial access for left heart catheterization was utilized for performing the procedure.   Technique:   A 5 French sheath introduced into right right AC vein for access under fluroscopic guidance. A 5 French Swan-Ganz catheter was advanced with balloon inflated under fluoroscopic guidance into first the right atrium followed by the right ventricle and into the pulmonary artery to pulmonary artery wedge position. Hemodynamics were obtained in a locations. 0.25" glide wire used to advance the catheter. After hemodynamics were completed, samples were taken for SaO2% measurement to be used in Fick Calculation of cardiac output and cardiac index. The catheter was then pulled back the balloon down and then completely out of the body. Hemostasis obtained by manual pressure over the access site.  Under sterile precautions using a 6 French right radial arterial access, a 6 French sheath was introduced into the right radial artery. A 5 [place] Tig 4 catheter was advanced into the ascending aorta selective right coronary artery and left coronary artery was cannulated and angiography was performed in multiple views. The catheter was pulled back Out of the body over exchange length J-wire. Same catheter was used to perform LV Hemodynamics. Catheter exchanged out of the body over J-Wire. NO immediate complications noted. Patient tolerated the procedure well. Contrast used: 120 mL.   Estimated blood loss <50 mL.  During this procedure the patient was administered the following to achieve and maintain moderate conscious sedation: Versed 1 mg, Fentanyl 25 mcg, while the patient's heart rate, blood pressure, and oxygen saturation were continuously monitored. The period of conscious sedation was 46 minutes, of which I was present face-to-face 100% of this time.  Coronary Findings   Diagnostic  Dominance: Right  Left Anterior  Descending  Collaterals  Dist LAD filled by collaterals from Dist RCA.    Prox LAD to Mid LAD lesion 100% stenosed  Prox LAD to Mid LAD lesion is 100% stenosed. The lesion is chronically occluded.  Third Septal Branch  Collaterals  Ost 3rd Sept filled by collaterals from Post Atrio.    Right Coronary Artery  Acute Marginal Branch  Acute Mrg lesion 100% stenosed  Acute Mrg lesion is 100% stenosed. The lesion is chronically  occluded.  Intervention   No interventions have been documented.  Coronary Diagrams   Diagnostic Diagram       Implants    No implant documentation for this case.  MERGE Images   Show images for CARDIAC CATHETERIZATION   Link to Procedure Log   Procedure Log    Hemo Data    Most Recent Value  Fick Cardiac Output 4.57 L/min  Fick Cardiac Output Index 1.99 (L/min)/BSA  RA A Wave 10 mmHg  RA V Wave 5 mmHg  RA Mean 5 mmHg  RV Systolic Pressure 46 mmHg  RV Diastolic Pressure 2 mmHg  RV EDP 10 mmHg  PA Systolic Pressure 53 mmHg  PA Diastolic Pressure 29 mmHg  PA Mean 39 mmHg  PW A Wave 22 mmHg  PW V Wave 21 mmHg  PW Mean 20 mmHg  AO Systolic Pressure 86 mmHg  AO Diastolic Pressure 61 mmHg  AO Mean 72 mmHg  LV Systolic Pressure 86 mmHg  LV Diastolic Pressure 8 mmHg  LV EDP 21 mmHg  Arterial Occlusion Pressure Extended Systolic Pressure 83 mmHg  Arterial Occlusion Pressure Extended Diastolic Pressure 56 mmHg  Arterial Occlusion Pressure Extended Mean Pressure 66 mmHg  Left Ventricular Apex Extended Systolic Pressure 85 mmHg  Left Ventricular Apex Extended Diastolic Pressure 6 mmHg  Left Ventricular Apex Extended EDP Pressure 17 mmHg  QP/QS 1  TPVR Index 19.63 HRUI  TSVR Index 36.23 HRUI  PVR SVR Ratio 0.28  TPVR/TSVR Ratio 0.54     ASSESSMENT AND PLAN:  1.  CAD with ischemic CM. EF 20%. Class 2 symptoms on optimal medical therapy. I personally reviewed his angiogram with him and his wife. He has CTO of the mid LAD which is a moderate  sized vessel. The RCA occlusion appears to be a small branch. We discussed options for revascularization. I think CTO PCI of the LAD is reasonable to help with his symptoms and to improve LV function. I would not attempt the RCA occlusion since this is a small branch and there is a risk of compromise of the larger branch that arises prior to the occlusion. The procedure was reviewed at length. The procedure and risks were reviewed including but not limited to death, myocardial infarction, perforation, stroke, arrythmias, bleeding, transfusion, emergency surgery, dye allergy, or renal dysfunction. The patient voices understanding and is agreeable to proceed. Will start ASA 81 mg daily and Plavix 75 mg daily. Procedure planned for Wednesday June 19. 2. Ischemic CM with chronic systolic CHF. On Bidil and Coreg. Does not appear to be volume overloaded. Unable to take ACEi, ARB, Entresto due to CKD 3. CKD. Stage 3. Last creatinine 1.8. Will need hydration prior to PCI. 4. Hypercholesterolemia. On statin. Followed by Dr. Einar Gip.  5. HTN controlled.   Current medicines are reviewed at length with the patient today.  The patient does not have concerns regarding medicines.  The following changes have been made:  See above  Labs/ tests ordered today include:   Orders Placed This Encounter  Procedures  . Basic metabolic panel  . CBC w/Diff/Platelet     Signed, Ebony Rickel [place], MD  03/29/2018 1:44 PM    [facility] [address], [place], [place], [address] Phone [phone], Fax [phone]

## 2018-03-25 NOTE — H&P (View-Only) (Signed)
Cardiology Office Note   Date:  03/29/2018   ID:  Franklin Jones, DOB 05-Apr-1951, MRN [id number]  PCP:  Glenda Chroman, MD  Cardiologist:  Adrian Prows MD  Chief Complaint  Patient presents with  . Shortness of Breath      History of Present Illness: Franklin Jones is a 67 y.o. male who is seen at the request of Dr. Einar Gip for consideration of CTO PCI. He was initially admitted to the hospital in February 2019 with acute CHF. EF was 20%. He had a LBBB and elevated BNP > 700. Creatinine was elevated to 2.07. He was treated with Coreg, Bidil and IV lasix. ARB held due to AKI. CT of the chest did show 3 vessel coronary calcification.   He underwent right and left heart cath on March 12 by Dr. Einar Gip. This demonstrated moderate pulmonary HTN with PCWP of 20 mm Hg. He had occlusion of the LAD that filled by right to left collaterals. The PL branch of the RCA was also occluded with collaterals. Attempt was made to cross the PL branch with a wire but was unsuccessful. Since then medical therapy has been optimized by Dr. Einar Gip and he is seen to consider PCI.   On my evaluation today the patient notes occasional indigestion which is his anginal symptom. He does note some dyspnea with walking 1/2 mile or going up stairs. He is wearing a Lifevest. No palpitations. He is tolerating medication well. Patient reports he had a follow up Echo last week. Results unknown.     Past Medical History:  Diagnosis Date  . Allergic rhinitis   . Arthritis   . Degenerative arthritis of knee 11/19/2011  . GERD (gastroesophageal reflux disease)   . History of asbestos exposure    followed by Dr West Carbo- states stable years- last CT chest 7/12 in Epic  . Hypertension   . Shortness of breath    SOME SOB BUT CAN CLIMB FLIGHT OF STAIRS    Past Surgical History:  Procedure Laterality Date  . COLONOSCOPY    . COLONOSCOPY N/A 08/22/2014   Procedure: COLONOSCOPY;  Surgeon: Rogene Houston, MD;  Location: [facility];   Service: Endoscopy;  Laterality: N/A;  830  . 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 KNEE ARTHROPLASTY  11/19/2011   Procedure: COMPUTER ASSISTED TOTAL KNEE ARTHROPLASTY;  Surgeon: Mcarthur Rossetti, MD;  Location: [facility];  Service: Orthopedics;  Laterality: Right;  Right Total Knee Arthroplasty  . left finger amputation  2009  . MASS EXCISION Left 03/12/2014   Procedure: EXCISION LEFT NECK SKIN MASS;  Surgeon: Gayland Curry, MD;  Location: [facility];  Service: General;  Laterality: Left;  . RIGHT/LEFT HEART CATH AND CORONARY ANGIOGRAPHY N/A 12/27/2017   Procedure: RIGHT/LEFT HEART CATH AND CORONARY ANGIOGRAPHY;  Surgeon: Adrian Prows, MD;  Location: [facility];  Service: Cardiovascular;  Laterality: N/A;  . TOTAL KNEE ARTHROPLASTY  08/18/2012   Procedure: TOTAL KNEE ARTHROPLASTY;  Surgeon: Mcarthur Rossetti, MD;  Location: [facility];  Service: Orthopedics;  Laterality: Left;  Left Total Knee Arthroplasty     Current Outpatient Medications  Medication Sig Dispense Refill  . albuterol (PROVENTIL HFA;VENTOLIN HFA) 108 (90 Base) MCG/ACT inhaler Inhale 2 puffs into the lungs every 4 (four) hours as needed for wheezing or shortness of breath. 1 Inhaler 5  . aluminum hydroxide-magnesium carbonate (GAVISCON) 95-358 MG/15ML SUSP Take by mouth as  directed.    . carvedilol (COREG) 3.125 MG tablet Take 3.125 mg by mouth 2 (two) times daily.    . fexofenadine (ALLEGRA) 180 MG tablet Take 180 mg by mouth daily.    . furosemide (LASIX) 20 MG tablet Take 20 mg by mouth 2 (two) times daily.    . isosorbide-hydrALAZINE (BIDIL) 20-37.5 MG tablet Take 1 tablet by mouth 3 (three) times daily.    . pantoprazole (PROTONIX) 40 MG tablet Take 1 tablet (40 mg total) by mouth daily.    . potassium chloride (K-DUR) 10 MEQ tablet Take 10 mEq by mouth daily.    . rosuvastatin (CRESTOR) 10 MG tablet Take 10 mg by mouth daily.    [name] Kitchen aspirin EC 81 MG tablet Take 1  tablet (81 mg total) by mouth daily. 90 tablet 3  . clopidogrel (PLAVIX) 75 MG tablet Take 1 tablet (75 mg total) by mouth daily. 90 tablet 3   No current facility-administered medications for this visit.     Allergies:   Penicillins    Social History:  The patient  reports that he quit smoking about 32 years ago. His smoking use included cigarettes. He has a 20.00 pack-year smoking history. He has never used smokeless tobacco. He reports that he drinks about 5.4 oz of alcohol per week. He reports that he does not use drugs.   Family History:  The patient's family history includes Allergies in his mother; Heart disease in his father; Lung cancer in his brother.    ROS:  Please see the history of present illness.   Otherwise, review of systems are positive for none.   All other systems are reviewed and negative.    PHYSICAL EXAM: VS:  BP 120/78   Pulse 62   Ht 6\' 2"  (1.88 m)   Wt 228 lb 3.2 oz (103.5 kg)   SpO2 93%   BMI 29.30 kg/m  , BMI Body mass index is 29.3 kg/m. GEN: Well nourished, well developed, in no acute distress  HEENT: normal  Neck: no JVD, carotid bruits, or masses Cardiac: RRR; no murmurs, rubs, or gallops,no edema  Respiratory:  clear to auscultation bilaterally, normal work of breathing GI: soft, nontender, nondistended, + BS MS: no deformity or atrophy  Skin: warm and dry, no rash Neuro:  Strength and sensation are intact Psych: euthymic mood, full affect   EKG:  EKG is not ordered today. The ekg ordered today demonstrates N/A. Ecg done 12/15/17 showed NSR with LBBB. I have personally reviewed and interpreted this study.    Recent Labs: 12/15/2017: B Natriuretic Peptide 722.6 12/16/2017: BUN 23; Creatinine, Ser 1.91; Hemoglobin 11.3; Platelets 230; Potassium 3.7; Sodium 135    Lipid Panel No results found for: CHOL, TRIG, HDL, CHOLHDL, VLDL, LDLCALC, LDLDIRECT    Wt Readings from Last 3 Encounters:  03/29/18 228 lb 3.2 oz (103.5 kg)  12/27/17 230 lb  (104.3 kg)  12/16/17 239 lb 4.8 oz (108.5 kg)      Other studies Reviewed: Additional studies/ records that were reviewed today include:   Cardiac cath 12/27/17: Procedures   RIGHT/LEFT HEART CATH AND CORONARY ANGIOGRAPHY  Conclusion   Coronary angiogram 12/27/2017: Moderate to markedly elevated LVEDP at 21 mmHg.  Right dominant circulation, early bifurcation of PDA and PL branch.  Coronary calcification evident, mild disease in the PDA however the PL branch is occluded at its origin.  PDA gives collaterals to the occluded LAD.  Left main is normal and calcified.  LAD is mildly diffusely  calcified with occlusion after the origin of SP-1.  Distally it is collateralized by the right coronary artery.  Circumflex is large and has mild disease.  Recommendation:  I probed the RCA stenosis after engaging the RCA with JR4  4f catheter with Couger and also Versaturn guide wire, but the lesion appeared to be tough although short occlusion. Hence I decided to abort the procedure in view of elevated LVEDP and chronic renal insufficiency and will consider Impella assisted high risk intervention vs CABG. Will optimize medically first.  He will be discharged home today with outpatient follow-up.  Indications   Acute combined systolic and diastolic heart failure ([place]) [I50.41 (ICD-10-CM)]  Procedural Details/Technique   Technical Details Procedure performed:  Left heart catheterization including hemodynamic monitoring of the left ventricle, LV gram, selective right and left coronary arteriography. Right heart catheterization and cannulation of cardiac output and cardiac index by Fick.  Indication: Franklin Jones is a 67 y.o. male with history of hypertension, hyperlipidemia, who presents with acute systolic heart failure and OP evaluation revealed severe LV systolic dysfunction by echo. Hence is brought to the cardiac catheterization lab to evaluate the coronary anatomy for definitive diagnosis of CAD.  Right heart cath done to evaluate CO and CI.  Right AC vein access for right heart and Right radial access for left heart catheterization was utilized for performing the procedure.   Technique:   A 5 French sheath introduced into right right AC vein for access under fluroscopic guidance. A 5 French Swan-Ganz catheter was advanced with balloon inflated under fluoroscopic guidance into first the right atrium followed by the right ventricle and into the pulmonary artery to pulmonary artery wedge position. Hemodynamics were obtained in a locations. 0.25" glide wire used to advance the catheter. After hemodynamics were completed, samples were taken for SaO2% measurement to be used in Fick Calculation of cardiac output and cardiac index. The catheter was then pulled back the balloon down and then completely out of the body. Hemostasis obtained by manual pressure over the access site.  Under sterile precautions using a 6 French right radial arterial access, a 6 French sheath was introduced into the right radial artery. A 5 [place] Tig 4 catheter was advanced into the ascending aorta selective right coronary artery and left coronary artery was cannulated and angiography was performed in multiple views. The catheter was pulled back Out of the body over exchange length J-wire. Same catheter was used to perform LV Hemodynamics. Catheter exchanged out of the body over J-Wire. NO immediate complications noted. Patient tolerated the procedure well. Contrast used: 120 mL.   Estimated blood loss <50 mL.  During this procedure the patient was administered the following to achieve and maintain moderate conscious sedation: Versed 1 mg, Fentanyl 25 mcg, while the patient's heart rate, blood pressure, and oxygen saturation were continuously monitored. The period of conscious sedation was 46 minutes, of which I was present face-to-face 100% of this time.  Coronary Findings   Diagnostic  Dominance: Right  Left Anterior  Descending  Collaterals  Dist LAD filled by collaterals from Dist RCA.    Prox LAD to Mid LAD lesion 100% stenosed  Prox LAD to Mid LAD lesion is 100% stenosed. The lesion is chronically occluded.  Third Septal Branch  Collaterals  Ost 3rd Sept filled by collaterals from Post Atrio.    Right Coronary Artery  Acute Marginal Branch  Acute Mrg lesion 100% stenosed  Acute Mrg lesion is 100% stenosed. The lesion is chronically  occluded.  Intervention   No interventions have been documented.  Coronary Diagrams   Diagnostic Diagram       Implants    No implant documentation for this case.  MERGE Images   Show images for CARDIAC CATHETERIZATION   Link to Procedure Log   Procedure Log    Hemo Data    Most Recent Value  Fick Cardiac Output 4.57 L/min  Fick Cardiac Output Index 1.99 (L/min)/BSA  RA A Wave 10 mmHg  RA V Wave 5 mmHg  RA Mean 5 mmHg  RV Systolic Pressure 46 mmHg  RV Diastolic Pressure 2 mmHg  RV EDP 10 mmHg  PA Systolic Pressure 53 mmHg  PA Diastolic Pressure 29 mmHg  PA Mean 39 mmHg  PW A Wave 22 mmHg  PW V Wave 21 mmHg  PW Mean 20 mmHg  AO Systolic Pressure 86 mmHg  AO Diastolic Pressure 61 mmHg  AO Mean 72 mmHg  LV Systolic Pressure 86 mmHg  LV Diastolic Pressure 8 mmHg  LV EDP 21 mmHg  Arterial Occlusion Pressure Extended Systolic Pressure 83 mmHg  Arterial Occlusion Pressure Extended Diastolic Pressure 56 mmHg  Arterial Occlusion Pressure Extended Mean Pressure 66 mmHg  Left Ventricular Apex Extended Systolic Pressure 85 mmHg  Left Ventricular Apex Extended Diastolic Pressure 6 mmHg  Left Ventricular Apex Extended EDP Pressure 17 mmHg  QP/QS 1  TPVR Index 19.63 HRUI  TSVR Index 36.23 HRUI  PVR SVR Ratio 0.28  TPVR/TSVR Ratio 0.54     ASSESSMENT AND PLAN:  1.  CAD with ischemic CM. EF 20%. Class 2 symptoms on optimal medical therapy. I personally reviewed his angiogram with him and his wife. He has CTO of the mid LAD which is a moderate  sized vessel. The RCA occlusion appears to be a small branch. We discussed options for revascularization. I think CTO PCI of the LAD is reasonable to help with his symptoms and to improve LV function. I would not attempt the RCA occlusion since this is a small branch and there is a risk of compromise of the larger branch that arises prior to the occlusion. The procedure was reviewed at length. The procedure and risks were reviewed including but not limited to death, myocardial infarction, perforation, stroke, arrythmias, bleeding, transfusion, emergency surgery, dye allergy, or renal dysfunction. The patient voices understanding and is agreeable to proceed. Will start ASA 81 mg daily and Plavix 75 mg daily. Procedure planned for Wednesday June 19. 2. Ischemic CM with chronic systolic CHF. On Bidil and Coreg. Does not appear to be volume overloaded. Unable to take ACEi, ARB, Entresto due to CKD 3. CKD. Stage 3. Last creatinine 1.8. Will need hydration prior to PCI. 4. Hypercholesterolemia. On statin. Followed by Dr. Einar Gip.  5. HTN controlled.   Current medicines are reviewed at length with the patient today.  The patient does not have concerns regarding medicines.  The following changes have been made:  See above  Labs/ tests ordered today include:   Orders Placed This Encounter  Procedures  . Basic metabolic panel  . CBC w/Diff/Platelet     Signed, Peter Martinique, MD  03/29/2018 1:44 PM    [facility] [address], [place], [place], [address] Phone [phone], Fax [phone]

## 2018-03-29 ENCOUNTER — Other Ambulatory Visit: Payer: Self-pay | Admitting: Cardiology

## 2018-03-29 ENCOUNTER — Encounter: Payer: Self-pay | Admitting: Cardiology

## 2018-03-29 ENCOUNTER — Ambulatory Visit (INDEPENDENT_AMBULATORY_CARE_PROVIDER_SITE_OTHER): Payer: Medicare Other | Admitting: Cardiology

## 2018-03-29 VITALS — BP 120/78 | HR 62 | Ht 74.0 in | Wt 228.2 lb

## 2018-03-29 DIAGNOSIS — I209 Angina pectoris, unspecified: Secondary | ICD-10-CM

## 2018-03-29 DIAGNOSIS — I25118 Atherosclerotic heart disease of native coronary artery with other forms of angina pectoris: Secondary | ICD-10-CM

## 2018-03-29 DIAGNOSIS — I255 Ischemic cardiomyopathy: Secondary | ICD-10-CM

## 2018-03-29 DIAGNOSIS — I5022 Chronic systolic (congestive) heart failure: Secondary | ICD-10-CM

## 2018-03-29 MED ORDER — ASPIRIN EC 81 MG PO TBEC: 81.0000 mg | DELAYED_RELEASE_TABLET | Freq: Every day | ORAL | 3 refills | Status: AC

## 2018-03-29 MED ORDER — CLOPIDOGREL BISULFATE 75 MG PO TABS: 75.0000 mg | ORAL_TABLET | Freq: Every day | ORAL | 3 refills | Status: DC

## 2018-03-29 NOTE — Patient Instructions (Addendum)
Start ASA 81 mg daily  Start Plavix 75 mg daily      [facility] [address] [place] [address] Dept: [phone] Loc: [phone]  JAMEAL RAZZANO  03/29/2018  You are scheduled for a Cardiac cath CTO on Wednesday 04/05/18, with Dr.Alayssa Flinchum.  1. Please arrive at the [facility] (Main Entrance A) at [facility]: [address] [place], [place] [address] at 8:30 am for Hydration (two hours before your procedure to ensure your preparation). Free valet parking service is available.   Special note: Every effort is made to have your procedure done on time. Please understand that emergencies sometimes delay scheduled procedures.  2. Diet: Nothing to eat or drink after midnight Tuesday 04/04/18  3. Labs: Today ( bmet,cbc )  4. Medication instructions in preparation for your procedure:     Hold Furosemide morning of procedure.     On the morning of your procedure, take your Aspirin and Plavix and any morning medicines NOT listed above.  You may use sips of water.  5. Plan for one night stay--bring personal belongings. 6. Bring a current list of your medications and current insurance cards. 7. You MUST have a responsible person to drive you home. 8. Someone MUST be with you the first 24 hours after you arrive home or your discharge will be delayed. 9. Please wear clothes that are easy to get on and off and wear slip-on shoes.  Thank you for allowing [place] to care for you!   -- [facility]

## 2018-03-30 LAB — CBC WITH DIFFERENTIAL/PLATELET
Basophils Absolute: 0.1 10*3/uL (ref 0.0–0.2)
Basos: 1 %
EOS (ABSOLUTE): 0.4 10*3/uL (ref 0.0–0.4)
EOS: 5 %
HEMATOCRIT: 39 % (ref 37.5–51.0)
HEMOGLOBIN: 12.9 g/dL — AB (ref 13.0–17.7)
IMMATURE GRANS (ABS): 0 10*3/uL (ref 0.0–0.1)
Immature Granulocytes: 0 %
LYMPHS: 17 %
Lymphocytes Absolute: 1.4 10*3/uL (ref 0.7–3.1)
MCH: 27.4 pg (ref 26.6–33.0)
MCHC: 33.1 g/dL (ref 31.5–35.7)
MCV: 83 fL (ref 79–97)
MONOCYTES: 19 %
Monocytes Absolute: 1.5 10*3/uL — ABNORMAL HIGH (ref 0.1–0.9)
Neutrophils Absolute: 4.5 10*3/uL (ref 1.4–7.0)
Neutrophils: 58 %
Platelets: 214 10*3/uL (ref 150–450)
RBC: 4.7 x10E6/uL (ref 4.14–5.80)
RDW: 18.9 % — ABNORMAL HIGH (ref 12.3–15.4)
WBC: 7.8 10*3/uL (ref 3.4–10.8)

## 2018-03-30 LAB — BASIC METABOLIC PANEL
BUN/Creatinine Ratio: 15 (ref 10–24)
BUN: 23 mg/dL (ref 8–27)
CALCIUM: 9.6 mg/dL (ref 8.6–10.2)
CHLORIDE: 100 mmol/L (ref 96–106)
CO2: 27 mmol/L (ref 20–29)
CREATININE: 1.55 mg/dL — AB (ref 0.76–1.27)
GFR calc Af Amer: 53 mL/min/{1.73_m2} — ABNORMAL LOW (ref >59)
GFR calc non Af Amer: 46 mL/min/{1.73_m2} — ABNORMAL LOW (ref >59)
Glucose: 80 mg/dL (ref 65–99)
Potassium: 4.8 mmol/L (ref 3.5–5.2)
Sodium: 141 mmol/L (ref 134–144)

## 2018-04-04 ENCOUNTER — Telehealth: Payer: Self-pay | Admitting: *Deleted

## 2018-04-04 NOTE — Telephone Encounter (Signed)
Pt contacted pre-catheterization scheduled at [facility] for: Wednesday April 05, 2018 11:30 AM Verified arrival time and place: [place] Entrance A at: 8:30 AM for hydration per instructions given to patient 03/29/18  No solid food after midnight prior to cath, clear liquids until 5 AM day of procedure. Verified no diabetes medications.  Hold: Furosemide AM of procedure. KCl AM of procedure.  Except hold medications AM meds can be  taken pre-cath with sip of water including: ASA 81 mg Clopidogrel 75 mg  Confirmed patient has responsible person to drive home post procedure and observe patient for 24 hours: yes

## 2018-04-05 ENCOUNTER — Other Ambulatory Visit: Payer: Self-pay

## 2018-04-05 ENCOUNTER — Ambulatory Visit (HOSPITAL_COMMUNITY)
Admission: RE | Disposition: A | Discharge status: Home / Self Care | Payer: Self-pay | Source: Ambulatory Visit | Attending: Cardiology

## 2018-04-05 ENCOUNTER — Ambulatory Visit (HOSPITAL_COMMUNITY)
Admission: RE | Admit: 2018-04-05 | Discharge: 2018-04-06 | Disposition: A | Discharge status: Home / Self Care | Payer: Medicare Other | Source: Ambulatory Visit | Attending: Cardiology | Admitting: Cardiology

## 2018-04-05 ENCOUNTER — Encounter (HOSPITAL_COMMUNITY): Payer: Self-pay | Admitting: General Practice

## 2018-04-05 DIAGNOSIS — I255 Ischemic cardiomyopathy: Secondary | ICD-10-CM | POA: Diagnosis not present

## 2018-04-05 DIAGNOSIS — Z7982 Long term (current) use of aspirin: Secondary | ICD-10-CM | POA: Insufficient documentation

## 2018-04-05 DIAGNOSIS — I130 Hypertensive heart and chronic kidney disease with heart failure and stage 1 through stage 4 chronic kidney disease, or unspecified chronic kidney disease: Secondary | ICD-10-CM | POA: Diagnosis not present

## 2018-04-05 DIAGNOSIS — I25119 Atherosclerotic heart disease of native coronary artery with unspecified angina pectoris: Secondary | ICD-10-CM

## 2018-04-05 DIAGNOSIS — E7800 Pure hypercholesterolemia, unspecified: Secondary | ICD-10-CM | POA: Diagnosis not present

## 2018-04-05 DIAGNOSIS — Z87891 Personal history of nicotine dependence: Secondary | ICD-10-CM | POA: Insufficient documentation

## 2018-04-05 DIAGNOSIS — N179 Acute kidney failure, unspecified: Secondary | ICD-10-CM | POA: Diagnosis not present

## 2018-04-05 DIAGNOSIS — M1990 Unspecified osteoarthritis, unspecified site: Secondary | ICD-10-CM | POA: Diagnosis not present

## 2018-04-05 DIAGNOSIS — I5022 Chronic systolic (congestive) heart failure: Secondary | ICD-10-CM | POA: Diagnosis not present

## 2018-04-05 DIAGNOSIS — I209 Angina pectoris, unspecified: Secondary | ICD-10-CM | POA: Diagnosis present

## 2018-04-05 DIAGNOSIS — K219 Gastro-esophageal reflux disease without esophagitis: Secondary | ICD-10-CM | POA: Diagnosis not present

## 2018-04-05 DIAGNOSIS — Z7902 Long term (current) use of antithrombotics/antiplatelets: Secondary | ICD-10-CM | POA: Diagnosis not present

## 2018-04-05 DIAGNOSIS — I272 Pulmonary hypertension, unspecified: Secondary | ICD-10-CM | POA: Diagnosis not present

## 2018-04-05 DIAGNOSIS — N183 Chronic kidney disease, stage 3 (moderate): Secondary | ICD-10-CM | POA: Insufficient documentation

## 2018-04-05 DIAGNOSIS — I25110 Atherosclerotic heart disease of native coronary artery with unstable angina pectoris: Secondary | ICD-10-CM | POA: Insufficient documentation

## 2018-04-05 DIAGNOSIS — I2582 Chronic total occlusion of coronary artery: Secondary | ICD-10-CM | POA: Diagnosis not present

## 2018-04-05 DIAGNOSIS — Z955 Presence of coronary angioplasty implant and graft: Secondary | ICD-10-CM

## 2018-04-05 DIAGNOSIS — I447 Left bundle-branch block, unspecified: Secondary | ICD-10-CM | POA: Diagnosis not present

## 2018-04-05 DIAGNOSIS — I25118 Atherosclerotic heart disease of native coronary artery with other forms of angina pectoris: Secondary | ICD-10-CM

## 2018-04-05 DIAGNOSIS — Z880 Allergy status to penicillin: Secondary | ICD-10-CM | POA: Insufficient documentation

## 2018-04-05 HISTORY — DX: Reserved for concepts with insufficient information to code with codable children: IMO0002

## 2018-04-05 HISTORY — PX: CORONARY ANGIOPLASTY WITH STENT PLACEMENT: SHX49

## 2018-04-05 HISTORY — DX: Pure hypercholesterolemia, unspecified: E78.00

## 2018-04-05 HISTORY — DX: Pneumoconiosis due to asbestos and other mineral fibers: J61

## 2018-04-05 HISTORY — PX: ULTRASOUND GUIDANCE FOR VASCULAR ACCESS: SHX6516

## 2018-04-05 HISTORY — PX: CORONARY CTO INTERVENTION: CATH118236

## 2018-04-05 HISTORY — DX: Basal cell carcinoma of skin, unspecified: C44.91

## 2018-04-05 LAB — POCT ACTIVATED CLOTTING TIME
ACTIVATED CLOTTING TIME: 257 s
ACTIVATED CLOTTING TIME: 274 s
ACTIVATED CLOTTING TIME: 290 s
ACTIVATED CLOTTING TIME: 180 s
Activated Clotting Time: 285 seconds
Activated Clotting Time: 202 seconds

## 2018-04-05 SURGERY — CORONARY CTO INTERVENTION
Anesthesia: LOCAL

## 2018-04-05 MED ORDER — FENTANYL CITRATE (PF) 100 MCG/2ML IJ SOLN
INTRAMUSCULAR | Status: AC
  Filled 2018-04-05: qty 2

## 2018-04-05 MED ORDER — SODIUM CHLORIDE 0.9 % WEIGHT BASED INFUSION
1.0000 mL/kg/h | INTRAVENOUS | Status: AC
  Administered 2018-04-05: 14:00:00 1 mL/kg/h via INTRAVENOUS

## 2018-04-05 MED ORDER — SODIUM CHLORIDE 0.9 % IV SOLN
INTRAVENOUS | Status: DC
  Administered 2018-04-05: 09:00:00 via INTRAVENOUS

## 2018-04-05 MED ORDER — SODIUM CHLORIDE 0.9 % IV SOLN
INTRAVENOUS | Status: AC | PRN
  Administered 2018-04-05: 10 mL/h via INTRAVENOUS

## 2018-04-05 MED ORDER — SODIUM CHLORIDE 0.9% FLUSH: 3.0000 mL | INTRAVENOUS | Status: DC | PRN

## 2018-04-05 MED ORDER — IOPAMIDOL (ISOVUE-370) INJECTION 76%
INTRAVENOUS | Status: AC
  Filled 2018-04-05: qty 100

## 2018-04-05 MED ORDER — ASPIRIN 81 MG PO CHEW: 81.0000 mg | CHEWABLE_TABLET | ORAL | Status: DC

## 2018-04-05 MED ORDER — LIDOCAINE HCL (PF) 1 % IJ SOLN
INTRAMUSCULAR | Status: AC
  Filled 2018-04-05: qty 30

## 2018-04-05 MED ORDER — NITROGLYCERIN 1 MG/10 ML FOR IR/CATH LAB
INTRA_ARTERIAL | Status: AC
  Filled 2018-04-05: qty 10

## 2018-04-05 MED ORDER — HEPARIN (PORCINE) IN NACL 1000-0.9 UT/500ML-% IV SOLN
INTRAVENOUS | Status: AC
  Filled 2018-04-05: qty 500

## 2018-04-05 MED ORDER — HEPARIN SODIUM (PORCINE) 1000 UNIT/ML IJ SOLN
INTRAMUSCULAR | Status: AC
  Filled 2018-04-05: qty 1

## 2018-04-05 MED ORDER — LIDOCAINE HCL (PF) 1 % IJ SOLN
INTRAMUSCULAR | Status: DC | PRN
  Administered 2018-04-05 (×2): 15 mL

## 2018-04-05 MED ORDER — NITROGLYCERIN 1 MG/10 ML FOR IR/CATH LAB
INTRA_ARTERIAL | Status: DC | PRN
  Administered 2018-04-05: 200 ug via INTRACORONARY
  Administered 2018-04-05: 200 ug
  Administered 2018-04-05: 200 ug via INTRACORONARY

## 2018-04-05 MED ORDER — SODIUM CHLORIDE 0.9% FLUSH: 3.0000 mL | Freq: Two times a day (BID) | INTRAVENOUS | Status: DC

## 2018-04-05 MED ORDER — MIDAZOLAM HCL 2 MG/2ML IJ SOLN
INTRAMUSCULAR | Status: DC | PRN
  Administered 2018-04-05: 2 mg via INTRAVENOUS

## 2018-04-05 MED ORDER — HEPARIN (PORCINE) IN NACL 1000-0.9 UT/500ML-% IV SOLN
INTRAVENOUS | Status: AC
  Filled 2018-04-05: qty 1000

## 2018-04-05 MED ORDER — IVABRADINE HCL 7.5 MG PO TABS
7.5000 mg | ORAL_TABLET | Freq: Two times a day (BID) | ORAL | Status: DC
  Administered 2018-04-05: 7.5 mg via ORAL
  Filled 2018-04-05 (×2): qty 1

## 2018-04-05 MED ORDER — SODIUM CHLORIDE 0.9 % IV SOLN: 250.0000 mL | INTRAVENOUS | Status: DC | PRN

## 2018-04-05 MED ORDER — CLOPIDOGREL BISULFATE 75 MG PO TABS
75.0000 mg | ORAL_TABLET | Freq: Every day | ORAL | Status: DC
  Filled 2018-04-05: qty 1

## 2018-04-05 MED ORDER — ISOSORB DINITRATE-HYDRALAZINE 20-37.5 MG PO TABS
1.0000 | ORAL_TABLET | Freq: Three times a day (TID) | ORAL | Status: DC
  Administered 2018-04-05 (×2): 1 via ORAL
  Filled 2018-04-05 (×3): qty 1

## 2018-04-05 MED ORDER — LORATADINE 10 MG PO TABS
10.0000 mg | ORAL_TABLET | Freq: Every day | ORAL | Status: DC
  Filled 2018-04-05 (×2): qty 1

## 2018-04-05 MED ORDER — PANTOPRAZOLE SODIUM 40 MG PO TBEC
40.0000 mg | DELAYED_RELEASE_TABLET | Freq: Every day | ORAL | Status: DC
  Filled 2018-04-05 (×2): qty 1

## 2018-04-05 MED ORDER — MIDAZOLAM HCL 2 MG/2ML IJ SOLN
INTRAMUSCULAR | Status: AC
  Filled 2018-04-05: qty 2

## 2018-04-05 MED ORDER — POTASSIUM CHLORIDE CRYS ER 10 MEQ PO TBCR
10.0000 meq | EXTENDED_RELEASE_TABLET | Freq: Every day | ORAL | Status: DC
  Administered 2018-04-05: 19:00:00 10 meq via ORAL
  Filled 2018-04-05 (×4): qty 1

## 2018-04-05 MED ORDER — FENTANYL CITRATE (PF) 100 MCG/2ML IJ SOLN
INTRAMUSCULAR | Status: DC | PRN
  Administered 2018-04-05: 25 ug via INTRAVENOUS

## 2018-04-05 MED ORDER — HEPARIN SODIUM (PORCINE) 1000 UNIT/ML IJ SOLN
INTRAMUSCULAR | Status: DC | PRN
  Administered 2018-04-05: 4000 [IU] via INTRAVENOUS
  Administered 2018-04-05: 10000 [IU] via INTRAVENOUS
  Administered 2018-04-05: 3000 [IU] via INTRAVENOUS
  Administered 2018-04-05: 2000 [IU] via INTRAVENOUS

## 2018-04-05 MED ORDER — ASPIRIN EC 81 MG PO TBEC
81.0000 mg | DELAYED_RELEASE_TABLET | Freq: Every day | ORAL | Status: DC
  Filled 2018-04-05: qty 1

## 2018-04-05 MED ORDER — IOPAMIDOL (ISOVUE-370) INJECTION 76%
INTRAVENOUS | Status: DC | PRN
  Administered 2018-04-05: 160 mL via INTRAVENOUS

## 2018-04-05 MED ORDER — ROSUVASTATIN CALCIUM 10 MG PO TABS
10.0000 mg | ORAL_TABLET | Freq: Every day | ORAL | Status: DC
  Administered 2018-04-05: 19:00:00 10 mg via ORAL
  Filled 2018-04-05 (×2): qty 1

## 2018-04-05 MED ORDER — FUROSEMIDE 20 MG PO TABS
20.0000 mg | ORAL_TABLET | Freq: Two times a day (BID) | ORAL | Status: DC
  Administered 2018-04-05 (×2): 20 mg via ORAL
  Filled 2018-04-05 (×3): qty 1

## 2018-04-05 MED ORDER — HEPARIN (PORCINE) IN NACL 2-0.9 UNITS/ML
INTRAMUSCULAR | Status: AC | PRN
  Administered 2018-04-05 (×2): 1000 mL via INTRA_ARTERIAL

## 2018-04-05 MED ORDER — IOPAMIDOL (ISOVUE-370) INJECTION 76%
INTRAVENOUS | Status: AC
  Filled 2018-04-05: qty 125

## 2018-04-05 MED ORDER — ONDANSETRON HCL 4 MG/2ML IJ SOLN: 4.0000 mg | Freq: Four times a day (QID) | INTRAMUSCULAR | Status: DC | PRN

## 2018-04-05 MED ORDER — ACETAMINOPHEN 325 MG PO TABS: 650.0000 mg | ORAL_TABLET | ORAL | Status: DC | PRN

## 2018-04-05 MED ORDER — CARVEDILOL 12.5 MG PO TABS
12.5000 mg | ORAL_TABLET | Freq: Two times a day (BID) | ORAL | Status: DC
  Administered 2018-04-05: 12.5 mg via ORAL
  Filled 2018-04-05 (×2): qty 1

## 2018-04-05 SURGICAL SUPPLY — 30 items
BALLN SAPPHIRE 2.0X20 (BALLOONS) ×3
BALLN SAPPHIRE 2.5X15 (BALLOONS) ×3
BALLN ~~LOC~~ EMERGE MR 2.5X20 (BALLOONS) ×3
BALLN ~~LOC~~ EMERGE MR 2.75X20 (BALLOONS) ×3
BALLOON SAPPHIRE 2.0X20 (BALLOONS) IMPLANT
BALLOON SAPPHIRE 2.5X15 (BALLOONS) IMPLANT
BALLOON ~~LOC~~ EMERGE MR 2.5X20 (BALLOONS) IMPLANT
BALLOON ~~LOC~~ EMERGE MR 2.75X20 (BALLOONS) IMPLANT
CATH INFINITI JR4 5F (CATHETERS) ×1 IMPLANT
CATH MACH1 8F CLS4 (CATHETERS) ×1 IMPLANT
CATH MACH1 8F VL3.5 (CATHETERS) ×1 IMPLANT
CATH TRAPPER 6-8F (CATHETERS) ×1 IMPLANT
CATH TURNPIKE 135CM (CATHETERS) ×1 IMPLANT
COVER PRB 48X5XTLSCP FOLD TPE (BAG) IMPLANT
COVER PROBE 5X48 (BAG) ×3
ELECT DEFIB PAD ADLT CADENCE (PAD) ×1 IMPLANT
HOVERMATT SINGLE USE (MISCELLANEOUS) ×1 IMPLANT
KIT ENCORE 26 ADVANTAGE (KITS) ×1 IMPLANT
KIT HEART LEFT (KITS) ×4 IMPLANT
KIT HEMO VALVE WATCHDOG (MISCELLANEOUS) ×1 IMPLANT
PACK CARDIAC CATHETERIZATION (CUSTOM PROCEDURE TRAY) ×3 IMPLANT
SHEATH BRITE TIP 8FR 35CM (SHEATH) ×1 IMPLANT
SHEATH PINNACLE 5F 10CM (SHEATH) ×1 IMPLANT
STENT SYNERGY DES 2.25X38 (Permanent Stent) ×1 IMPLANT
STENT SYNERGY DES 2.5X24 (Permanent Stent) ×1 IMPLANT
TRANSDUCER W/STOPCOCK (MISCELLANEOUS) ×4 IMPLANT
TUBING CIL FLEX 10 FLL-RA (TUBING) ×4 IMPLANT
WIRE ASAHI PROWATER 180CM (WIRE) ×1 IMPLANT
WIRE EMERALD 3MM-J .035X150CM (WIRE) ×1 IMPLANT
WIRE FIGHTER CROSSING 190CM (WIRE) ×1 IMPLANT

## 2018-04-05 NOTE — Interval H&P Note (Signed)
History and Physical Interval Note:  04/05/2018 10:46 AM  Franklin Jones  has presented today for surgery, with the diagnosis of cad  The various methods of treatment have been discussed with the patient and family. After consideration of risks, benefits and other options for treatment, the patient has consented to  Procedure(s): CORONARY CTO INTERVENTION (N/A) as a surgical intervention .  The patient's history has been reviewed, patient examined, no change in status, stable for surgery.  I have reviewed the patient's chart and labs.  Questions were answered to the patient's satisfaction.   Cath Lab Visit (complete for each Cath Lab visit)  Clinical Evaluation Leading to the Procedure:   ACS: No.  Non-ACS:    Anginal Classification: CCS III  Anti-ischemic medical therapy: Maximal Therapy (2 or more classes of medications)  Non-Invasive Test Results: No non-invasive testing performed  Prior CABG: No previous CABG        Collier Salina [facility] 04/05/2018 10:47 AM

## 2018-04-05 NOTE — Progress Notes (Signed)
Site area: right groin  Site Prior to Removal:  Level 0  Pressure Applied For 30 MINUTES    Minutes Beginning at 1630  Manual:   Yes.    Patient Status During Pull:  Stable   Post Pull Groin Site:  Level 0  Post Pull Instructions Given:  Yes.    Post Pull Pulses Present:  Yes.    Dressing Applied:  Yes.    Site area: left groin  Site Prior to Removal:  Level 0  Pressure Applied For 20 MINUTES    Minutes Beginning at 1635  Manual:   Yes.    Patient Status During Pull:  Stable   Post Pull Groin Site:  Level 0  Post Pull Instructions Given:  Yes.    Post Pull Pulses Present:  Yes.    Dressing Applied:  Yes.    Comments:  Right 8 fr sheath pulled by Jennye Moccasin and left 5 fr sheath pulled by Dina Rich RN

## 2018-04-06 DIAGNOSIS — Z955 Presence of coronary angioplasty implant and graft: Secondary | ICD-10-CM

## 2018-04-06 DIAGNOSIS — I130 Hypertensive heart and chronic kidney disease with heart failure and stage 1 through stage 4 chronic kidney disease, or unspecified chronic kidney disease: Secondary | ICD-10-CM | POA: Diagnosis not present

## 2018-04-06 DIAGNOSIS — I2582 Chronic total occlusion of coronary artery: Secondary | ICD-10-CM | POA: Diagnosis not present

## 2018-04-06 DIAGNOSIS — I25118 Atherosclerotic heart disease of native coronary artery with other forms of angina pectoris: Secondary | ICD-10-CM

## 2018-04-06 DIAGNOSIS — I255 Ischemic cardiomyopathy: Secondary | ICD-10-CM | POA: Diagnosis not present

## 2018-04-06 DIAGNOSIS — N179 Acute kidney failure, unspecified: Secondary | ICD-10-CM | POA: Diagnosis not present

## 2018-04-06 DIAGNOSIS — I25110 Atherosclerotic heart disease of native coronary artery with unstable angina pectoris: Secondary | ICD-10-CM | POA: Diagnosis not present

## 2018-04-06 DIAGNOSIS — E7800 Pure hypercholesterolemia, unspecified: Secondary | ICD-10-CM | POA: Diagnosis not present

## 2018-04-06 DIAGNOSIS — I5022 Chronic systolic (congestive) heart failure: Secondary | ICD-10-CM | POA: Diagnosis not present

## 2018-04-06 DIAGNOSIS — M1990 Unspecified osteoarthritis, unspecified site: Secondary | ICD-10-CM | POA: Diagnosis not present

## 2018-04-06 DIAGNOSIS — K219 Gastro-esophageal reflux disease without esophagitis: Secondary | ICD-10-CM | POA: Diagnosis not present

## 2018-04-06 DIAGNOSIS — I447 Left bundle-branch block, unspecified: Secondary | ICD-10-CM | POA: Diagnosis not present

## 2018-04-06 DIAGNOSIS — I272 Pulmonary hypertension, unspecified: Secondary | ICD-10-CM | POA: Diagnosis not present

## 2018-04-06 DIAGNOSIS — N183 Chronic kidney disease, stage 3 (moderate): Secondary | ICD-10-CM | POA: Diagnosis not present

## 2018-04-06 LAB — BASIC METABOLIC PANEL
Anion gap: 6 (ref 5–15)
BUN: 21 mg/dL — ABNORMAL HIGH (ref 6–20)
CALCIUM: 8.8 mg/dL — AB (ref 8.9–10.3)
CHLORIDE: 108 mmol/L (ref 101–111)
CO2: 26 mmol/L (ref 22–32)
CREATININE: 1.48 mg/dL — AB (ref 0.61–1.24)
GFR calc non Af Amer: 47 mL/min — ABNORMAL LOW (ref >60)
GFR, EST AFRICAN AMERICAN: 55 mL/min — AB (ref >60)
Glucose, Bld: 134 mg/dL — ABNORMAL HIGH (ref 65–99)
Potassium: 4.4 mmol/L (ref 3.5–5.1)
SODIUM: 140 mmol/L (ref 135–145)

## 2018-04-06 LAB — CBC
HCT: 36.5 % — ABNORMAL LOW (ref 39.0–52.0)
HEMOGLOBIN: 11.7 g/dL — AB (ref 13.0–17.0)
MCH: 27.3 pg (ref 26.0–34.0)
MCHC: 32.1 g/dL (ref 30.0–36.0)
MCV: 85.3 fL (ref 78.0–100.0)
PLATELETS: 151 10*3/uL (ref 150–400)
RBC: 4.28 MIL/uL (ref 4.22–5.81)
RDW: 17.9 % — ABNORMAL HIGH (ref 11.5–15.5)
WBC: 7 10*3/uL (ref 4.0–10.5)

## 2018-04-06 MED ORDER — NITROGLYCERIN 0.4 MG SL SUBL: 0.4000 mg | SUBLINGUAL_TABLET | SUBLINGUAL | 3 refills | Status: DC | PRN

## 2018-04-06 MED ORDER — ANGIOPLASTY BOOK
Freq: Once | Status: AC
  Administered 2018-04-06: 06:00:00
  Filled 2018-04-06: qty 1

## 2018-04-06 MED FILL — Heparin Sod (Porcine)-NaCl IV Soln 1000 Unit/500ML-0.9%: INTRAVENOUS | Qty: 500 | Status: AC

## 2018-04-06 MED FILL — Heparin Sod (Porcine)-NaCl IV Soln 1000 Unit/500ML-0.9%: INTRAVENOUS | Qty: 2000 | Status: AC

## 2018-04-06 NOTE — Discharge Instructions (Signed)
PLEASE REMEMBER TO BRING ALL OF YOUR MEDICATIONS TO EACH OF YOUR FOLLOW-UP OFFICE VISITS.  PLEASE ATTEND ALL SCHEDULED FOLLOW-UP APPOINTMENTS.   Activity: Increase activity slowly as tolerated. You may shower, but no soaking baths (or swimming) for 1 week. No driving for 3 days. No lifting over 5 lbs for 1 week. No sexual activity for 1 week.   You May Return to Work: in 1 week (if applicable)  Wound Care: You may wash cath site gently with soap and water. Keep cath site clean and dry. If you notice pain, swelling, bleeding or pus at your cath site, please call [phone].

## 2018-04-06 NOTE — Discharge Summary (Signed)
Discharge Summary    Patient ID: ABE SCHOOLS,  MRN: [id number], DOB/AGE: October 19, 1950 67 y.o.  Admit date: 04/05/2018 Discharge date: 04/06/2018  Primary Care Provider: Glenda Chroman Primary Cardiologist: Dr. Nadyne Coombes  Discharge Diagnoses    Principal Problem:   Angina pectoris [facility]) Active Problems:   TOBACCO ABUSE, HX OF   Chronic systolic CHF (congestive heart failure) (HCC)   Coronary artery disease involving native coronary artery of native heart with unstable angina pectoris (HCC)   Allergies Allergies  Allergen Reactions  . Penicillins Rash    Has patient had a PCN reaction causing immediate rash, facial/tongue/throat swelling, SOB or lightheadedness with hypotension: unkn Has patient had a PCN reaction causing severe rash involving mucus membranes or skin necrosis: unkn Has patient had a PCN reaction that required hospitalization: no Has patient had a PCN reaction occurring within the last 10 years: no If all of the above answers are "NO", then may proceed with Cephalosporin use.     Diagnostic Studies/Procedures    Coronary CTO intervention 04/05/18: Conclusion     Prox LAD to Mid LAD lesion is 100% stenosed.  Post intervention, there is a 0% residual stenosis.  A drug-eluting stent was successfully placed using a STENT SYNERGY DES 2.25X38.  A drug-eluting stent was successfully placed using a STENT SYNERGY DES 2.5X24.   1. Successful CTO PCI of the mid LAD with DES x 2.  Plan: DAPT for one year. Anticipate DC in am. The distal RCA is diffusely diseased and relatively small. We will see how he does with this on medical therapy. Discussed with Dr. Einar Gip.     _____________   History of Present Illness     Franklin Jones is a 67 y.o. male who is seen at the request of Dr. Einar Gip for consideration of CTO PCI. He was initially admitted to the hospital in February 2019 with acute CHF. EF was 20%. He had a LBBB and elevated BNP > 700. Creatinine was elevated  to 2.07. He was treated with Coreg, Bidil and IV lasix. ARB held due to AKI. CT of the chest did show 3 vessel coronary calcification.   He underwent right and left heart cath on March 12 by Dr. Einar Gip. This demonstrated moderate pulmonary HTN with PCWP of 20 mm Hg. He had occlusion of the LAD that filled by right to left collaterals. The PL branch of the RCA was also occluded with collaterals. Attempt was made to cross the PL branch with a wire but was unsuccessful. Since then medical therapy has been optimized by Dr. Einar Gip and he is seen to consider PCI.   On evaluation 03/29/18 the patient noted occasional indigestion which is his anginal symptom. He noted some dyspnea with walking 1/2 mile or going up stairs. He is wearing a Lifevest. No palpitations. He is tolerating medication well. Patient reports he had a follow up Echo last week. He was recommended for CTO PCI of the LAD.    Hospital Course     Consultants: None   1. CAD with ischemic CM: Patient with recent LHC by Dr. Nadyne Coombes which revealed multivessel disease - Dr. Nadyne Coombes recommended consideration for impella assisted high risk intervention vs. CABG. Patient was evaluated by Dr. Martinique outpatient and felt to be a good candidate for CTO PCI. Patient underwent coronary CTO intervention 04/05/18 with 2 stents placed to LAD. He was recommended for DAPT for 1 year.  - Continue ASA, plavix, and statin  2. Ischemic CM  with EF 20%: no volume overload complaints and appears euvolemic on exam. Monitored on telemetry without evidence of arrhythmias - Continue carvedilol, bidil, furosemide, and ivabradine  - Continue life vest  3. CKD stage III: Cr stable post cath - Continue to monitor routinely outpatient  4. HLD:  - Continue statin  5. HTN: BP stable  - Continue current regimen   _____________  Discharge Vitals Blood pressure 120/66, pulse (!) 50, temperature 97.6 F (36.4 C), temperature source Oral, resp. rate 19, height 6\' 2"  (1.88  m), weight 222 lb 7.1 oz (100.9 kg), SpO2 96 %.  Filed Weights   04/05/18 0826 04/06/18 0430  Weight: 225 lb (102.1 kg) 222 lb 7.1 oz (100.9 kg)    IRC:VELFYB in bed in no acute distress.   Neck:No JVD, no carotid bruits Cardiac: RRR, no murmurs, rubs, or gallops. B/L groin cath sites without significant ecchymosis or   Respiratory:Clear to auscultation bilaterally, mild expiratory wheeze; no rales/ rhonchi OF:BPZW, Soft, nontender, non-distended  MS:No edema; No deformity. Neuro:Nonfocal, moving all extremities spontaneously Psych: Normal affect     Labs & Radiologic Studies    CBC Recent Labs    04/06/18 0242  WBC 7.0  HGB 11.7*  HCT 36.5*  MCV 85.3  PLT 258   Basic Metabolic Panel Recent Labs    04/06/18 0242  NA 140  K 4.4  CL 108  CO2 26  GLUCOSE 134*  BUN 21*  CREATININE 1.48*  CALCIUM 8.8*   Liver Function Tests No results for input(s): AST, ALT, ALKPHOS, BILITOT, PROT, ALBUMIN in the last 72 hours. No results for input(s): LIPASE, AMYLASE in the last 72 hours. Cardiac Enzymes No results for input(s): CKTOTAL, CKMB, CKMBINDEX, TROPONINI in the last 72 hours. BNP Invalid input(s): POCBNP D-Dimer No results for input(s): DDIMER in the last 72 hours. Hemoglobin A1C No results for input(s): HGBA1C in the last 72 hours. Fasting Lipid Panel No results for input(s): CHOL, HDL, LDLCALC, TRIG, CHOLHDL, LDLDIRECT in the last 72 hours. Thyroid Function Tests No results for input(s): TSH, T4TOTAL, T3FREE, THYROIDAB in the last 72 hours.  Invalid input(s): FREET3 _____________  No results found. Disposition   Patient was seen and examined by Dr. Ellyn Hack who deemed patient as stable for discharge. Follow-up has been arranged. Discharge medications as listed below.   Follow-up Plans & Appointments    Follow-up Information    Martinique, Peter M, MD Follow up on 04/27/2018.   Specialty:  Cardiology Why:  Please arrive 15 minutes early for your 10:40am  appointment Contact information: [place] [address] [phone]        Adrian Prows, MD Follow up.   Specialty:  Cardiology Why:  Please call to schedule an appointment to be seen within 1-2 weeks of discharge Contact information: [place] [address] [phone]            Discharge Medications   Allergies as of 04/06/2018      Reactions   Penicillins Rash   Has patient had a PCN reaction causing immediate rash, facial/tongue/throat swelling, SOB or lightheadedness with hypotension: unkn Has patient had a PCN reaction causing severe rash involving mucus membranes or skin necrosis: unkn Has patient had a PCN reaction that required hospitalization: no Has patient had a PCN reaction occurring within the last 10 years: no If all of the above answers are "NO", then may proceed with Cephalosporin use.      Medication List  TAKE these medications   albuterol 108 (90 Base) MCG/ACT inhaler Commonly known as:  PROVENTIL HFA;VENTOLIN HFA Inhale 2 puffs into the lungs every 4 (four) hours as needed for wheezing or shortness of breath.   aluminum hydroxide-magnesium carbonate 95-358 MG/15ML Susp Commonly known as:  GAVISCON Take 15 mLs by mouth daily as needed for indigestion.   aspirin EC 81 MG tablet Take 1 tablet (81 mg total) by mouth daily.   carvedilol 12.5 MG tablet Commonly known as:  COREG Take 12.5 mg by mouth 2 (two) times daily.   clopidogrel 75 MG tablet Commonly known as:  PLAVIX Take 1 tablet (75 mg total) by mouth daily.   fexofenadine 180 MG tablet Commonly known as:  ALLEGRA Take 180 mg by mouth daily.   furosemide 20 MG tablet Commonly known as:  LASIX Take 20 mg by mouth 2 (two) times daily.   isosorbide-hydrALAZINE 20-37.5 MG tablet Commonly known as:  BIDIL Take 1 tablet by mouth 3 (three) times daily.   ivabradine 7.5 MG Tabs tablet Commonly known as:  CORLANOR Take 7.5 mg by  mouth 2 (two) times daily with a meal.   pantoprazole 40 MG tablet Commonly known as:  PROTONIX Take 1 tablet (40 mg total) by mouth daily.   potassium chloride 10 MEQ tablet Commonly known as:  K-DUR Take 10 mEq by mouth daily.   rosuvastatin 10 MG tablet Commonly known as:  CRESTOR Take 10 mg by mouth daily.        Aspirin prescribed at discharge?  Yes High Intensity Statin Prescribed? (Lipitor 40-80mg  or Crestor 20-40mg ): No: defer to Dr. Loann Quill Blocker Prescribed? Yes For EF <40%, was ACEI/ARB Prescribed? No: CKD ADP Receptor Inhibitor Prescribed? (i.e. Plavix etc.-Includes Medically Managed Patients): Yes For EF <40%, Aldosterone Inhibitor Prescribed? No: CKD Was EF assessed during THIS hospitalization? No: assessed on prior admission Was Cardiac Rehab II ordered? (Included Medically managed Patients): Yes   Outstanding Labs/Studies   Follow up outpatient with Dr. Nadyne Coombes and Dr. Martinique Will need repeat echo to monitor for improvement in ICM.   Duration of Discharge Encounter   Greater than 30 minutes including physician time.  Signed, Abigail Butts PA-C 04/06/2018, 8:26 AM

## 2018-04-06 NOTE — Progress Notes (Signed)
CARDIAC REHAB PHASE I   PRE:  Rate/Rhythm: 63 SR  BP:  Supine:   Sitting: 153/59  Standing:    SaO2: 94%RA  MODE:  Ambulation: 800 ft   POST:  Rate/Rhythm: 74 SR  BP:  Supine:   Sitting: 152/61  Standing:    SaO2: 93%RA 0820-0930 Pt walked 800 ft with steady gait and no CP or SOB. Education completed with pt and wife who voiced understanding. Stressed importance of plavix with stent. Reviewed NTG use, ex ed, heart healthy and low sodium diets. Gave CHF booklet due to low EF and low sodium handouts. Discussed when to call MD with signs and symptoms. Discussed CRP 2 and referred to [facility] program.   Graylon Good, RN BSN  04/06/2018 9:26 AM

## 2018-04-17 DIAGNOSIS — I25118 Atherosclerotic heart disease of native coronary artery with other forms of angina pectoris: Secondary | ICD-10-CM | POA: Diagnosis not present

## 2018-04-17 DIAGNOSIS — I255 Ischemic cardiomyopathy: Secondary | ICD-10-CM | POA: Diagnosis not present

## 2018-04-17 DIAGNOSIS — I5042 Chronic combined systolic (congestive) and diastolic (congestive) heart failure: Secondary | ICD-10-CM | POA: Diagnosis not present

## 2018-04-17 DIAGNOSIS — I447 Left bundle-branch block, unspecified: Secondary | ICD-10-CM | POA: Diagnosis not present

## 2018-04-21 ENCOUNTER — Telehealth: Payer: Self-pay | Admitting: Cardiology

## 2018-04-21 NOTE — Telephone Encounter (Signed)
Received records from [facility] on 04/21/18 @ 10:40AM. [place]

## 2018-04-26 NOTE — Progress Notes (Signed)
Cardiology Office Note   Date:  04/27/2018   ID:  Franklin Jones, DOB 11/12/1950, MRN [id number]  PCP:  Glenda Chroman, MD  Cardiologist:  Adrian Prows MD  Chief Complaint  Patient presents with  . Follow-up  . Coronary Artery Disease      History of Present Illness: Franklin Jones is a 67 y.o. male who is seen for follow up s/p CTO PCI of the LAD. He was initially admitted to the hospital in February 2019 with acute CHF. EF was 20%. He had a LBBB and elevated BNP > 700. Creatinine was elevated to 2.07. He was treated with Coreg, Bidil and IV lasix. ARB held due to AKI. CT of the chest did show 3 vessel coronary calcification.   He underwent right and left heart cath on March 12 by Dr. Einar Gip. This demonstrated moderate pulmonary HTN with PCWP of 20 mm Hg. He had occlusion of the LAD that filled by right to left collaterals. The PL branch of the RCA was also occluded with collaterals. Attempt was made to cross the PL branch with a wire but was unsuccessful. Since then medical therapy has been optimized by Dr. Einar Gip. He subsequently underwent successful CTO PCI of the LAD on 04/05/18.   On follow up today he is doing better. Notes significant improvement in energy, fatigue and dyspnea. Was able to walk 2 miles this week whereas before he was SOB walking across the room. No groin complications.   Past Medical History:  Diagnosis Date  . Allergic rhinitis   . Arthritis    "qwhere" (6//19/2019)  . Asbestosis(501)    followed by Dr Barkley Boards- "on monitoring only; stable x years" (04/05/2018)  . Basal cell carcinoma    "face; head" (04/05/2018)  . CHF (congestive heart failure) ([place])   . Degenerative arthritis of knee 11/19/2011  . GERD (gastroesophageal reflux disease)   . High cholesterol   . History of asbestos exposure    followed by Dr West Carbo- states stable years- last CT chest 7/12 in Epic  . Hypertension   . Shortness of breath    SOME SOB BUT CAN CLIMB FLIGHT OF STAIRS  .  Squamous carcinoma    "back of neck" (04/05/2018)    Past Surgical History:  Procedure Laterality Date  . COLONOSCOPY    . COLONOSCOPY N/A 08/22/2014   Procedure: COLONOSCOPY;  Surgeon: Rogene Houston, MD;  Location: [facility];  Service: Endoscopy;  Laterality: N/A;  830  . CORONARY ANGIOPLASTY WITH STENT PLACEMENT  04/05/2018   CTO PCI of the mid LAD with DES x 2.  . CORONARY CTO INTERVENTION N/A 04/05/2018   Procedure: CORONARY CTO INTERVENTION;  Surgeon: Martinique, Josette Shimabukuro M, MD;  Location: [facility];  Service: Cardiovascular;  Laterality: N/A;  . FINGER AMPUTATION Left 2009   traumatic injury left small finger with amputation  . JOINT REPLACEMENT    . KNEE ARTHROPLASTY  11/19/2011   Procedure: COMPUTER ASSISTED TOTAL KNEE ARTHROPLASTY;  Surgeon: Mcarthur Rossetti, MD;  Location: [facility];  Service: Orthopedics;  Laterality: Right;  Right Total Knee Arthroplasty  . MASS EXCISION Left 03/12/2014   Procedure: EXCISION LEFT NECK SKIN MASS;  Surgeon: Gayland Curry, MD;  Location: [facility];  Service: General;  Laterality: Left;  . RIGHT/LEFT HEART CATH AND CORONARY ANGIOGRAPHY N/A 12/27/2017   Procedure: RIGHT/LEFT HEART CATH AND CORONARY ANGIOGRAPHY;  Surgeon: Adrian Prows, MD;  Location: [facility];  Service: Cardiovascular;  Laterality: N/A;  . TOTAL KNEE ARTHROPLASTY  08/18/2012   Procedure: TOTAL KNEE ARTHROPLASTY;  Surgeon: Mcarthur Rossetti, MD;  Location: [facility];  Service: Orthopedics;  Laterality: Left;  Left Total Knee Arthroplasty  . ULTRASOUND GUIDANCE FOR VASCULAR ACCESS  04/05/2018   Procedure: Ultrasound Guidance For Vascular Access;  Surgeon: Martinique, Dmarius Reeder M, MD;  Location: [facility];  Service: Cardiovascular;;     Current Outpatient Medications  Medication Sig Dispense Refill  . albuterol (PROVENTIL HFA;VENTOLIN HFA) 108 (90 Base) MCG/ACT inhaler Inhale 2 puffs into the lungs every 4 (four) hours as needed for wheezing or shortness  of breath. 1 Inhaler 5  . aluminum hydroxide-magnesium carbonate (GAVISCON) 95-358 MG/15ML SUSP Take 15 mLs by mouth daily as needed for indigestion.     [name] Kitchen aspirin EC 81 MG tablet Take 1 tablet (81 mg total) by mouth daily. 90 tablet 3  . carvedilol (COREG) 12.5 MG tablet Take 12.5 mg by mouth 2 (two) times daily.     . clopidogrel (PLAVIX) 75 MG tablet Take 1 tablet (75 mg total) by mouth daily. 90 tablet 3  . fexofenadine (ALLEGRA) 180 MG tablet Take 180 mg by mouth daily.    . furosemide (LASIX) 20 MG tablet Take 20 mg by mouth 2 (two) times daily.    . isosorbide-hydrALAZINE (BIDIL) 20-37.5 MG tablet Take 1 tablet by mouth 3 (three) times daily.    . ivabradine (CORLANOR) 7.5 MG TABS tablet Take 7.5 mg by mouth 2 (two) times daily with a meal.    . nitroGLYCERIN (NITROSTAT) 0.4 MG SL tablet Place 1 tablet (0.4 mg total) under the tongue every 5 (five) minutes as needed for chest pain. 25 tablet 3  . pantoprazole (PROTONIX) 40 MG tablet Take 1 tablet (40 mg total) by mouth daily.    . potassium chloride (K-DUR) 10 MEQ tablet Take 10 mEq by mouth daily.    . rosuvastatin (CRESTOR) 10 MG tablet Take 10 mg by mouth daily.     No current facility-administered medications for this visit.     Allergies:   Penicillins    Social History:  The patient  reports that he quit smoking about 32 years ago. His smoking use included cigarettes. He has a 20.00 pack-year smoking history. He has never used smokeless tobacco. He reports that he drinks about 5.4 oz of alcohol per week. He reports that he has current or past drug history.   Family History:  The patient's family history includes Allergies in his mother; Heart disease in his father; Lung cancer in his brother.    ROS:  Please see the history of present illness.   Otherwise, review of systems are positive for none.   All other systems are reviewed and negative.    PHYSICAL EXAM: VS:  BP 110/66   Pulse 61   Ht 6\' 2"  (1.88 m)   Wt 227 lb 6.4  oz (103.1 kg)   BMI 29.20 kg/m  , BMI Body mass index is 29.2 kg/m. GEN: Well nourished, well developed, in no acute distress  HEENT: normal  Neck: no JVD, carotid bruits, or masses Cardiac: RRR; no murmurs, rubs, or gallops,no edema  Respiratory:  clear to auscultation bilaterally, normal work of breathing GI: soft, nontender, nondistended, + BS MS: no deformity or atrophy. No groin hematoma at cath sites Skin: warm and dry, no rash Neuro:  Strength and sensation are intact Psych: euthymic mood, full affect   EKG:  EKG is  not ordered today.   Recent Labs: 12/15/2017: B Natriuretic Peptide 722.6 04/06/2018: BUN 21; Creatinine, Ser 1.48; Hemoglobin 11.7; Platelets 151; Potassium 4.4; Sodium 140    Lipid Panel No results found for: CHOL, TRIG, HDL, CHOLHDL, VLDL, LDLCALC, LDLDIRECT    Wt Readings from Last 3 Encounters:  04/27/18 227 lb 6.4 oz (103.1 kg)  04/06/18 222 lb 7.1 oz (100.9 kg)  03/29/18 228 lb 3.2 oz (103.5 kg)      Other studies Reviewed: Additional studies/ records that were reviewed today include:   Cardiac cath 12/27/17: Procedures   RIGHT/LEFT HEART CATH AND CORONARY ANGIOGRAPHY  Conclusion   Coronary angiogram 12/27/2017: Moderate to markedly elevated LVEDP at 21 mmHg.  Right dominant circulation, early bifurcation of PDA and PL branch.  Coronary calcification evident, mild disease in the PDA however the PL branch is occluded at its origin.  PDA gives collaterals to the occluded LAD.  Left main is normal and calcified.  LAD is mildly diffusely calcified with occlusion after the origin of SP-1.  Distally it is collateralized by the right coronary artery.  Circumflex is large and has mild disease.  Recommendation:  I probed the RCA stenosis after engaging the RCA with JR4  50f catheter with Couger and also Versaturn guide wire, but the lesion appeared to be tough although short occlusion. Hence I decided to abort the procedure in view of elevated LVEDP and  chronic renal insufficiency and will consider Impella assisted high risk intervention vs CABG. Will optimize medically first.  He will be discharged home today with outpatient follow-up.  Indications   Acute combined systolic and diastolic heart failure ([place]) [I50.41 (ICD-10-CM)]  Procedural Details/Technique   Technical Details Procedure performed:  Left heart catheterization including hemodynamic monitoring of the left ventricle, LV gram, selective right and left coronary arteriography. Right heart catheterization and cannulation of cardiac output and cardiac index by Fick.  Indication: Franklin Jones is a 67 y.o. male with history of hypertension, hyperlipidemia, who presents with acute systolic heart failure and OP evaluation revealed severe LV systolic dysfunction by echo. Hence is brought to the cardiac catheterization lab to evaluate the coronary anatomy for definitive diagnosis of CAD. Right heart cath done to evaluate CO and CI.  Right AC vein access for right heart and Right radial access for left heart catheterization was utilized for performing the procedure.   Technique:   A 5 French sheath introduced into right right AC vein for access under fluroscopic guidance. A 5 French Swan-Ganz catheter was advanced with balloon inflated under fluoroscopic guidance into first the right atrium followed by the right ventricle and into the pulmonary artery to pulmonary artery wedge position. Hemodynamics were obtained in a locations. 0.25" glide wire used to advance the catheter. After hemodynamics were completed, samples were taken for SaO2% measurement to be used in Fick Calculation of cardiac output and cardiac index. The catheter was then pulled back the balloon down and then completely out of the body. Hemostasis obtained by manual pressure over the access site.  Under sterile precautions using a 6 French right radial arterial access, a 6 French sheath was introduced into the right radial  artery. A 5 [place] Tig 4 catheter was advanced into the ascending aorta selective right coronary artery and left coronary artery was cannulated and angiography was performed in multiple views. The catheter was pulled back Out of the body over exchange length J-wire. Same catheter was used to perform LV Hemodynamics. Catheter exchanged out of the body  over J-Wire. NO immediate complications noted. Patient tolerated the procedure well. Contrast used: 120 mL.   Estimated blood loss <50 mL.  During this procedure the patient was administered the following to achieve and maintain moderate conscious sedation: Versed 1 mg, Fentanyl 25 mcg, while the patient's heart rate, blood pressure, and oxygen saturation were continuously monitored. The period of conscious sedation was 46 minutes, of which I was present face-to-face 100% of this time.  Coronary Findings   Diagnostic  Dominance: Right  Left Anterior Descending  Collaterals  Dist LAD filled by collaterals from Dist RCA.    Prox LAD to Mid LAD lesion 100% stenosed  Prox LAD to Mid LAD lesion is 100% stenosed. The lesion is chronically occluded.  Third Septal Branch  Collaterals  Ost 3rd Sept filled by collaterals from Post Atrio.    Right Coronary Artery  Acute Marginal Branch  Acute Mrg lesion 100% stenosed  Acute Mrg lesion is 100% stenosed. The lesion is chronically occluded.  Intervention   No interventions have been documented.  Coronary Diagrams   Diagnostic Diagram       Implants    No implant documentation for this case.  MERGE Images   Show images for CARDIAC CATHETERIZATION   Link to Procedure Log   Procedure Log    Hemo Data    Most Recent Value  Fick Cardiac Output 4.57 L/min  Fick Cardiac Output Index 1.99 (L/min)/BSA  RA A Wave 10 mmHg  RA V Wave 5 mmHg  RA Mean 5 mmHg  RV Systolic Pressure 46 mmHg  RV Diastolic Pressure 2 mmHg  RV EDP 10 mmHg  PA Systolic Pressure 53 mmHg  PA Diastolic Pressure 29 mmHg    PA Mean 39 mmHg  PW A Wave 22 mmHg  PW V Wave 21 mmHg  PW Mean 20 mmHg  AO Systolic Pressure 86 mmHg  AO Diastolic Pressure 61 mmHg  AO Mean 72 mmHg  LV Systolic Pressure 86 mmHg  LV Diastolic Pressure 8 mmHg  LV EDP 21 mmHg  Arterial Occlusion Pressure Extended Systolic Pressure 83 mmHg  Arterial Occlusion Pressure Extended Diastolic Pressure 56 mmHg  Arterial Occlusion Pressure Extended Mean Pressure 66 mmHg  Left Ventricular Apex Extended Systolic Pressure 85 mmHg  Left Ventricular Apex Extended Diastolic Pressure 6 mmHg  Left Ventricular Apex Extended EDP Pressure 17 mmHg  QP/QS 1  TPVR Index 19.63 HRUI  TSVR Index 36.23 HRUI  PVR SVR Ratio 0.28  TPVR/TSVR Ratio 0.54   Procedures   CORONARY CTO INTERVENTION  Conclusion     Prox LAD to Mid LAD lesion is 100% stenosed.  Post intervention, there is a 0% residual stenosis.  A drug-eluting stent was successfully placed using a STENT SYNERGY DES 2.25X38.  A drug-eluting stent was successfully placed using a STENT SYNERGY DES 2.5X24.   1. Successful CTO PCI of the mid LAD with DES x 2.  Plan: DAPT for one year. Anticipate DC in am. The distal RCA is diffusely diseased and relatively small. We will see how he does with this on medical therapy. Discussed with Dr. Einar Gip.       ASSESSMENT AND PLAN:  1.  CAD with ischemic CM. EF 20%. Now s/p CTO PCI of the LAD with DES x 2. On DAPT for one year. Significant improvement in symptoms.  2. Ischemic CM with chronic systolic CHF. On Bidil and Coreg. Does not appear to be volume overloaded. Unable to take ACEi, ARB, Entresto due to CKD. Plan repeat  Echo in October with Dr. Einar Gip 3. CKD. Stage 3.  4. Hypercholesterolemia. On statin. Followed by Dr. Einar Gip.  5. HTN controlled.  Patient will continue ongoing cardiac care with Dr. Einar Gip. I will see as needed..    Current medicines are reviewed at length with the patient today.  The patient does not have concerns regarding  medicines.  The following changes have been made:  See above  Labs/ tests ordered today include:   No orders of the defined types were placed in this encounter.    Signed, Mariaelena Cade Martinique, MD  04/27/2018 10:47 AM    [facility] [address] [place], [place], [address] Phone [phone], Fax [phone]

## 2018-04-27 ENCOUNTER — Encounter: Payer: Self-pay | Admitting: Cardiology

## 2018-04-27 ENCOUNTER — Ambulatory Visit (INDEPENDENT_AMBULATORY_CARE_PROVIDER_SITE_OTHER): Payer: Medicare Other | Admitting: Cardiology

## 2018-04-27 VITALS — BP 110/66 | HR 61 | Ht 74.0 in | Wt 227.4 lb

## 2018-04-27 DIAGNOSIS — I255 Ischemic cardiomyopathy: Secondary | ICD-10-CM

## 2018-04-27 DIAGNOSIS — I209 Angina pectoris, unspecified: Secondary | ICD-10-CM | POA: Diagnosis not present

## 2018-04-27 NOTE — Patient Instructions (Signed)
Continue your current therapy and keep follow up with Dr Einar Gip.

## 2018-06-21 DIAGNOSIS — I509 Heart failure, unspecified: Secondary | ICD-10-CM | POA: Diagnosis not present

## 2018-06-21 DIAGNOSIS — H6692 Otitis media, unspecified, left ear: Secondary | ICD-10-CM | POA: Diagnosis not present

## 2018-06-21 DIAGNOSIS — Z6829 Body mass index (BMI) 29.0-29.9, adult: Secondary | ICD-10-CM | POA: Diagnosis not present

## 2018-06-21 DIAGNOSIS — I2510 Atherosclerotic heart disease of native coronary artery without angina pectoris: Secondary | ICD-10-CM | POA: Diagnosis not present

## 2018-06-21 DIAGNOSIS — Z299 Encounter for prophylactic measures, unspecified: Secondary | ICD-10-CM | POA: Diagnosis not present

## 2018-06-21 DIAGNOSIS — Z87891 Personal history of nicotine dependence: Secondary | ICD-10-CM | POA: Diagnosis not present

## 2018-06-21 DIAGNOSIS — J61 Pneumoconiosis due to asbestos and other mineral fibers: Secondary | ICD-10-CM | POA: Diagnosis not present

## 2018-07-05 ENCOUNTER — Encounter: Payer: Self-pay | Admitting: Emergency Medicine

## 2018-07-05 ENCOUNTER — Ambulatory Visit (INDEPENDENT_AMBULATORY_CARE_PROVIDER_SITE_OTHER): Payer: No Typology Code available for payment source | Admitting: Emergency Medicine

## 2018-07-05 VITALS — BP 126/74 | HR 60

## 2018-07-05 DIAGNOSIS — Z77090 Contact with and (suspected) exposure to asbestos: Secondary | ICD-10-CM | POA: Diagnosis not present

## 2018-07-05 DIAGNOSIS — J301 Allergic rhinitis due to pollen: Secondary | ICD-10-CM

## 2018-07-05 DIAGNOSIS — J4520 Mild intermittent asthma, uncomplicated: Secondary | ICD-10-CM

## 2018-07-05 NOTE — Assessment & Plan Note (Signed)
A bit more symptomatic recently.  He just added fluticasone nasal spray (OTC) to his daily Allegra

## 2018-07-05 NOTE — Patient Instructions (Signed)
Your spirometry today and CXR from February are both stable (to improved).  We will plan to follow up in 1 year with PFT on the same day We will plan to repeat CT scan of the chest in 06/2019.  Please call us if you develop any new respiratory symptoms.

## 2018-07-05 NOTE — Assessment & Plan Note (Signed)
Mild asthma.  He rarely has symptoms and is actually never used his albuterol.  We will refill it so he can have it at the ready.

## 2018-07-05 NOTE — Progress Notes (Signed)
HPI:  ROV 07/05/18 --[name] is 55 with a history of former tobacco use and documented asbestos exposure when he worked for [employer].  Some pleural scarring suspected on chest imaging but we have been following stable CT scans of the chest and chest x-rays.  Mild obstruction, FEV1 2.83 L (73% predicted) on most recent PFT 06/14/18.  He has never really responded or benefited from albuterol, still has it available. He is having some increased nasal gtt over the last couple weeks. He is using allegra, just started flonase yesterday.   Repeat spirometry today shows FVC 3.7 L (72% predicted), FEV1 3.0 L (79% predicted), ratio 82%   Since last visit he has been worked up for a cardia myopathy and was found to have coronary disease, required cath by Dr. Einar Gip and then again in June by Dr Martinique for [facility]. He has more energy, his breathing is better. He remains on lasix.      Vitals:   07/05/18 1331  BP: 126/74  Pulse: 60  SpO2: 93%   Gen: Pleasant, well-nourished, in no distress,  normal affect  ENT: No lesions,  mouth clear,  oropharynx clear, no postnasal drip  Neck: No JVD, no stridor  Lungs: No use of accessory muscles, clear B   Cardiovascular: RRR, heart sounds normal, no murmur or gallops, no peripheral edema  Musculoskeletal: No deformities, no cyanosis or clubbing  Neuro: alert, non focal  Skin: Warm, no lesions or rashes  PULMONARY FUNCTON TEST 04/09/2010 04/28/2011 05/04/2012 06/06/2013  FVC 4.98 4.51 4.42 4.27  FEV1 3.68 3.2 3.24 3.1  FEV1/FVC 73.9 71 73.3 72.6  FVC  % Predicted 92 87 85 80  FEV % Predicted 90 88 90 78  FeF 25-75 2.66 2.01 2.32 4.09  FeF 25-75 % Predicted 3.32 3.29 3.25 5.04     ASSESSMENT/PLAN:  Asthma Mild asthma.  He rarely has symptoms and is actually never used his albuterol.  We will refill it so he can have it at the ready.  Allergic rhinitis A bit more symptomatic recently.  He just added fluticasone nasal spray (OTC) to his daily  Allegra  Asbestos exposure Known asbestos exposure.  His spirometry today is stable.  He had a chest x-ray in February which I reviewed and which does not show any new findings.  I do not see any evidence of pleural plaquing.  I think we can defer a CT chest at this time, wait until September 2020.  We will follow his imaging and PFT at that time.  He knows to get the flu shot this fall.  He will call us for any changes in his respiratory status which might prompt [place] to perform his imaging sooner.  Baltazar Apo, MD, PhD 07/05/2018, 1:52 PM [facility] [phone] or if no answer [phone]

## 2018-07-05 NOTE — Assessment & Plan Note (Signed)
Known asbestos exposure.  His spirometry today is stable.  He had a chest x-ray in February which I reviewed and which does not show any new findings.  I do not see any evidence of pleural plaquing.  I think we can defer a CT chest at this time, wait until September 2020.  We will follow his imaging and PFT at that time.  He knows to get the flu shot this fall.  He will call us for any changes in his respiratory status which might prompt [place] to perform his imaging sooner.

## 2018-07-18 DIAGNOSIS — I255 Ischemic cardiomyopathy: Secondary | ICD-10-CM | POA: Diagnosis not present

## 2018-07-19 DIAGNOSIS — Z1331 Encounter for screening for depression: Secondary | ICD-10-CM | POA: Diagnosis not present

## 2018-07-19 DIAGNOSIS — Z125 Encounter for screening for malignant neoplasm of prostate: Secondary | ICD-10-CM | POA: Diagnosis not present

## 2018-07-19 DIAGNOSIS — R5383 Other fatigue: Secondary | ICD-10-CM | POA: Diagnosis not present

## 2018-07-19 DIAGNOSIS — Z299 Encounter for prophylactic measures, unspecified: Secondary | ICD-10-CM | POA: Diagnosis not present

## 2018-07-19 DIAGNOSIS — E7800 Pure hypercholesterolemia, unspecified: Secondary | ICD-10-CM | POA: Diagnosis not present

## 2018-07-19 DIAGNOSIS — Z0000 Encounter for general adult medical examination without abnormal findings: Secondary | ICD-10-CM | POA: Diagnosis not present

## 2018-07-19 DIAGNOSIS — Z1339 Encounter for screening examination for other mental health and behavioral disorders: Secondary | ICD-10-CM | POA: Diagnosis not present

## 2018-07-19 DIAGNOSIS — Z1211 Encounter for screening for malignant neoplasm of colon: Secondary | ICD-10-CM | POA: Diagnosis not present

## 2018-07-19 DIAGNOSIS — Z79899 Other long term (current) drug therapy: Secondary | ICD-10-CM | POA: Diagnosis not present

## 2018-07-19 DIAGNOSIS — Z23 Encounter for immunization: Secondary | ICD-10-CM | POA: Diagnosis not present

## 2018-07-19 DIAGNOSIS — Z7189 Other specified counseling: Secondary | ICD-10-CM | POA: Diagnosis not present

## 2018-07-19 DIAGNOSIS — Z6829 Body mass index (BMI) 29.0-29.9, adult: Secondary | ICD-10-CM | POA: Diagnosis not present

## 2018-07-19 DIAGNOSIS — I509 Heart failure, unspecified: Secondary | ICD-10-CM | POA: Diagnosis not present

## 2018-07-27 DIAGNOSIS — E7800 Pure hypercholesterolemia, unspecified: Secondary | ICD-10-CM | POA: Diagnosis not present

## 2018-07-27 DIAGNOSIS — I255 Ischemic cardiomyopathy: Secondary | ICD-10-CM | POA: Diagnosis not present

## 2018-07-27 DIAGNOSIS — I5042 Chronic combined systolic (congestive) and diastolic (congestive) heart failure: Secondary | ICD-10-CM | POA: Diagnosis not present

## 2018-07-27 DIAGNOSIS — I25118 Atherosclerotic heart disease of native coronary artery with other forms of angina pectoris: Secondary | ICD-10-CM | POA: Diagnosis not present

## 2018-09-21 DIAGNOSIS — H25013 Cortical age-related cataract, bilateral: Secondary | ICD-10-CM | POA: Diagnosis not present

## 2018-09-21 DIAGNOSIS — H2512 Age-related nuclear cataract, left eye: Secondary | ICD-10-CM | POA: Diagnosis not present

## 2018-09-21 DIAGNOSIS — H2513 Age-related nuclear cataract, bilateral: Secondary | ICD-10-CM | POA: Diagnosis not present

## 2018-09-21 DIAGNOSIS — H25012 Cortical age-related cataract, left eye: Secondary | ICD-10-CM | POA: Diagnosis not present

## 2018-09-21 DIAGNOSIS — H35372 Puckering of macula, left eye: Secondary | ICD-10-CM | POA: Diagnosis not present

## 2018-09-21 DIAGNOSIS — H35033 Hypertensive retinopathy, bilateral: Secondary | ICD-10-CM | POA: Diagnosis not present

## 2018-10-18 HISTORY — PX: CATARACT EXTRACTION, BILATERAL: SHX1313

## 2018-10-31 DIAGNOSIS — H2511 Age-related nuclear cataract, right eye: Secondary | ICD-10-CM | POA: Diagnosis not present

## 2018-10-31 DIAGNOSIS — H25811 Combined forms of age-related cataract, right eye: Secondary | ICD-10-CM | POA: Diagnosis not present

## 2018-11-22 ENCOUNTER — Other Ambulatory Visit: Payer: Self-pay | Admitting: Cardiology

## 2018-12-11 DIAGNOSIS — H2512 Age-related nuclear cataract, left eye: Secondary | ICD-10-CM | POA: Diagnosis not present

## 2018-12-11 DIAGNOSIS — H25012 Cortical age-related cataract, left eye: Secondary | ICD-10-CM | POA: Diagnosis not present

## 2018-12-20 ENCOUNTER — Other Ambulatory Visit: Payer: Self-pay

## 2018-12-20 DIAGNOSIS — I5022 Chronic systolic (congestive) heart failure: Secondary | ICD-10-CM

## 2018-12-20 MED ORDER — IVABRADINE HCL 5 MG PO TABS: 7.5000 mg | ORAL_TABLET | Freq: Two times a day (BID) | ORAL | Status: DC

## 2018-12-22 ENCOUNTER — Other Ambulatory Visit: Payer: Self-pay

## 2018-12-26 DIAGNOSIS — H2512 Age-related nuclear cataract, left eye: Secondary | ICD-10-CM | POA: Diagnosis not present

## 2018-12-26 DIAGNOSIS — H25812 Combined forms of age-related cataract, left eye: Secondary | ICD-10-CM | POA: Diagnosis not present

## 2018-12-28 ENCOUNTER — Other Ambulatory Visit: Payer: Self-pay

## 2019-01-01 ENCOUNTER — Telehealth: Payer: Self-pay | Admitting: Cardiology

## 2019-01-01 DIAGNOSIS — I5022 Chronic systolic (congestive) heart failure: Secondary | ICD-10-CM

## 2019-01-01 DIAGNOSIS — I255 Ischemic cardiomyopathy: Secondary | ICD-10-CM

## 2019-01-01 MED ORDER — IVABRADINE HCL 7.5 MG PO TABS: 7.5000 mg | ORAL_TABLET | Freq: Two times a day (BID) | ORAL | 11 refills | Status: DC

## 2019-01-04 ENCOUNTER — Other Ambulatory Visit: Payer: Self-pay

## 2019-01-04 DIAGNOSIS — R609 Edema, unspecified: Secondary | ICD-10-CM

## 2019-01-04 DIAGNOSIS — I25110 Atherosclerotic heart disease of native coronary artery with unstable angina pectoris: Secondary | ICD-10-CM

## 2019-01-04 DIAGNOSIS — I509 Heart failure, unspecified: Secondary | ICD-10-CM

## 2019-01-04 MED ORDER — POTASSIUM CHLORIDE ER 10 MEQ PO TBCR
10.0000 meq | EXTENDED_RELEASE_TABLET | Freq: Every day | ORAL | 3 refills | Status: DC
Start: 2019-01-04 — End: 2019-08-20

## 2019-01-04 MED ORDER — FUROSEMIDE 20 MG PO TABS: 20.0000 mg | ORAL_TABLET | Freq: Two times a day (BID) | ORAL | 3 refills | Status: DC

## 2019-01-04 MED ORDER — ROSUVASTATIN CALCIUM 10 MG PO TABS: 10.0000 mg | ORAL_TABLET | Freq: Every day | ORAL | 3 refills | Status: DC

## 2019-01-11 ENCOUNTER — Other Ambulatory Visit: Payer: Self-pay | Admitting: Cardiology

## 2019-01-25 ENCOUNTER — Other Ambulatory Visit: Payer: Self-pay | Admitting: Cardiology

## 2019-01-25 DIAGNOSIS — I25118 Atherosclerotic heart disease of native coronary artery with other forms of angina pectoris: Secondary | ICD-10-CM | POA: Diagnosis not present

## 2019-01-25 DIAGNOSIS — E7800 Pure hypercholesterolemia, unspecified: Secondary | ICD-10-CM | POA: Diagnosis not present

## 2019-01-26 LAB — CBC WITH DIFFERENTIAL/PLATELET
Basophils Absolute: 0.1 10*3/uL (ref 0.0–0.2)
Basos: 1 %
EOS (ABSOLUTE): 0.3 10*3/uL (ref 0.0–0.4)
Eos: 5 %
Hematocrit: 41.8 % (ref 37.5–51.0)
Hemoglobin: 14.2 g/dL (ref 13.0–17.7)
Immature Grans (Abs): 0 10*3/uL (ref 0.0–0.1)
Immature Granulocytes: 0 %
Lymphocytes Absolute: 1.1 10*3/uL (ref 0.7–3.1)
Lymphs: 18 %
MCH: 30.1 pg (ref 26.6–33.0)
MCHC: 34 g/dL (ref 31.5–35.7)
MCV: 89 fL (ref 79–97)
Monocytes Absolute: 0.9 10*3/uL (ref 0.1–0.9)
Monocytes: 15 %
Neutrophils Absolute: 3.7 10*3/uL (ref 1.4–7.0)
Neutrophils: 61 %
Platelets: 188 10*3/uL (ref 150–450)
RBC: 4.72 x10E6/uL (ref 4.14–5.80)
RDW: 13.8 % (ref 11.6–15.4)
WBC: 6.1 10*3/uL (ref 3.4–10.8)

## 2019-01-26 LAB — COMPREHENSIVE METABOLIC PANEL
ALT: 14 IU/L (ref 0–44)
AST: 22 IU/L (ref 0–40)
Albumin/Globulin Ratio: 1.3 (ref 1.2–2.2)
Albumin: 4.1 g/dL (ref 3.8–4.8)
Alkaline Phosphatase: 89 IU/L (ref 39–117)
BUN/Creatinine Ratio: 11 (ref 10–24)
BUN: 19 mg/dL (ref 8–27)
Bilirubin Total: 0.5 mg/dL (ref 0.0–1.2)
CO2: 23 mmol/L (ref 20–29)
Calcium: 9.6 mg/dL (ref 8.6–10.2)
Chloride: 103 mmol/L (ref 96–106)
Creatinine, Ser: 1.77 mg/dL — ABNORMAL HIGH (ref 0.76–1.27)
GFR calc Af Amer: 45 mL/min/{1.73_m2} — ABNORMAL LOW (ref >59)
GFR calc non Af Amer: 39 mL/min/{1.73_m2} — ABNORMAL LOW (ref >59)
Globulin, Total: 3.2 g/dL (ref 1.5–4.5)
Glucose: 97 mg/dL (ref 65–99)
Potassium: 5.1 mmol/L (ref 3.5–5.2)
Sodium: 141 mmol/L (ref 134–144)
Total Protein: 7.3 g/dL (ref 6.0–8.5)

## 2019-01-26 LAB — LIPID PANEL W/O CHOL/HDL RATIO
Cholesterol, Total: 110 mg/dL (ref 100–199)
HDL: 43 mg/dL (ref >39)
LDL Calculated: 49 mg/dL (ref 0–99)
Triglycerides: 91 mg/dL (ref 0–149)
VLDL Cholesterol Cal: 18 mg/dL (ref 5–40)

## 2019-02-01 ENCOUNTER — Ambulatory Visit (INDEPENDENT_AMBULATORY_CARE_PROVIDER_SITE_OTHER): Payer: Medicare Other | Admitting: Cardiology

## 2019-02-01 ENCOUNTER — Other Ambulatory Visit: Payer: Self-pay

## 2019-02-01 ENCOUNTER — Encounter: Payer: Self-pay | Admitting: Cardiology

## 2019-02-01 VITALS — BP 115/74 | HR 52 | Ht 76.0 in | Wt 224.5 lb

## 2019-02-01 DIAGNOSIS — R0609 Other forms of dyspnea: Secondary | ICD-10-CM

## 2019-02-01 DIAGNOSIS — I5032 Chronic diastolic (congestive) heart failure: Secondary | ICD-10-CM | POA: Diagnosis not present

## 2019-02-01 DIAGNOSIS — I447 Left bundle-branch block, unspecified: Secondary | ICD-10-CM

## 2019-02-01 DIAGNOSIS — I25110 Atherosclerotic heart disease of native coronary artery with unstable angina pectoris: Secondary | ICD-10-CM | POA: Diagnosis not present

## 2019-02-01 DIAGNOSIS — I255 Ischemic cardiomyopathy: Secondary | ICD-10-CM

## 2019-02-01 DIAGNOSIS — E7800 Pure hypercholesterolemia, unspecified: Secondary | ICD-10-CM | POA: Diagnosis not present

## 2019-02-01 DIAGNOSIS — N183 Chronic kidney disease, stage 3 unspecified: Secondary | ICD-10-CM

## 2019-02-01 DIAGNOSIS — R0600 Dyspnea, unspecified: Secondary | ICD-10-CM

## 2019-02-01 HISTORY — DX: Dyspnea, unspecified: R06.00

## 2019-02-01 HISTORY — DX: Other forms of dyspnea: R06.09

## 2019-02-01 MED ORDER — CLOPIDOGREL BISULFATE 75 MG PO TABS: 75.0000 mg | ORAL_TABLET | Freq: Every day | ORAL | 2 refills | Status: DC

## 2019-02-01 MED ORDER — NITROGLYCERIN 0.4 MG SL SUBL: 0.4000 mg | SUBLINGUAL_TABLET | SUBLINGUAL | 3 refills | Status: DC | PRN

## 2019-02-01 NOTE — Progress Notes (Signed)
Subjective:  Primary Physician/Referring:  Glenda Chroman, MD  Patient ID: Franklin Jones, male    DOB: April 29, 1951, 68 y.o.   MRN: [id number]  Chief Complaint  Patient presents with  . Coronary Artery Disease    6 months, labs    HPI: Franklin Jones  is a 68 y.o. male  with  coronary artery disease, complete occlusion of proximal LAD to mid LAD and acute marginal of RCA by coronary angiography on 12/28/2017 at which time his EF was 20-25% and initially presented with florid CHF.His past medical history significant for hypertension, stage III chronic kidney disease and hyperlipidemia.   Patient is here on a 42-monthfollow-up visit of ischemic cardiomyopathy and coronary artery disease, underwent successful revascularization of the LAD on 04/08/2018, the right coronary artery was actually open during angiography (previously probed). It is small vessel and hence continued medical therapy is indicated.   Past Medical History:  Diagnosis Date  . Acute on chronic combined systolic and diastolic CHF (congestive heart failure) ([facility] 12/15/2017  . Acute on chronic heart failure ([facility] 12/15/2017  . Allergic rhinitis   . Arthritis    "qwhere" (6//19/2019)  . Asbestosis(501)    followed by Dr BBarkley Boards "on monitoring only; stable x years" (04/05/2018)  . Basal cell carcinoma    "face; head" (04/05/2018)  . Degenerative arthritis of knee 11/19/2011  . Dyspnea on exertion 02/01/2019  . GERD (gastroesophageal reflux disease)   . High cholesterol   . History of asbestos exposure    followed by Dr BWest Carbo states stable years- last CT chest 7/12 in Epic  . Hypertension   . LBBB (left bundle branch block) 01/31/2018  . Shortness of breath    SOME SOB BUT CAN CLIMB FLIGHT OF STAIRS  . Squamous carcinoma    "back of neck" (04/05/2018)    Past Surgical History:  Procedure Laterality Date  . CATARACT EXTRACTION, BILATERAL Bilateral 2020  . COLONOSCOPY    . COLONOSCOPY N/A 08/22/2014   Procedure: COLONOSCOPY;  Surgeon: NRogene Houston MD;  [facility];  Service: Endoscopy;  Laterality: N/A;  830  . CORONARY ANGIOPLASTY WITH STENT PLACEMENT  04/05/2018   CTO PCI of the mid LAD with DES x 2.  . CORONARY CTO INTERVENTION N/A 04/05/2018   Procedure: CORONARY CTO INTERVENTION;  Surgeon: JMartinique Peter M, MD;  [facility];  Service: Cardiovascular;  Laterality: N/A;  . FINGER AMPUTATION Left 2009   traumatic injury left small finger with amputation  . JOINT REPLACEMENT    . KNEE ARTHROPLASTY  11/19/2011   Procedure: COMPUTER ASSISTED TOTAL KNEE ARTHROPLASTY;  Surgeon: CMcarthur Rossetti MD;  Location: [facility];  Service: Orthopedics;  Laterality: Right;  Right Total Knee Arthroplasty  . MASS EXCISION Left 03/12/2014   Procedure: EXCISION LEFT NECK SKIN MASS;  Surgeon: EGayland Curry MD;  [place] [facility]  Service: General;  Laterality: Left;  . RIGHT/LEFT HEART CATH AND CORONARY ANGIOGRAPHY N/A 12/27/2017   Procedure: RIGHT/LEFT HEART CATH AND CORONARY ANGIOGRAPHY;  Surgeon: GAdrian Prows MD;  [facility];  Service: Cardiovascular;  Laterality: N/A;  . TOTAL KNEE ARTHROPLASTY  08/18/2012   Procedure: TOTAL KNEE ARTHROPLASTY;  Surgeon: CMcarthur Rossetti MD;  Location: [facility];  Service: Orthopedics;  Laterality: Left;  Left Total Knee Arthroplasty  . ULTRASOUND GUIDANCE FOR VASCULAR ACCESS  04/05/2018   Procedure: Ultrasound Guidance For Vascular Access;  Surgeon: JMartinique Peter M,  MD;  Location: [facility];  Service: Cardiovascular;;    Social History   Socioeconomic History  . Marital status: Married    Spouse name: Not on file  . Number of children: Not on file  . Years of education: Not on file  . Highest education level: Not on file  Occupational History  . Occupation: Horticulturist, commercial: [employer]  . Financial resource strain: Not on file  . Food insecurity:     Worry: Not on file    Inability: Not on file  . Transportation needs:    Medical: Not on file    Non-medical: Not on file  Tobacco Use  . Smoking status: Former Smoker    Packs/day: 1.00    Years: 20.00    Pack years: 20.00    Types: Cigarettes    Last attempt to quit: 10/18/1985    Years since quitting: 33.3  . Smokeless tobacco: Never Used  Substance and Sexual Activity  . Alcohol use: Yes    Alcohol/week: 9.0 standard drinks    Types: 3 Cans of beer, 6 Standard drinks or equivalent per week  . Drug use: Not Currently  . Sexual activity: Not on file  Lifestyle  . Physical activity:    Days per week: Not on file    Minutes per session: Not on file  . Stress: Not on file  Relationships  . Social connections:    Talks on phone: Not on file    Gets together: Not on file    Attends religious service: Not on file    Active member of club or organization: Not on file    Attends meetings of clubs or organizations: Not on file    Relationship status: Not on file  . Intimate partner violence:    Fear of current or ex partner: Not on file    Emotionally abused: Not on file    Physically abused: Not on file    Forced sexual activity: Not on file  Other Topics Concern  . Not on file  Social History Narrative  . Not on file    Current Outpatient Medications on File Prior to Visit  Medication Sig Dispense Refill  . albuterol (PROVENTIL HFA;VENTOLIN HFA) 108 (90 Base) MCG/ACT inhaler Inhale 2 puffs into the lungs every 4 (four) hours as needed for wheezing or shortness of breath. 1 Inhaler 5  . aluminum hydroxide-magnesium carbonate (GAVISCON) 95-358 MG/15ML SUSP Take 15 mLs by mouth daily as needed for indigestion.     [name] Kitchen aspirin EC 81 MG tablet Take 1 tablet (81 mg total) by mouth daily. 90 tablet 3  . BIDIL 20-37.5 MG tablet TAKE ONE TABLET BY MOUTH THREE TIMES A DAY  270 tablet 1  . carvedilol (COREG) 12.5 MG tablet Take 12.5 mg by mouth 2 (two) times daily.     . fexofenadine  (ALLEGRA) 180 MG tablet Take 180 mg by mouth daily.    . furosemide (LASIX) 20 MG tablet Take 1 tablet (20 mg total) by mouth 2 (two) times daily. (Patient taking differently: Take 20 mg by mouth daily. ) 90 tablet 3  . ivabradine (CORLANOR) 7.5 MG TABS tablet Take 1 tablet (7.5 mg total) by mouth 2 (two) times daily with a meal. 60 tablet 11  . pantoprazole (PROTONIX) 40 MG tablet Take 1 tablet (40 mg total) by mouth daily.    . potassium chloride (K-DUR) 10 MEQ tablet Take 1 tablet (10 mEq  total) by mouth daily. 90 tablet 3  . rosuvastatin (CRESTOR) 10 MG tablet Take 1 tablet (10 mg total) by mouth daily. 90 tablet 3   No current facility-administered medications on file prior to visit.     Review of Systems  Constitution: Negative for chills, decreased appetite, malaise/fatigue and weight gain.  Cardiovascular: Positive for dyspnea on exertion (stable). Negative for chest pain, leg swelling, paroxysmal nocturnal dyspnea and syncope.  Endocrine: Negative for cold intolerance.  Hematologic/Lymphatic: Does not bruise/bleed easily.  Musculoskeletal: Positive for muscle cramps (at night sometimes). Negative for joint swelling.  Gastrointestinal: Negative for abdominal pain, anorexia and change in bowel habit.  Neurological: Negative for headaches and light-headedness.  Psychiatric/Behavioral: Negative for depression and substance abuse.  All other systems reviewed and are negative.      Objective:  Blood pressure 115/74, pulse (!) 52, height 6' 4"  (1.93 m), weight 224 lb 8 oz (101.8 kg). Body mass index is 27.33 kg/m.  Physical Exam  Constitutional: He is oriented to person, place, and time. He appears well-nourished. No distress.  Eyes: Conjunctivae are normal.  Neck: Neck supple.  Pulmonary/Chest: Effort normal.  Musculoskeletal:        General: No edema.  Neurological: He is alert and oriented to person, place, and time.  Psychiatric: He has a normal mood and affect.     Radiology: No results found.  Laboratory Examination:  01/26/2018: Cholesterol 123, triglycerides 110, HDL 37, LDL 64. Creatinine 1.8, EGFR 38/44, potassium 4.8, alkaline phosphate 205, CMP otherwise normal. BNP 437.3. 01/06/2018: Glucose 119, creatinine 1.98, EGFR 34/39, potassium 4.8, alkaline phosphatase 197, CMP otherwise normal. BNP 683.2. 12/21/2017: Hemoglobin 12.3, hematocrit 36.7, CBC otherwise normal. Creatinine 2.0, EGFR 34/39, potassium 5.2  CMP Latest Ref Rng & Units 01/25/2019 04/06/2018 03/29/2018  Glucose 65 - 99 mg/dL 97 134(H) 80  BUN 8 - 27 mg/dL 19 21(H) 23  Creatinine 0.76 - 1.27 mg/dL 1.77(H) 1.48(H) 1.55(H)  Sodium 134 - 144 mmol/L 141 140 141  Potassium 3.5 - 5.2 mmol/L 5.1 4.4 4.8  Chloride 96 - 106 mmol/L 103 108 100  CO2 20 - 29 mmol/L 23 26 27   Calcium 8.6 - 10.2 mg/dL 9.6 8.8(L) 9.6  Total Protein 6.0 - 8.5 g/dL 7.3 - -  Total Bilirubin 0.0 - 1.2 mg/dL 0.5 - -  Alkaline Phos 39 - 117 IU/L 89 - -  AST 0 - 40 IU/L 22 - -  ALT 0 - 44 IU/L 14 - -   CBC Latest Ref Rng & Units 01/25/2019 04/06/2018 03/29/2018  WBC 3.4 - 10.8 x10E3/uL 6.1 7.0 7.8  Hemoglobin 13.0 - 17.7 g/dL 14.2 11.7(L) 12.9(L)  Hematocrit 37.5 - 51.0 % 41.8 36.5(L) 39.0  Platelets 150 - 450 x10E3/uL 188 151 214   Lipid Panel     Component Value Date/Time   CHOL 110 01/25/2019 0816   TRIG 91 01/25/2019 0816   HDL 43 01/25/2019 0816   LDLCALC 49 01/25/2019 0816   HEMOGLOBIN A1C No results found for: HGBA1C, MPG TSH No results for input(s): TSH in the last 8760 hours.   Cardiac studies:   Echocardiogram 07/18/2018: Left ventricle cavity is normal in size. Moderate concentric hypertrophy of the left ventricle. Mild decrease in global wall motion. Visual EF is 45-50%. Doppler evidence of grade I (impaired) diastolic dysfunction, normal LAP. Calculated EF 50%.  Mild tricuspid regurgitation. Estimated pulmonary artery systolic pressure 26 mmHg.  Mild pulmonic regurgitation. Compared tp previous  study on 03/22/2018, LVEF has significantly improved. Visual  EF is 45-50%.  Compared tp previous study on 03/22/2018, LVEF has significantly improved from 20%.  Dr. P Martinique: Coronary angiogram 04/08/2018 Successful CTO PCI of the mid LAD with DES x 2. (Synergy 2.5 x 24 and 2.25 x 38 mm DES. Medical therapy for CTO RCA, flow improved since prior angiography on 12/28/2017. Diffusely diseased RCA Mild disease Cx.  Assessment:    Coronary artery disease involving native coronary artery of native heart with unstable angina pectoris ([place]) 04/08/2018 CTO PCI of the mid LAD with DES x 2. - Plan: clopidogrel (PLAVIX) 75 MG tablet, nitroGLYCERIN (NITROSTAT) 0.4 MG SL tablet  Ischemic cardiomyopathy  Chronic diastolic CHF (congestive heart failure) (HCC)  Dyspnea on exertion  Hypercholesteremia  Chronic renal failure, stage 3 (moderate) (HCC)  LBBB (left bundle branch block)  EKG   04/17/2018: Marked sinus bradycardia at rate of 43 bpm, normal axis. Nonspecific T abnormality, cannot exclude anterolateral ischemia. Compared to prior EKG LBBB no longer present.  Recommendations:    He is presently doing well and appears well on the video conference meeting.  With regard to coronary artery disease, although he has not had any worsening dyspnea or leg edema and no clinical evidence of heart failure, I would like to continue Plavix for at least 2 years, he is nearly completing Plavix for a year in June.  This is due to diffuse disease in his coronaries.  Renal function is remained normal for his levels, I reviewed his labs, lipids are under excellent control.  Hence I did not make any changes to his medication.  His nitroglycerin was refilled.  He is advised to contact me immediately if he were to develop any leg swelling, worsening dyspnea or he develops recurrent chest discomfort with use of nitroglycerin.  I'd like to see him back in 6 months.  This is a virtual visit in view of COVID 19.  Adrian Prows, MD, [facility] 02/01/2019, 9:58 AM [facility] [place] Pager: [phone] Office: [phone] If no answer Cell [phone]

## 2019-03-15 ENCOUNTER — Other Ambulatory Visit (INDEPENDENT_AMBULATORY_CARE_PROVIDER_SITE_OTHER): Payer: Self-pay | Admitting: Internal Medicine

## 2019-03-15 DIAGNOSIS — K219 Gastro-esophageal reflux disease without esophagitis: Secondary | ICD-10-CM

## 2019-03-30 ENCOUNTER — Other Ambulatory Visit: Payer: Self-pay

## 2019-03-30 MED ORDER — CARVEDILOL 12.5 MG PO TABS: 12.5000 mg | ORAL_TABLET | Freq: Two times a day (BID) | ORAL | 1 refills | Status: DC

## 2019-04-12 ENCOUNTER — Other Ambulatory Visit: Payer: Self-pay | Admitting: Cardiology

## 2019-04-12 DIAGNOSIS — I25110 Atherosclerotic heart disease of native coronary artery with unstable angina pectoris: Secondary | ICD-10-CM

## 2019-04-29 NOTE — Telephone Encounter (Signed)
Medication orders.

## 2019-05-30 IMAGING — RF DG ESOPHAGUS
10 of 12 series · 14 of 24 positions shown · non-contrast
Comparison: Chest CT 06/22/2017

CLINICAL DATA: Gastroesophageal reflux disease and shortness of
breath for 4-5 years.

EXAM:
ESOPHOGRAM / BARIUM SWALLOW / BARIUM TABLET STUDY
TECHNIQUE: Combined double contrast and single contrast examination performed
using effervescent crystals, thick barium liquid, and thin barium
liquid. The patient was observed with fluoroscopy swallowing a 13 mm
barium sulphate tablet.
FLUOROSCOPY TIME:  Fluoroscopy Time:  3 minutes and 0 seconds
Radiation Exposure Index (if provided by the fluoroscopic device):
Not applicable.
Number of Acquired Spot Images: 0

[Series 1: cp_standard · 0.17mm/px · 2 of 59 frames shown (1 of 10)]
[frame 9/59]
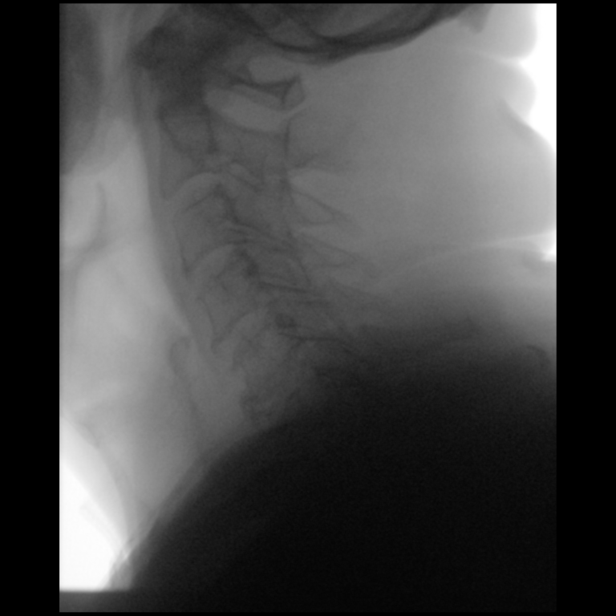
[frame 51/59]
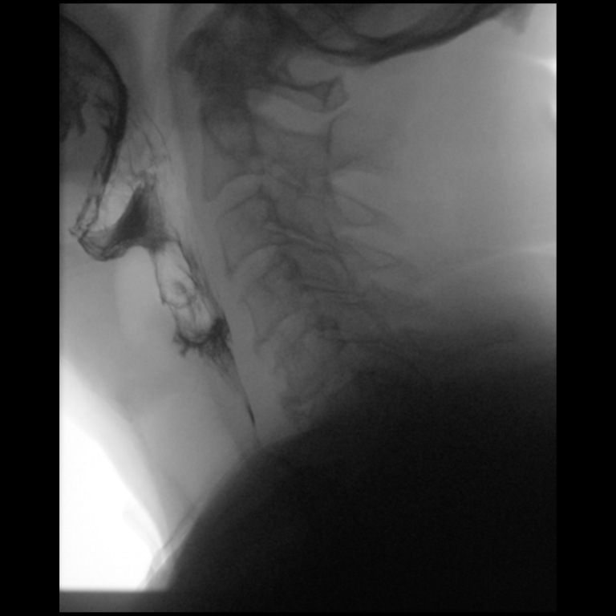

[Series 4: cp_standard · 0.26mm/px · 2 of 94 frames shown (2 of 10)]
[frame 15/94]
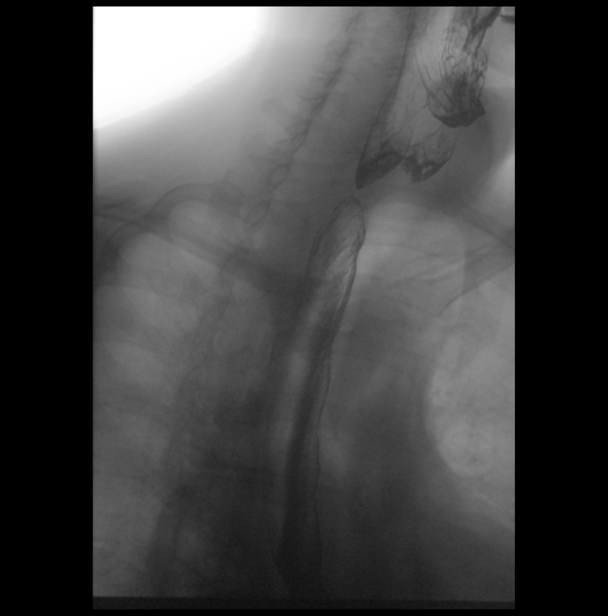
[frame 80/94]
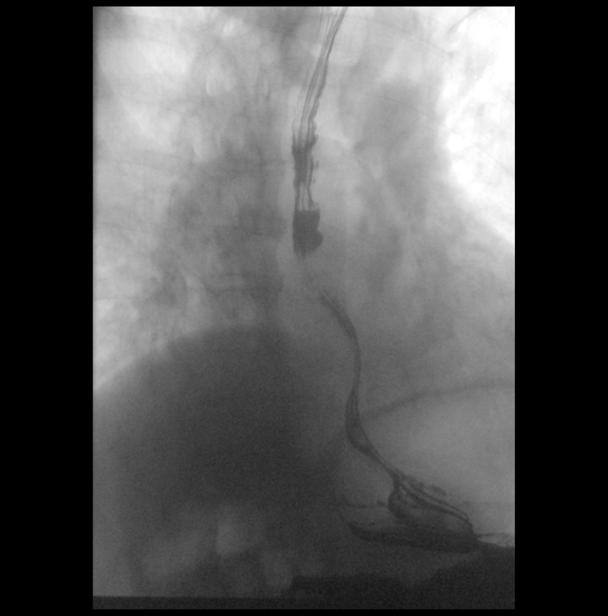

[Series 5: cp_standard · 0.26mm/px · 1 of 142 frames shown (3 of 10)]
[frame 72/142]
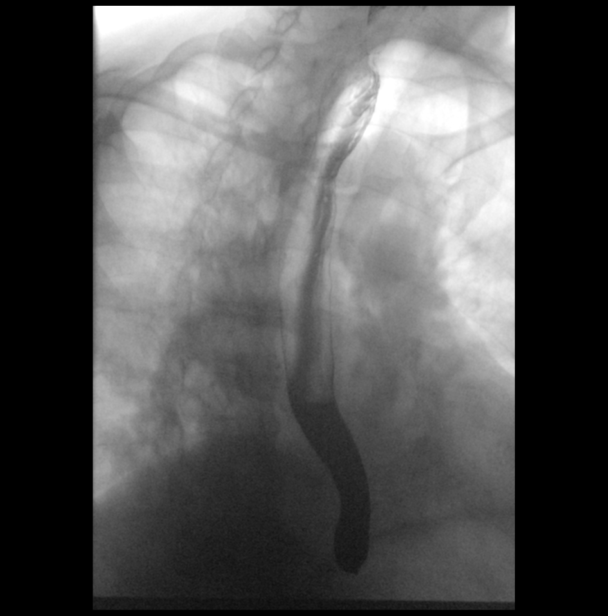

[Series 6: cp_standard · 0.26mm/px · 2 of 143 frames shown (4 of 10)]
[frame 22/143]
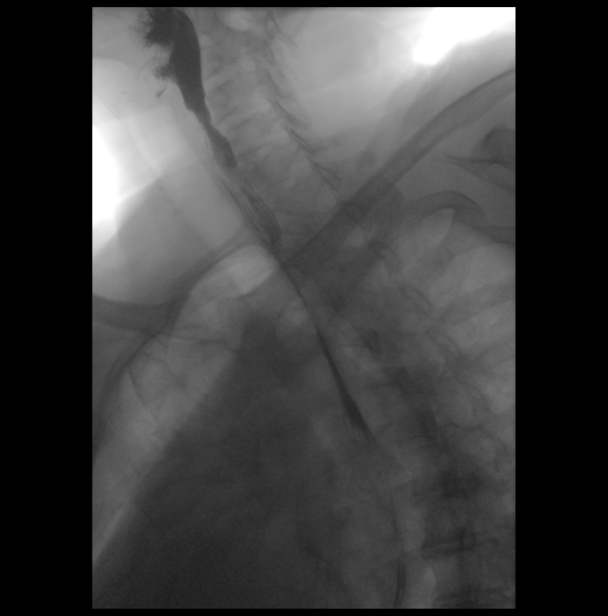
[frame 122/143]
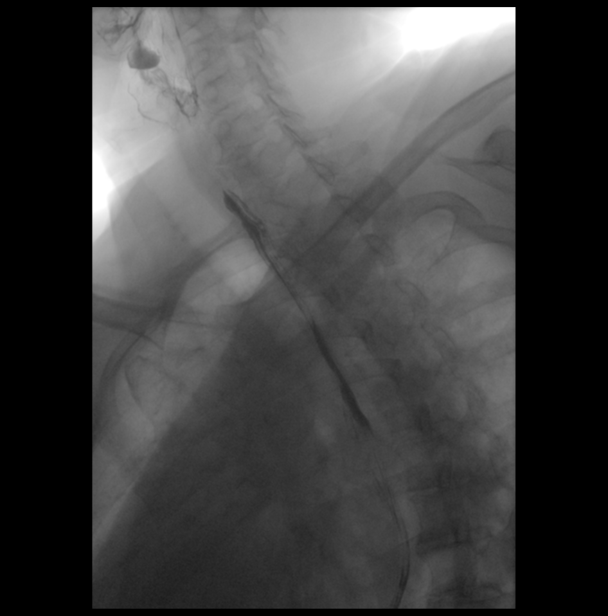

[Series 7: cp_standard · 0.26mm/px · 1 of 151 frames shown (5 of 10)]
[frame 73/151]
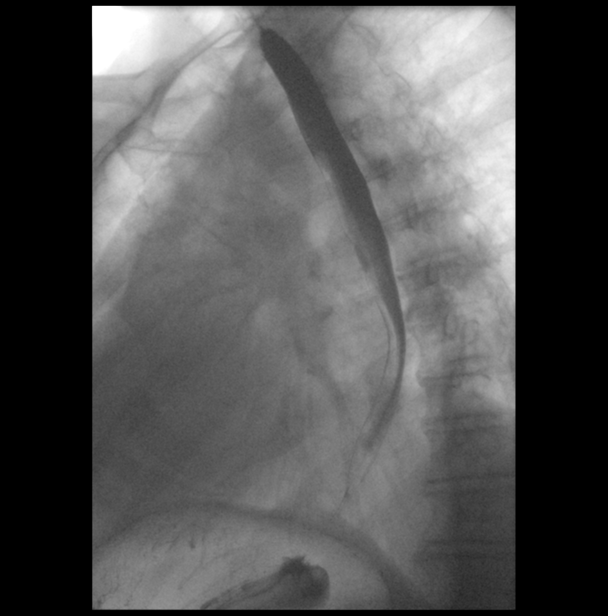

[Series 8: cp_standard · 0.26mm/px · 2 of 230 frames shown (6 of 10)]
[frame 35/230]
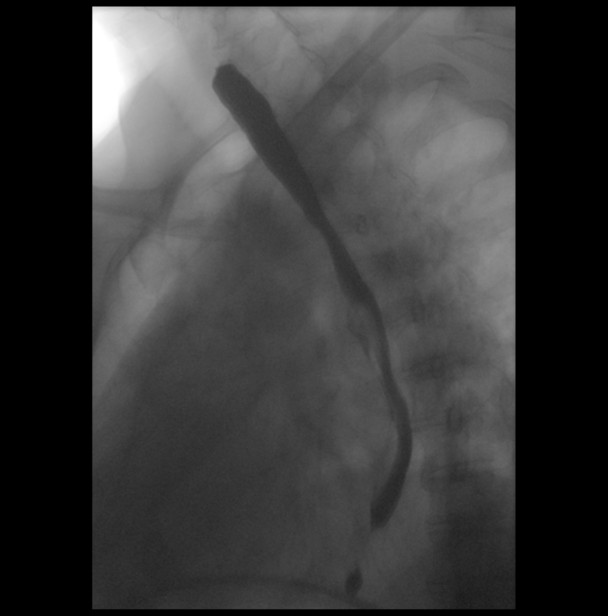
[frame 196/230]
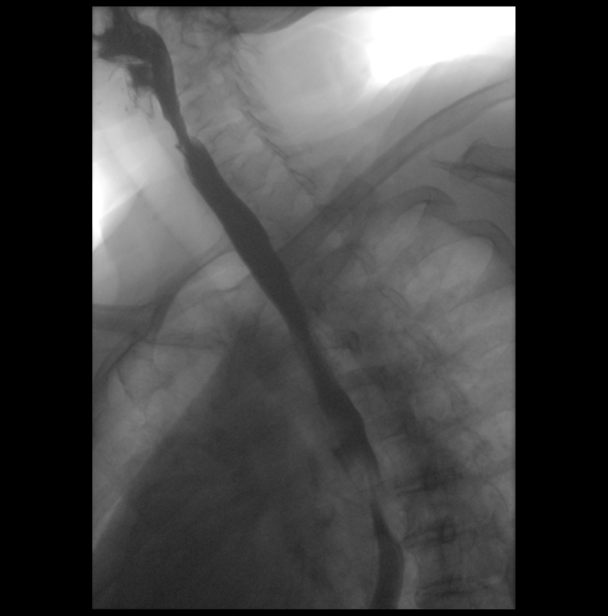

[Series 9: cp_standard · 0.18mm/px · 1 of 35 frames shown (7 of 10)]
[frame 30/35]
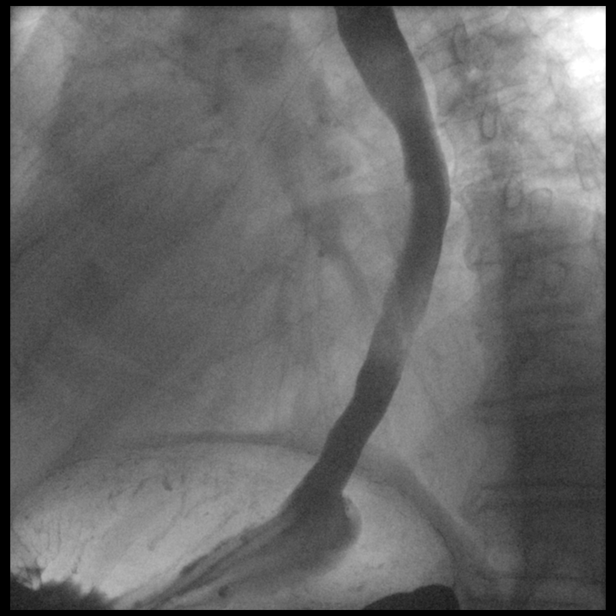

[Series 10: cp_standard · 0.26mm/px · 1 of 106 frames shown (8 of 10)]
[frame 16/106]
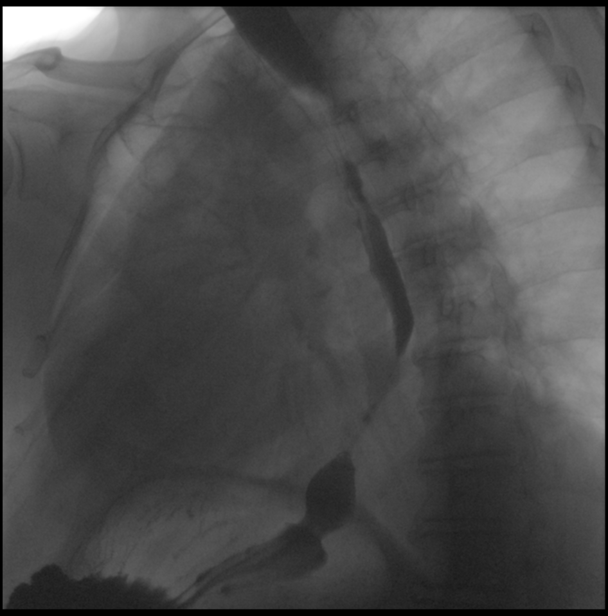

[Series 11: cp_standard · 0.26mm/px · 1 of 1 slices shown (9 of 10)]
[im 1/1]
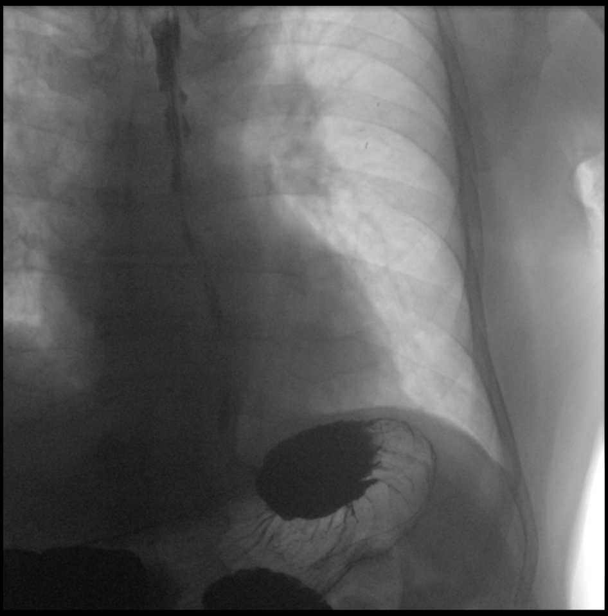

[Series 14: cp_standard · 0.27mm/px · 1 of 1 slices shown (10 of 10)]
[im 1/1]
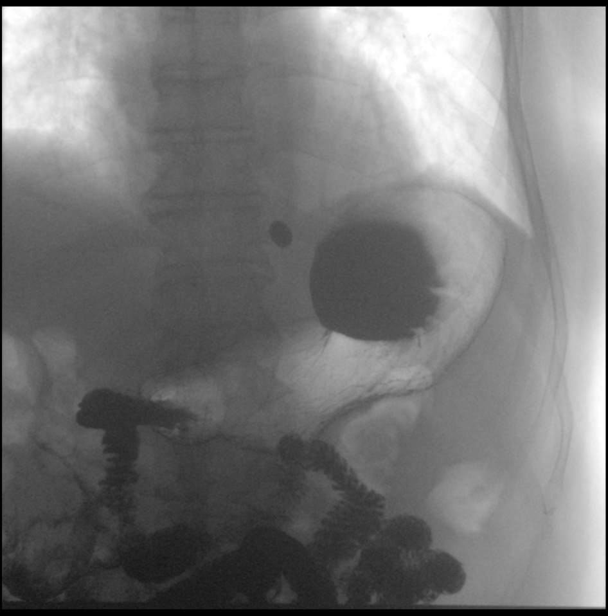

[14 of 24 positions shown; findings below may reference images not displayed]

FINDINGS: Evaluation of the hypopharynx demonstrates only a prominent
cricopharyngeus muscle. Lower cervical spondylosis.

Double contrast evaluation of the esophagus demonstrates no mucosal
abnormality.

Evaluation of esophageal peristalsis demonstrates an incomplete
primary peristaltic wave with proximal escape and contrast stasis
throughout the mid and upper esophagus. Example series 6 and series
7.

Full column evaluation of the esophagus demonstrates no persistent
narrowing or stricture.

Water siphon test demonstrates no significant spontaneous
gastroesophageal reflux.

Delayed passage of a 13 mm barium tablet at the distal esophagus.
IMPRESSION: 1. Esophageal dysmotility, likely early presbyesophagus.
2. No evidence of stricture or other anatomic cause for patient's
symptoms.

## 2019-06-04 ENCOUNTER — Telehealth: Payer: Self-pay | Admitting: Emergency Medicine

## 2019-06-04 DIAGNOSIS — R0602 Shortness of breath: Secondary | ICD-10-CM

## 2019-06-04 DIAGNOSIS — R0609 Other forms of dyspnea: Secondary | ICD-10-CM

## 2019-06-04 NOTE — Telephone Encounter (Signed)
LMTCB. Per last AVS from [place], patient to get repeat Chest CT September 2020. Order placed. Please make patient aware someone will call him to get that scheduled.

## 2019-06-04 NOTE — Telephone Encounter (Signed)
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 We have the CT scheduled per his request but he also wants to get PFT and see [name] on the same day after the CT being done on 07/03/2019. I told him he would have to be tested for Covid before doing the PFT. He ask if insurance would cover the test. I told him I would think that workman comp wouldn't but wasn't sure about his Medicare. Does anyone know if Medicare is covering the Covid test for Patients.

## 2019-06-04 NOTE — Addendum Note (Signed)
Addended by: Vivia Ewing on: 06/04/2019 01:59 PM   Modules accepted: Orders

## 2019-06-06 NOTE — Telephone Encounter (Signed)
We would need to call his [place] and ask about the COVID testing.

## 2019-06-06 NOTE — Telephone Encounter (Signed)
Spoke with patient. He stated that his claim number is [id number] and the company is [facility]. He did not have a number with him.   Patient will call back if he is able to find the number.

## 2019-06-07 NOTE — Telephone Encounter (Signed)
Called and left message with male family member to have pt to call back to schedule pft and f/u with RB -pr

## 2019-06-08 NOTE — Telephone Encounter (Signed)
Scheduled pt on 07/10/2019. Franklin Jones pt is wanting to know cost of COVID test. He would like that information left on his vm [phone] -pr

## 2019-06-08 NOTE — Telephone Encounter (Signed)
Called patient.  Left message on his vm that our charge for the COVID test is $100.

## 2019-07-02 ENCOUNTER — Other Ambulatory Visit: Payer: Self-pay | Admitting: Emergency Medicine

## 2019-07-03 ENCOUNTER — Ambulatory Visit (INDEPENDENT_AMBULATORY_CARE_PROVIDER_SITE_OTHER)
Admission: RE | Admit: 2019-07-03 | Discharge: 2019-07-03 | Disposition: A | Discharge status: Home / Self Care | Payer: No Typology Code available for payment source | Source: Ambulatory Visit | Attending: Emergency Medicine | Admitting: Emergency Medicine

## 2019-07-03 ENCOUNTER — Other Ambulatory Visit: Payer: Self-pay

## 2019-07-03 DIAGNOSIS — R0602 Shortness of breath: Secondary | ICD-10-CM

## 2019-07-05 ENCOUNTER — Other Ambulatory Visit (HOSPITAL_COMMUNITY)
Admission: RE | Admit: 2019-07-05 | Discharge: 2019-07-05 | Disposition: A | Discharge status: Home / Self Care | Payer: Medicare Other | Source: Ambulatory Visit | Attending: Emergency Medicine | Admitting: Emergency Medicine

## 2019-07-05 ENCOUNTER — Other Ambulatory Visit: Payer: Self-pay

## 2019-07-05 DIAGNOSIS — Z20828 Contact with and (suspected) exposure to other viral communicable diseases: Secondary | ICD-10-CM | POA: Diagnosis not present

## 2019-07-05 DIAGNOSIS — Z01812 Encounter for preprocedural laboratory examination: Secondary | ICD-10-CM | POA: Diagnosis not present

## 2019-07-05 LAB — SARS CORONAVIRUS 2 (TAT 6-24 HRS): SARS Coronavirus 2: NEGATIVE

## 2019-07-09 ENCOUNTER — Other Ambulatory Visit: Payer: Self-pay | Admitting: Emergency Medicine

## 2019-07-09 DIAGNOSIS — J4520 Mild intermittent asthma, uncomplicated: Secondary | ICD-10-CM

## 2019-07-10 ENCOUNTER — Other Ambulatory Visit: Payer: Self-pay

## 2019-07-10 ENCOUNTER — Ambulatory Visit (INDEPENDENT_AMBULATORY_CARE_PROVIDER_SITE_OTHER): Payer: No Typology Code available for payment source | Admitting: Emergency Medicine

## 2019-07-10 ENCOUNTER — Encounter: Payer: Self-pay | Admitting: Emergency Medicine

## 2019-07-10 DIAGNOSIS — K219 Gastro-esophageal reflux disease without esophagitis: Secondary | ICD-10-CM

## 2019-07-10 DIAGNOSIS — J4520 Mild intermittent asthma, uncomplicated: Secondary | ICD-10-CM

## 2019-07-10 DIAGNOSIS — J301 Allergic rhinitis due to pollen: Secondary | ICD-10-CM

## 2019-07-10 DIAGNOSIS — Z77090 Contact with and (suspected) exposure to asbestos: Secondary | ICD-10-CM | POA: Diagnosis not present

## 2019-07-10 LAB — PULMONARY FUNCTION TEST
DL/VA % pred: 61 %
DL/VA: 2.47 ml/min/mmHg/L
DLCO unc % pred: 60 %
DLCO unc: 17.67 ml/min/mmHg
FEF 25-75 Post: 2.7 L/sec
FEF 25-75 Pre: 1.44 L/sec
FEF2575-%Change-Post: 87 %
FEF2575-%Pred-Post: 94 %
FEF2575-%Pred-Pre: 50 %
FEV1-%Change-Post: 17 %
FEV1-%Pred-Post: 83 %
FEV1-%Pred-Pre: 71 %
FEV1-Post: 3.13 L
FEV1-Pre: 2.67 L
FEV1FVC-%Change-Post: 7 %
FEV1FVC-%Pred-Pre: 89 %
FEV6-%Change-Post: 8 %
FEV6-%Pred-Post: 90 %
FEV6-%Pred-Pre: 82 %
FEV6-Post: 4.34 L
FEV6-Pre: 3.98 L
FEV6FVC-%Change-Post: 0 %
FEV6FVC-%Pred-Post: 103 %
FEV6FVC-%Pred-Pre: 104 %
FVC-%Change-Post: 9 %
FVC-%Pred-Post: 87 %
FVC-%Pred-Pre: 79 %
FVC-Post: 4.42 L
FVC-Pre: 4.03 L
Post FEV1/FVC ratio: 71 %
Post FEV6/FVC ratio: 98 %
Pre FEV1/FVC ratio: 66 %
Pre FEV6/FVC Ratio: 99 %
RV % pred: 127 %
RV: 3.33 L
TLC % pred: 98 %
TLC: 7.64 L

## 2019-07-10 NOTE — Patient Instructions (Addendum)
Your CT scan of the chest and your pulmonary function testing are both stable compared with priors.  This is good news.  There is no evidence of active asbestos disease on your CT chest. Please keep your albuterol inhaler available to use 2 puffs if needed for shortness breath, chest tightness, wheezing. Continue your Allegra and fluticasone nasal spray as you have been using them. Get your flu shot this fall We will plan to follow-up in 1 year or sooner if you develop any new symptoms, difficulty with breathing.  We will check a chest x-ray and possibly spirometry at that time.

## 2019-07-10 NOTE — Progress Notes (Signed)
HPI:  ROV 07/05/18 --Franklin Jones is 68 with a history of former tobacco use and documented asbestos exposure when he worked for [employer].  Some pleural scarring suspected on chest imaging but we have been following stable CT scans of the chest and chest x-rays.  Mild obstruction, FEV1 2.83 L (73% predicted) on most recent PFT 06/14/18.  He has never really responded or benefited from albuterol, still has it available. He is having some increased nasal gtt over the last couple weeks. He is using allegra, just started flonase yesterday.   Repeat spirometry today shows FVC 3.7 L (72% predicted), FEV1 3.0 L (79% predicted), ratio 82%   Since last visit he has been worked up for a cardia myopathy and was found to have coronary disease, required cath by Dr. Einar Gip and then again in June by Dr Martinique for [facility]. He has more energy, his breathing is better. He remains on lasix.  ROV 07/10/2019 --this follow-up visit for 68 year old gentleman, former smoker with documented asbestos exposure (previously worked for [employer]).  He has mild obstruction on pulmonary function testing.  He repeated this today and I have reviewed.  This shows moderate obstruction FEV1 2.67 L (71% predicted) positive bronchodilator response to 3.13 L (83% predicted).  Overall stable compared with 2 years ago.  Lung volumes are normal.  Diffusion capacity is decreased and does not correct for alveolar volume, again stable compared with 2 years ago.  Repeat CT scan of the chest was done on 07/03/2019 and I have reviewed, shows scattered subpleural nodular disease all stable since 2018, no evidence of asbestos-related pleural disease or ILD. He has albuterol available to use, has not required since last time. He remains active, walks frequently. He has occasional cough related to allergies. He is on allegra, flonase prn. Is on PPI bid.     Vitals:   07/10/19 1009  BP: (!) 140/94  Pulse: 62  SpO2: 98%  Weight: 227 lb (103 kg)  Height: 6'  1.5" (1.867 m)   Gen: Pleasant, well-nourished, in no distress,  normal affect  ENT: No lesions,  mouth clear,  oropharynx clear, no postnasal drip  Neck: No JVD, no stridor  Lungs: No use of accessory muscles, clear B   Cardiovascular: RRR, heart sounds normal, no murmur or gallops, no peripheral edema  Musculoskeletal: No deformities, no cyanosis or clubbing  Neuro: alert, non focal  Skin: Warm, no lesions or rashes  PULMONARY FUNCTON TEST 04/09/2010 04/28/2011 05/04/2012 06/06/2013  FVC 4.98 4.51 4.42 4.27  FEV1 3.68 3.2 3.24 3.1  FEV1/FVC 73.9 71 73.3 72.6  FVC  % Predicted 92 87 85 80  FEV % Predicted 90 88 90 78  FeF 25-75 2.66 2.01 2.32 4.09  FeF 25-75 % Predicted 3.32 3.29 3.25 5.04     ASSESSMENT/PLAN:  Asthma Doing well.  No exacerbations.  He has not needed albuterol and has a good exercise tolerance.  Pulmonary function testing today shows stable mild to moderate obstruction with a positive bronchodilator response  GERD (gastroesophageal reflux disease) Continue PPI  Allergic rhinitis Continue Allegra, fluticasone nasal spray  Asbestos exposure No evidence to support active asbestosis or asbestos exposure at this time.  Likely repeat his chest x-ray in 1 year, plan to repeat his CT chest in 2 years unless there is a clinical change.  Follow his spirometry.  Franklin Apo, MD, PhD 07/10/2019, 10:47 AM [facility] [phone] or if no answer [phone]

## 2019-07-10 NOTE — Assessment & Plan Note (Signed)
Continue PPI ?

## 2019-07-10 NOTE — Assessment & Plan Note (Signed)
Continue Allegra, fluticasone nasal spray

## 2019-07-10 NOTE — Progress Notes (Signed)
PFT done today. 

## 2019-07-10 NOTE — Assessment & Plan Note (Signed)
No evidence to support active asbestosis or asbestos exposure at this time.  Likely repeat his chest x-ray in 1 year, plan to repeat his CT chest in 2 years unless there is a clinical change.  Follow his spirometry.

## 2019-07-10 NOTE — Assessment & Plan Note (Signed)
Doing well.  No exacerbations.  He has not needed albuterol and has a good exercise tolerance.  Pulmonary function testing today shows stable mild to moderate obstruction with a positive bronchodilator response

## 2019-07-24 DIAGNOSIS — Z299 Encounter for prophylactic measures, unspecified: Secondary | ICD-10-CM | POA: Diagnosis not present

## 2019-07-24 DIAGNOSIS — Z1339 Encounter for screening examination for other mental health and behavioral disorders: Secondary | ICD-10-CM | POA: Diagnosis not present

## 2019-07-24 DIAGNOSIS — Z125 Encounter for screening for malignant neoplasm of prostate: Secondary | ICD-10-CM | POA: Diagnosis not present

## 2019-07-24 DIAGNOSIS — Z23 Encounter for immunization: Secondary | ICD-10-CM | POA: Diagnosis not present

## 2019-07-24 DIAGNOSIS — N183 Chronic kidney disease, stage 3 unspecified: Secondary | ICD-10-CM | POA: Diagnosis not present

## 2019-07-24 DIAGNOSIS — E7800 Pure hypercholesterolemia, unspecified: Secondary | ICD-10-CM | POA: Diagnosis not present

## 2019-07-24 DIAGNOSIS — Z1211 Encounter for screening for malignant neoplasm of colon: Secondary | ICD-10-CM | POA: Diagnosis not present

## 2019-07-24 DIAGNOSIS — Z7189 Other specified counseling: Secondary | ICD-10-CM | POA: Diagnosis not present

## 2019-07-24 DIAGNOSIS — Z1331 Encounter for screening for depression: Secondary | ICD-10-CM | POA: Diagnosis not present

## 2019-07-24 DIAGNOSIS — I10 Essential (primary) hypertension: Secondary | ICD-10-CM | POA: Diagnosis not present

## 2019-07-24 DIAGNOSIS — Z79899 Other long term (current) drug therapy: Secondary | ICD-10-CM | POA: Diagnosis not present

## 2019-07-24 DIAGNOSIS — I509 Heart failure, unspecified: Secondary | ICD-10-CM | POA: Diagnosis not present

## 2019-07-24 DIAGNOSIS — Z0000 Encounter for general adult medical examination without abnormal findings: Secondary | ICD-10-CM | POA: Diagnosis not present

## 2019-07-24 DIAGNOSIS — Z6831 Body mass index (BMI) 31.0-31.9, adult: Secondary | ICD-10-CM | POA: Diagnosis not present

## 2019-08-15 ENCOUNTER — Other Ambulatory Visit: Payer: Self-pay | Admitting: Cardiology

## 2019-08-15 DIAGNOSIS — N1831 Chronic kidney disease, stage 3a: Secondary | ICD-10-CM

## 2019-08-15 DIAGNOSIS — I25110 Atherosclerotic heart disease of native coronary artery with unstable angina pectoris: Secondary | ICD-10-CM

## 2019-08-15 DIAGNOSIS — E7800 Pure hypercholesterolemia, unspecified: Secondary | ICD-10-CM

## 2019-08-16 DIAGNOSIS — E7800 Pure hypercholesterolemia, unspecified: Secondary | ICD-10-CM | POA: Diagnosis not present

## 2019-08-16 DIAGNOSIS — N1831 Chronic kidney disease, stage 3a: Secondary | ICD-10-CM | POA: Diagnosis not present

## 2019-08-16 DIAGNOSIS — I25110 Atherosclerotic heart disease of native coronary artery with unstable angina pectoris: Secondary | ICD-10-CM | POA: Diagnosis not present

## 2019-08-20 ENCOUNTER — Ambulatory Visit (INDEPENDENT_AMBULATORY_CARE_PROVIDER_SITE_OTHER): Payer: Medicare Other | Admitting: Cardiology

## 2019-08-20 ENCOUNTER — Encounter: Payer: Self-pay | Admitting: Cardiology

## 2019-08-20 ENCOUNTER — Other Ambulatory Visit: Payer: Self-pay

## 2019-08-20 VITALS — BP 120/74 | HR 49 | Ht 74.0 in | Wt 227.1 lb

## 2019-08-20 DIAGNOSIS — I129 Hypertensive chronic kidney disease with stage 1 through stage 4 chronic kidney disease, or unspecified chronic kidney disease: Secondary | ICD-10-CM

## 2019-08-20 DIAGNOSIS — E7800 Pure hypercholesterolemia, unspecified: Secondary | ICD-10-CM | POA: Diagnosis not present

## 2019-08-20 DIAGNOSIS — N1831 Chronic kidney disease, stage 3a: Secondary | ICD-10-CM | POA: Diagnosis not present

## 2019-08-20 DIAGNOSIS — I25118 Atherosclerotic heart disease of native coronary artery with other forms of angina pectoris: Secondary | ICD-10-CM

## 2019-08-20 DIAGNOSIS — I255 Ischemic cardiomyopathy: Secondary | ICD-10-CM | POA: Diagnosis not present

## 2019-08-20 DIAGNOSIS — I5022 Chronic systolic (congestive) heart failure: Secondary | ICD-10-CM

## 2019-08-20 MED ORDER — IVABRADINE HCL 7.5 MG PO TABS: 3.7500 mg | ORAL_TABLET | Freq: Two times a day (BID) | ORAL | 11 refills | Status: DC

## 2019-08-20 NOTE — Progress Notes (Signed)
Primary Physician/Referring:  Glenda Chroman, MD  Patient ID: Franklin Jones, male    DOB: 1950/12/11, 68 y.o.   MRN: [id number]  Chief Complaint  Patient presents with  . Coronary Artery Disease  . Cardiomyopathy   HPI:    Franklin Jones  is a 68 y.o. Caucasian male  with  coronary artery disease, complete occlusion of proximal LAD to mid LAD and acute marginal of RCA by coronary angiography on 12/28/2017 at which time his EF was 20-25% and initially presented with florid CHF. He is S/P Mid LAD PCI in June 2019, EF now has improved to 45% by echo in Sept 2019.  His past medical history significant for hypertension, stage III chronic kidney disease and hyperlipidemia.     Dyspnea has remained stable. He continues to have "heartburn like symptoms" when he exercises but he is able to continue without stopping.  Discomfort resolved spontaneously.  This is remained stable and chronic.  No dyspnea, no palpitations, no dizziness or syncope.  He does have occasional leg cramps at night.  Past Medical History:  Diagnosis Date  . Acute on chronic combined systolic and diastolic CHF (congestive heart failure) ([place]) 12/15/2017  . Acute on chronic heart failure ([place]) 12/15/2017  . Allergic rhinitis   . Arthritis    "qwhere" (6//19/2019)  . Asbestosis(501)    followed by Dr Barkley Boards- "on monitoring only; stable x years" (04/05/2018)  . Basal cell carcinoma    "face; head" (04/05/2018)  . Degenerative arthritis of knee 11/19/2011  . Dyspnea on exertion 02/01/2019  . GERD (gastroesophageal reflux disease)   . High cholesterol   . History of asbestos exposure    followed by Dr West Carbo- states stable years- last CT chest 7/12 in Epic  . Hypertension   . LBBB (left bundle branch block) 01/31/2018  . Shortness of breath    SOME SOB BUT CAN CLIMB FLIGHT OF STAIRS  . Squamous carcinoma    "back of neck" (04/05/2018)   Past Surgical History:  Procedure Laterality Date  . CATARACT EXTRACTION,  BILATERAL Bilateral 2020  . COLONOSCOPY    . COLONOSCOPY N/A 08/22/2014   Procedure: COLONOSCOPY;  Surgeon: Rogene Houston, MD;  [facility]: [facility];  Service: Endoscopy;  Laterality: N/A;  830  . CORONARY ANGIOPLASTY WITH STENT PLACEMENT  04/05/2018   CTO PCI of the mid LAD with DES x 2.  . CORONARY CTO INTERVENTION N/A 04/05/2018   Procedure: CORONARY CTO INTERVENTION;  Surgeon: Martinique, Peter M, MD;  Location: [facility];  Service: Cardiovascular;  Laterality: N/A;  . FINGER AMPUTATION Left 2009   traumatic injury left small finger with amputation  . JOINT REPLACEMENT    . KNEE ARTHROPLASTY  11/19/2011   Procedure: COMPUTER ASSISTED TOTAL KNEE ARTHROPLASTY;  Surgeon: Mcarthur Rossetti, MD;  Location: [facility];  Service: Orthopedics;  Laterality: Right;  Right Total Knee Arthroplasty  . MASS EXCISION Left 03/12/2014   Procedure: EXCISION LEFT NECK SKIN MASS;  Surgeon: Gayland Curry, MD;  Location: [facility];  Service: General;  Laterality: Left;  . RIGHT/LEFT HEART CATH AND CORONARY ANGIOGRAPHY N/A 12/27/2017   Procedure: RIGHT/LEFT HEART CATH AND CORONARY ANGIOGRAPHY;  Surgeon: Adrian Prows, MD;  Location: [facility];  Service: Cardiovascular;  Laterality: N/A;  . TOTAL KNEE ARTHROPLASTY  08/18/2012   Procedure: TOTAL KNEE ARTHROPLASTY;  Surgeon: Mcarthur Rossetti, MD;  Location: [facility];  Service: Orthopedics;  Laterality: Left;  Left Total Knee Arthroplasty  . ULTRASOUND GUIDANCE FOR VASCULAR ACCESS  04/05/2018   Procedure: Ultrasound Guidance For Vascular Access;  Surgeon: Martinique, Peter M, MD;  Location: [facility];  Service: Cardiovascular;;   Social History   Socioeconomic History  . Marital status: Married    Spouse name: Not on file  . Number of children: 3  . Years of education: Not on file  . Highest education level: Not on file  Occupational History  . Occupation: Horticulturist, commercial: [employer]  .  Financial resource strain: Not on file  . Food insecurity    Worry: Not on file    Inability: Not on file  . Transportation needs    Medical: Not on file    Non-medical: Not on file  Tobacco Use  . Smoking status: Former Smoker    Packs/day: 1.00    Years: 20.00    Pack years: 20.00    Types: Cigarettes    Quit date: 10/18/1985    Years since quitting: 33.8  . Smokeless tobacco: Never Used  Substance and Sexual Activity  . Alcohol use: Yes    Alcohol/week: 9.0 standard drinks    Types: 3 Cans of beer, 6 Standard drinks or equivalent per week  . Drug use: Not Currently  . Sexual activity: Not on file  Lifestyle  . Physical activity    Days per week: Not on file    Minutes per session: Not on file  . Stress: Not on file  Relationships  . Social Herbalist on phone: Not on file    Gets together: Not on file    Attends religious service: Not on file    Active member of club or organization: Not on file    Attends meetings of clubs or organizations: Not on file    Relationship status: Not on file  . Intimate partner violence    Fear of current or ex partner: Not on file    Emotionally abused: Not on file    Physically abused: Not on file    Forced sexual activity: Not on file  Other Topics Concern  . Not on file  Social History Narrative  . Not on file   ROS  Review of Systems  Constitution: Negative for chills, decreased appetite, malaise/fatigue and weight gain.  Cardiovascular: Positive for chest pain and dyspnea on exertion (stable). Negative for leg swelling and syncope.  Endocrine: Negative for cold intolerance.  Hematologic/Lymphatic: Does not bruise/bleed easily.  Musculoskeletal: Positive for muscle cramps (occasional at night). Negative for joint swelling.  Gastrointestinal: Positive for heartburn. Negative for abdominal pain, anorexia, change in bowel habit, hematochezia and melena.  Neurological: Negative for headaches and light-headedness.   Psychiatric/Behavioral: Negative for depression and substance abuse.  All other systems reviewed and are negative.  Objective   Vitals with BMI 08/20/2019 07/10/2019 02/01/2019  Height 6\' 2"  6' 1.5" 6\' 4"   Weight 227 lbs 2 oz 227 lbs 224 lbs 8 oz  BMI 29.15 [id number]  Systolic [id number] [id number]  Diastolic 74 94 74  Pulse 49 62 52    Blood pressure 120/74, pulse (!) 49, height 6\' 2"  (1.88 m), weight 227 lb 1.6 oz (103 kg), SpO2 96 %. Body mass index is 29.16 kg/m.   Physical Exam  Constitutional: He appears well-developed and well-nourished.  HENT:  Head: Atraumatic.  Eyes: Conjunctivae are normal.  Neck: Neck supple. No JVD present. No thyromegaly  present.  Cardiovascular: Normal rate, regular rhythm, normal heart sounds, intact distal pulses and normal pulses. Exam reveals no gallop.  No murmur heard. No leg edema, no JVD.  Pulmonary/Chest: Effort normal and breath sounds normal.  Abdominal: Soft. Bowel sounds are normal.  Musculoskeletal: Normal range of motion.  Neurological: He is alert.  Skin: Skin is warm and dry.  Psychiatric: He has a normal mood and affect.   Laboratory examination:   Recent Labs    01/25/19 0816 08/16/19 0850  NA 141 140  K 5.1 4.9  CL 103 105  CO2 23 24  GLUCOSE 97 107*  BUN 19 25  CREATININE 1.77* 1.54*  CALCIUM 9.6 9.7  GFRNONAA 39* 45*  GFRAA 45* 52*   CMP Latest Ref Rng & Units 08/16/2019 01/25/2019 04/06/2018  Glucose 65 - 99 mg/dL 107(H) 97 134(H)  BUN 8 - 27 mg/dL 25 19 21(H)  Creatinine 0.76 - 1.27 mg/dL 1.54(H) 1.77(H) 1.48(H)  Sodium 134 - 144 mmol/L 140 141 140  Potassium 3.5 - 5.2 mmol/L 4.9 5.1 4.4  Chloride 96 - 106 mmol/L 105 103 108  CO2 20 - 29 mmol/L 24 23 26   Calcium 8.6 - 10.2 mg/dL 9.7 9.6 8.8(L)  Total Protein 6.0 - 8.5 g/dL 7.1 7.3 -  Total Bilirubin 0.0 - 1.2 mg/dL 0.5 0.5 -  Alkaline Phos 39 - 117 IU/L 93 89 -  AST 0 - 40 IU/L 16 22 -  ALT 0 - 44 IU/L 13 14 -   CBC Latest Ref Rng & Units 01/25/2019 04/06/2018  03/29/2018  WBC 3.4 - 10.8 x10E3/uL 6.1 7.0 7.8  Hemoglobin 13.0 - 17.7 g/dL 14.2 11.7(L) 12.9(L)  Hematocrit 37.5 - 51.0 % 41.8 36.5(L) 39.0  Platelets 150 - 450 x10E3/uL 188 151 214   Lipid Panel     Component Value Date/Time   CHOL 106 08/16/2019 0850   TRIG 78 08/16/2019 0850   HDL 43 08/16/2019 0850   LDLCALC 47 08/16/2019 0850   LDLDIRECT 55 08/16/2019 0850   HEMOGLOBIN A1C No results found for: HGBA1C, MPG TSH Recent Labs    08/16/19 0850  TSH 3.680   Medications and allergies   Allergies  Allergen Reactions  . Penicillins Rash    Has patient had a PCN reaction causing immediate rash, facial/tongue/throat swelling, SOB or lightheadedness with hypotension: unkn Has patient had a PCN reaction causing severe rash involving mucus membranes or skin necrosis: unkn Has patient had a PCN reaction that required hospitalization: no Has patient had a PCN reaction occurring within the last 10 years: no If all of the above answers are "NO", then may proceed with Cephalosporin use.      Prior to Admission medications   Medication Sig Start Date End Date Taking? Authorizing Provider  albuterol (PROVENTIL HFA;VENTOLIN HFA) 108 (90 Base) MCG/ACT inhaler Inhale 2 puffs into the lungs every 4 (four) hours as needed for wheezing or shortness of breath. 06/14/17   Byrum, Rose Fillers, MD  aluminum hydroxide-magnesium carbonate (GAVISCON) 95-358 MG/15ML SUSP Take 15 mLs by mouth daily as needed for indigestion.     [provider]  aspirin EC 81 MG tablet Take 1 tablet (81 mg total) by mouth daily. 03/29/18   Martinique, Peter M, MD  BIDIL 20-37.5 MG tablet TAKE ONE TABLET BY MOUTH THREE TIMES A DAY  11/23/18   Adrian Prows, MD  carvedilol (COREG) 12.5 MG tablet Take 1 tablet (12.5 mg total) by mouth 2 (two) times daily. 03/30/19   Einar Gip,  Ulice Dash, MD  clopidogrel (PLAVIX) 75 MG tablet Take 1 tablet (75 mg total) by mouth daily. 02/01/19   Adrian Prows, MD  fexofenadine (ALLEGRA) 180 MG tablet Take 180  mg by mouth daily.    [provider]  furosemide (LASIX) 20 MG tablet Take 1 tablet (20 mg total) by mouth 2 (two) times daily. Patient taking differently: Take 20 mg by mouth daily.  01/04/19   Miquel Dunn, NP  ivabradine (CORLANOR) 7.5 MG TABS tablet Take 1 tablet (7.5 mg total) by mouth 2 (two) times daily with a meal. 01/01/19   Adrian Prows, MD  nitroGLYCERIN (NITROSTAT) 0.4 MG SL tablet Place 1 tablet (0.4 mg total) under the tongue every 5 (five) minutes as needed for chest pain. 02/01/19 02/01/20  Adrian Prows, MD  pantoprazole (PROTONIX) 40 MG tablet TAKE ONE TABLET TWICE DAILY BEFORE A MEAL. 03/15/19   Setzer, Rona Ravens, NP  potassium chloride (K-DUR) 10 MEQ tablet Take 1 tablet (10 mEq total) by mouth daily. 01/04/19   Miquel Dunn, NP  rosuvastatin (CRESTOR) 10 MG tablet Take 1 tablet (10 mg total) by mouth daily. 01/04/19   Miquel Dunn, NP     Current Outpatient Medications  Medication Instructions  . albuterol (PROVENTIL HFA;VENTOLIN HFA) 108 (90 Base) MCG/ACT inhaler 2 puffs, Inhalation, Every 4 hours PRN  . aluminum hydroxide-magnesium carbonate (GAVISCON) 95-358 MG/15ML SUSP 15 mLs, Oral, Daily PRN  . aspirin EC 81 mg, Oral, Daily  . BIDIL 20-37.5 MG tablet TAKE ONE TABLET BY MOUTH THREE TIMES A DAY   . carvedilol (COREG) 12.5 mg, Oral, 2 times daily  . fexofenadine (ALLEGRA) 180 mg, Oral, Daily  . ivabradine (CORLANOR) 3.75 mg, Oral, 2 times daily with meals  . nitroGLYCERIN (NITROSTAT) 0.4 mg, Sublingual, Every 5 min PRN  . pantoprazole (PROTONIX) 40 MG tablet TAKE ONE TABLET TWICE DAILY BEFORE A MEAL.  . rosuvastatin (CRESTOR) 10 mg, Oral, Daily    Radiology:  No results found.   Cardiac Studies:   Echocardiogram 07/18/2018: Left ventricle cavity is normal in size. Moderate concentric hypertrophy of the left ventricle. Mild decrease in global wall motion. Visual EF is 45-50%. Doppler evidence of grade I (impaired) diastolic dysfunction,  normal LAP. Calculated EF 50%.  Mild tricuspid regurgitation. Estimated pulmonary artery systolic pressure 26 mmHg.  Mild pulmonic regurgitation. Compared tp previous study on 03/22/2018, LVEF has significantly improved. Visual EF is 45-50%.  Compared tp previous study on 03/22/2018, LVEF has significantly improved from 20%.  Coronary angiogram 04/08/2018 Successful CTO PCI of the mid LAD with DES x 2. (Synergy 2.5 x 24 and 2.25 x 38 mm DES. Medical therapy for CTO RCA, flow improved since prior angiography on 12/28/2017. Diffusely diseased RCA Mild disease Cx.  Assessment     ICD-10-CM   1. Coronary artery disease of native artery of native heart with stable angina pectoris ([place])  I25.118 EKG 12-Lead    PCV ECHOCARDIOGRAM COMPLETE  2. Chronic renal failure, stage 3a  N18.31 EKG 12-Lead  3. Hypercholesteremia  E78.00 EKG 12-Lead  4. Ischemic cardiomyopathy  I25.5 EKG 12-Lead    ivabradine (CORLANOR) 7.5 MG TABS tablet    PCV ECHOCARDIOGRAM COMPLETE  5. Chronic systolic CHF (congestive heart failure) ([facility])  I50.22 ivabradine (CORLANOR) 7.5 MG TABS tablet   Meds ordered this encounter  Medications  . ivabradine (CORLANOR) 7.5 MG TABS tablet    Sig: Take 0.5 tablets (3.75 mg total) by mouth 2 (two) times daily with a meal.  Dispense:  60 tablet    Refill:  11    Discontinue Plavix (Course completed. Decrease Corlanor to 7.5 mg 1/2 BID Loletha Grayer and EF normal). No CHF. Stop lasix and KCL.  EKG 08/20/2019: Marked sinus bradycardia at the rate of 42 bpm with borderline first-degree AV block, left atrial enlargement, otherwise normal EKG. No significant change from  EKG 04/17/2018: Marked sinus bradycardia at rate of 43 bpm    Recommendations:   He is here on a six-month office visit and follow-up of ischemic cardiomyopathy, chronic stable angina, hyperlipidemia and hypertension.  He is presently doing well, no clinical evidence of heart failure, symptoms of angina pectoris is class II.   As he has got underlying sinus bradycardia, on low-dose of beta blocker, will also reduce the dose of Corlanor from 7.5 mg to one half tablet b.i.d.  He could certainly go up on the dose of a beta blocker if necessary, previously his blood pressure was very soft and we had no choice but to increase and and Corlanor.  He is presently on carvedilol 12.5 mg b.i.d. and he could certainly go up to 25 mg b.i.d.  I reviewed his labs, lipids under excellent control and renal function is stable.  As there is no clinical evidence of heart failure, I will also discontinue furosemide and potassium supplements.  We will also discontinue Plavix as it is been close to 2 years since his angioplasty.  Due to aspirin indefinitely.  In view of the significant medication changes, I would like to see him back in 3 months. The heartburn sensation that he complains of with exertion activity also related to GERD, it'll be interesting to see whether stopping potassium will make changes to his symptoms. I will repeat echo prior to his OV,   Adrian Prows, MD, [facility] 08/25/2019, 6:17 AM [facility]. [place] Pager: [phone] Office: [phone] If no answer Cell [phone]

## 2019-08-23 ENCOUNTER — Other Ambulatory Visit: Payer: Self-pay | Admitting: Cardiology

## 2019-08-25 LAB — CMP14+EGFR
ALT: 13 IU/L (ref 0–44)
AST: 16 IU/L (ref 0–40)
Albumin/Globulin Ratio: 1.4 (ref 1.2–2.2)
Albumin: 4.2 g/dL (ref 3.8–4.8)
Alkaline Phosphatase: 93 IU/L (ref 39–117)
BUN/Creatinine Ratio: 16 (ref 10–24)
BUN: 25 mg/dL (ref 8–27)
Bilirubin Total: 0.5 mg/dL (ref 0.0–1.2)
CO2: 24 mmol/L (ref 20–29)
Calcium: 9.7 mg/dL (ref 8.6–10.2)
Chloride: 105 mmol/L (ref 96–106)
Creatinine, Ser: 1.54 mg/dL — ABNORMAL HIGH (ref 0.76–1.27)
GFR calc Af Amer: 52 mL/min/{1.73_m2} — ABNORMAL LOW (ref >59)
GFR calc non Af Amer: 45 mL/min/{1.73_m2} — ABNORMAL LOW (ref >59)
Globulin, Total: 2.9 g/dL (ref 1.5–4.5)
Glucose: 107 mg/dL — ABNORMAL HIGH (ref 65–99)
Potassium: 4.9 mmol/L (ref 3.5–5.2)
Sodium: 140 mmol/L (ref 134–144)
Total Protein: 7.1 g/dL (ref 6.0–8.5)

## 2019-08-25 LAB — LIPID PANEL WITH LDL/HDL RATIO
Cholesterol, Total: 106 mg/dL (ref 100–199)
HDL: 43 mg/dL (ref >39)
LDL Chol Calc (NIH): 47 mg/dL (ref 0–99)
LDL/HDL Ratio: 1.1 ratio (ref 0.0–3.6)
Triglycerides: 78 mg/dL (ref 0–149)
VLDL Cholesterol Cal: 16 mg/dL (ref 5–40)

## 2019-08-25 LAB — TSH: TSH: 3.68 u[IU]/mL (ref 0.450–4.500)

## 2019-08-25 LAB — LDL CHOLESTEROL, DIRECT: LDL Direct: 55 mg/dL (ref 0–99)

## 2019-08-25 LAB — LIPOPROTEIN A (LPA): Lipoprotein (a): 11.2 nmol/L (ref <75.0)

## 2019-09-27 ENCOUNTER — Other Ambulatory Visit: Payer: Self-pay

## 2019-09-27 MED ORDER — CARVEDILOL 12.5 MG PO TABS: 12.5000 mg | ORAL_TABLET | Freq: Two times a day (BID) | ORAL | 1 refills | Status: DC

## 2019-11-08 DIAGNOSIS — Z23 Encounter for immunization: Secondary | ICD-10-CM | POA: Diagnosis not present

## 2019-11-14 ENCOUNTER — Other Ambulatory Visit: Payer: Self-pay

## 2019-11-14 ENCOUNTER — Ambulatory Visit (INDEPENDENT_AMBULATORY_CARE_PROVIDER_SITE_OTHER): Payer: Medicare Other

## 2019-11-14 ENCOUNTER — Telehealth: Payer: Self-pay

## 2019-11-14 DIAGNOSIS — I25118 Atherosclerotic heart disease of native coronary artery with other forms of angina pectoris: Secondary | ICD-10-CM | POA: Diagnosis not present

## 2019-11-14 DIAGNOSIS — I255 Ischemic cardiomyopathy: Secondary | ICD-10-CM

## 2019-11-14 NOTE — Telephone Encounter (Signed)
We did an appeal for corlanor and they denied it because patient does not have a resting HR greater than or equal to 70 BPM; His HR is actually pretty low 40 and 6 months ago it was 52 looking back at previous office visits; anyone with a low heart rate according to them has a FDA labeled contraindication; Please advise   Send this message back to clinical pool not me

## 2019-11-14 NOTE — [place] Notes (Signed)
Stable LV systolic function. Continue presnet medical therapy. Corlanor denied by insurance, will recheck echo in 6 months for preservation of LVEF.

## 2019-11-14 NOTE — Telephone Encounter (Signed)
Patient has had ischemic cardiomyopathy with severe LV systolic dysfunction and also underlying sinus tachycardia and low blood pressure, was started on Corlanor with significant improvement in heart rate and also wellbeing.  Insurance has denied Corlanor In view of underlying sinus bradycardia, however please note the bradycardia is related to the medication and he was previously tachycardic prior to the medication use.  He is on maximal medical therapy.  I will look at echocardiogram today and if his ejection fraction is stable at 45%, we will discontinue Corlanor and reevaluate his symptoms, if he has recurrence of tachycardia and any change in his symptoms, we may have to reinitiate the medication.  Echocardiogram 11/14/2019:  Left ventricle cavity is minimally dilated at 5.4 cm. Moderate LVH.  Mildly depressed LV systolic function with EF 45%. Doppler evidence of grade I (impaired) diastolic dysfunction, elevated LAP. Left ventricle regional wall motion findings: Basal anteroseptal, Basal inferoseptal, Mid anteroseptal, Mid inferoseptal, Apical anterior and Apical septal mild hypokinesis. Calculated EF 45%. Trileaflet aortic valve with no regurgitation noted. Mild aortic valve leaflet thickening. Grossly normal pulmonic valve.  Mild to moderate pulmonic regurgitation. No significant change from 07/18/2018.

## 2019-11-14 NOTE — Telephone Encounter (Signed)
Let patient know about corlanor, he can stop for now

## 2019-11-15 NOTE — Telephone Encounter (Signed)
Discussed with patient Dr.Ganji's recommendation for discontinuing Corlanor. Patient verbalized understanding.

## 2019-11-20 ENCOUNTER — Other Ambulatory Visit: Payer: Medicare Other

## 2019-11-29 ENCOUNTER — Other Ambulatory Visit: Payer: Self-pay | Admitting: Cardiology

## 2019-11-29 ENCOUNTER — Other Ambulatory Visit: Payer: Self-pay

## 2019-11-29 ENCOUNTER — Ambulatory Visit (INDEPENDENT_AMBULATORY_CARE_PROVIDER_SITE_OTHER): Payer: Medicare Other | Admitting: Cardiology

## 2019-11-29 ENCOUNTER — Encounter: Payer: Self-pay | Admitting: Cardiology

## 2019-11-29 VITALS — BP 123/80 | HR 62 | Temp 96.3°F | Ht 74.0 in | Wt 230.0 lb

## 2019-11-29 DIAGNOSIS — E7800 Pure hypercholesterolemia, unspecified: Secondary | ICD-10-CM | POA: Diagnosis not present

## 2019-11-29 DIAGNOSIS — I255 Ischemic cardiomyopathy: Secondary | ICD-10-CM

## 2019-11-29 DIAGNOSIS — I25118 Atherosclerotic heart disease of native coronary artery with other forms of angina pectoris: Secondary | ICD-10-CM | POA: Diagnosis not present

## 2019-11-29 DIAGNOSIS — N1831 Chronic kidney disease, stage 3a: Secondary | ICD-10-CM | POA: Diagnosis not present

## 2019-11-29 NOTE — Progress Notes (Signed)
Primary Physician/Referring:  Glenda Chroman, MD  Patient ID: Franklin Jones, Jones    DOB: 13-Nov-1950, 69 y.o.   MRN: [id number]  Chief Complaint  Patient presents with  . Coronary Artery Disease  . Hypertension  . Congestive Heart Failure  . Follow-up    3 month   HPI:    Franklin Jones  is a 69 y.o. Caucasian Jones  with hypertension, stage III chronic kidney disease, hyperlipidemia, coronary artery disease, complete occlusion of proximal LAD to mid LAD and acute marginal of RCA by coronary angiography on 12/28/2017 at which time his EF was 20-25% and initially presented with florid CHF. He is S/P Mid LAD PCI in June 2019, EF now has improved to 45% by recent echo in Jan 2021. He now presents for follow up and to discuss echo results.   Patient is here on 3 month office visit. At his last office visit, plavix was discontinued as he had completed the course. Lasix and potassium supplements also discontinued. Corlanor discontinued due to refusal from insurance companies and also EF improved.  Continues to c/o exertional chest burning. He continues to have "heartburn like symptoms" when he exercises and concerned about symptom. No dyspnea, no palpitations, no dizziness or syncope.  He does have occasional leg cramps at night.  Past Medical History:  Diagnosis Date  . Acute on chronic combined systolic and diastolic CHF (congestive heart failure) ([place]) 12/15/2017  . Acute on chronic heart failure ([place]) 12/15/2017  . Allergic rhinitis   . Arthritis    "qwhere" (6//19/2019)  . Asbestosis(501)    followed by Dr Barkley Boards- "on monitoring only; stable x years" (04/05/2018)  . Basal cell carcinoma    "face; head" (04/05/2018)  . Degenerative arthritis of knee 11/19/2011  . Dyspnea on exertion 02/01/2019  . GERD (gastroesophageal reflux disease)   . High cholesterol   . History of asbestos exposure    followed by Dr West Carbo- states stable years- last CT chest 7/12 in Epic  . Hypertension   .  LBBB (left bundle branch block) 01/31/2018  . Shortness of breath    SOME SOB BUT CAN CLIMB FLIGHT OF STAIRS  . Squamous carcinoma    "back of neck" (04/05/2018)   Past Surgical History:  Procedure Laterality Date  . CATARACT EXTRACTION, BILATERAL Bilateral 2020  . COLONOSCOPY    . COLONOSCOPY N/A 08/22/2014   Procedure: COLONOSCOPY;  Surgeon: Rogene Houston, MD;  [facility];  Service: Endoscopy;  Laterality: N/A;  830  . CORONARY ANGIOPLASTY WITH STENT PLACEMENT  04/05/2018   CTO PCI of the mid LAD with DES x 2.  . CORONARY CTO INTERVENTION N/A 04/05/2018   Procedure: CORONARY CTO INTERVENTION;  Surgeon: Martinique, Peter M, MD;  Location: [place] [facility];  Service: Cardiovascular;  Laterality: N/A;  . FINGER AMPUTATION Left 2009   traumatic injury left small finger with amputation  . JOINT REPLACEMENT    . KNEE ARTHROPLASTY  11/19/2011   Procedure: COMPUTER ASSISTED TOTAL KNEE ARTHROPLASTY;  Surgeon: Mcarthur Rossetti, MD;  [facility]: [facility];  Service: Orthopedics;  Laterality: Right;  Right Total Knee Arthroplasty  . MASS EXCISION Left 03/12/2014   Procedure: EXCISION LEFT NECK SKIN MASS;  Surgeon: Gayland Curry, MD;  Location: [place];  Service: General;  Laterality: Left;  . RIGHT/LEFT HEART CATH AND CORONARY ANGIOGRAPHY N/A 12/27/2017   Procedure: RIGHT/LEFT HEART CATH AND CORONARY ANGIOGRAPHY;  Surgeon: Adrian Prows, MD;  Location: [place] [facility];  Service: Cardiovascular;  Laterality: N/A;  . TOTAL KNEE ARTHROPLASTY  08/18/2012   Procedure: TOTAL KNEE ARTHROPLASTY;  Surgeon: Mcarthur Rossetti, MD;  [facility];  Service: Orthopedics;  Laterality: Left;  Left Total Knee Arthroplasty  . ULTRASOUND GUIDANCE FOR VASCULAR ACCESS  04/05/2018   Procedure: Ultrasound Guidance For Vascular Access;  Surgeon: Martinique, Peter M, MD;  Location: [place];  Service: Cardiovascular;;   Social History   Socioeconomic History  . Marital status:  Married    Spouse name: Not on file  . Number of children: 3  . Years of education: Not on file  . Highest education level: Not on file  Occupational History  . Occupation: Horticulturist, commercial: [employer]  Tobacco Use  . Smoking status: Former Smoker    Packs/day: 1.00    Years: 20.00    Pack years: 20.00    Types: Cigarettes    Quit date: 10/18/1985    Years since quitting: 34.1  . Smokeless tobacco: Never Used  Substance and Sexual Activity  . Alcohol use: Yes    Alcohol/week: 9.0 standard drinks    Types: 3 Cans of beer, 6 Standard drinks or equivalent per week  . Drug use: Not Currently  . Sexual activity: Not on file  Other Topics Concern  . Not on file  Social History Narrative  . Not on file   Social Determinants of Health   Financial Resource Strain:   . Difficulty of Paying Living Expenses: Not on file  Food Insecurity:   . Worried About Charity fundraiser in the Last Year: Not on file  . Ran Out of Food in the Last Year: Not on file  Transportation Needs:   . Lack of Transportation (Medical): Not on file  . Lack of Transportation (Non-Medical): Not on file  Physical Activity:   . Days of Exercise per Week: Not on file  . Minutes of Exercise per Session: Not on file  Stress:   . Feeling of Stress : Not on file  Social Connections:   . Frequency of Communication with Friends and Family: Not on file  . Frequency of Social Gatherings with Friends and Family: Not on file  . Attends Religious Services: Not on file  . Active Member of Clubs or Organizations: Not on file  . Attends Archivist Meetings: Not on file  . Marital Status: Not on file  Intimate Partner Violence:   . Fear of Current or Ex-Partner: Not on file  . Emotionally Abused: Not on file  . Physically Abused: Not on file  . Sexually Abused: Not on file   ROS  Review of Systems  Constitution: Negative for chills, decreased appetite, malaise/fatigue and weight gain.    Cardiovascular: Positive for chest pain and dyspnea on exertion (stable). Negative for leg swelling.  Endocrine: Negative for cold intolerance.  Musculoskeletal: Positive for muscle cramps (occasional at night).  Gastrointestinal: Positive for heartburn. Negative for melena.  All other systems reviewed and are negative.  Objective   Vitals with BMI 11/29/2019 08/20/2019 07/10/2019  Height 6\' 2"  6\' 2"  6' 1.5"  Weight 230 lbs 227 lbs 2 oz 227 lbs  BMI 29.52 [id number]  Systolic [id number]  Diastolic 80 74 94  Pulse 62 49 62    Blood pressure 123/80, pulse 62, temperature (!) 96.3 F (35.7 C), height 6\' 2"  (1.88 m), weight 230 lb (104.3 kg), SpO2 95 %.  Body mass index is 29.53 kg/m.   Physical Exam  HENT:  Head: Atraumatic.  Cardiovascular: Normal rate, regular rhythm, normal heart sounds, intact distal pulses and normal pulses. Exam reveals no gallop.  No murmur heard. No leg edema, no JVD.  Pulmonary/Chest: Effort normal and breath sounds normal.  Abdominal: Soft. Bowel sounds are normal.  Musculoskeletal:     Cervical back: Neck supple.  Neurological: He is alert.  Psychiatric: He has a normal mood and affect.   Laboratory examination:   Recent Labs    01/25/19 0816 08/16/19 0850  NA 141 140  K 5.1 4.9  CL 103 105  CO2 23 24  GLUCOSE 97 107*  BUN 19 25  CREATININE 1.77* 1.54*  CALCIUM 9.6 9.7  GFRNONAA 39* 45*  GFRAA 45* 52*   CMP Latest Ref Rng & Units 08/16/2019 01/25/2019 04/06/2018  Glucose 65 - 99 mg/dL 107(H) 97 134(H)  BUN 8 - 27 mg/dL 25 19 21(H)  Creatinine 0.76 - 1.27 mg/dL 1.54(H) 1.77(H) 1.48(H)  Sodium 134 - 144 mmol/L 140 141 140  Potassium 3.5 - 5.2 mmol/L 4.9 5.1 4.4  Chloride 96 - 106 mmol/L 105 103 108  CO2 20 - 29 mmol/L 24 23 26   Calcium 8.6 - 10.2 mg/dL 9.7 9.6 8.8(L)  Total Protein 6.0 - 8.5 g/dL 7.1 7.3 -  Total Bilirubin 0.0 - 1.2 mg/dL 0.5 0.5 -  Alkaline Phos 39 - 117 IU/L 93 89 -  AST 0 - 40 IU/L 16 22 -  ALT 0 - 44 IU/L 13 14 -    CBC Latest Ref Rng & Units 01/25/2019 04/06/2018 03/29/2018  WBC 3.4 - 10.8 x10E3/uL 6.1 7.0 7.8  Hemoglobin 13.0 - 17.7 g/dL 14.2 11.7(L) 12.9(L)  Hematocrit 37.5 - 51.0 % 41.8 36.5(L) 39.0  Platelets 150 - 450 x10E3/uL 188 151 214   Lipid Panel     Component Value Date/Time   CHOL 106 08/16/2019 0850   TRIG 78 08/16/2019 0850   HDL 43 08/16/2019 0850   LDLCALC 47 08/16/2019 0850   LDLDIRECT 55 08/16/2019 0850   HEMOGLOBIN A1C No results found for: HGBA1C, MPG TSH Recent Labs    08/16/19 0850  TSH 3.680   External labs 08/16/2019: HDL 43.000, LDL 55.000  Cholesterol, total 106.000  Triglycerides 78.000  BUN 25.000  Creatinine, Serum 1.540  TSH 3.680   Medications and allergies   Allergies  Allergen Reactions  . Penicillins Rash    Has patient had a PCN reaction causing immediate rash, facial/tongue/throat swelling, SOB or lightheadedness with hypotension: unkn Has patient had a PCN reaction causing severe rash involving mucus membranes or skin necrosis: unkn Has patient had a PCN reaction that required hospitalization: no Has patient had a PCN reaction occurring within the last 10 years: no If all of the above answers are "NO", then may proceed with Cephalosporin use.      Current Outpatient Medications  Medication Instructions  . albuterol (PROVENTIL HFA;VENTOLIN HFA) 108 (90 Base) MCG/ACT inhaler 2 puffs, Inhalation, Every 4 hours PRN  . aspirin EC 81 mg, Oral, Daily  . BIDIL 20-37.5 MG tablet TAKE ONE TABLET BY MOUTH THREE TIMES A DAY   . carvedilol (COREG) 12.5 mg, Oral, 2 times daily  . fexofenadine (ALLEGRA) 180 mg, Oral, Daily  . nitroGLYCERIN (NITROSTAT) 0.4 mg, Sublingual, Every 5 min PRN  . pantoprazole (PROTONIX) 40 MG tablet TAKE ONE TABLET TWICE DAILY BEFORE A MEAL.  . rosuvastatin (CRESTOR) 10 mg, Oral, Daily  . sildenafil (  REVATIO) 20 mg, Oral, As needed    Radiology:  No results found.   Cardiac Studies:   Echocardiogram 11/14/2019:   Left ventricle cavity is minimally dilated at 5.4 cm. Moderate LVH. Mildly depressed LV systolic function with EF 45%. Doppler evidence of  grade I (impaired) diastolic dysfunction, elevated LAP. Left ventricle  regional wall motion findings: Basal anteroseptal, Basal inferoseptal, Mid  anteroseptal, Mid inferoseptal, Apical anterior and Apical septal mild  hypokinesis. Calculated EF 45%.  Trileaflet aortic valve with no regurgitation noted. Mild aortic valve  leaflet thickening.  Grossly normal pulmonic valve. Mild to moderate pulmonic regurgitation.  No significant change from 07/18/2018.  Compared tp previous study on 03/22/2018, LVEF has significantly improved from 20%.  Coronary angiogram 04/08/2018 Successful CTO PCI of the mid LAD with DES x 2. (Synergy 2.5 x 24 and 2.25 x 38 mm DES. Medical therapy for CTO RCA, flow improved since prior angiography on 12/28/2017. Diffusely diseased RCA Mild disease Cx.  Assessment     ICD-10-CM   1. Coronary artery disease of native artery of native heart with stable angina pectoris ([place])  I25.118 EKG 12-Lead    PCV MYOCARDIAL PERFUSION WITH LEXISCAN  2. Chronic renal failure, stage 3a  N18.31   3. Ischemic cardiomyopathy  I25.5 PCV MYOCARDIAL PERFUSION WITH LEXISCAN   No orders of the defined types were placed in this encounter.    EKG 11/29/2019: Sinus bradycardia at the rate of 53 bpm, otherwise normal EKG. Compared to EKG 08/20/2019: Marked sinus bradycardia at the rate of 42 bpm  Recommendations:   Franklin Jones  is a 69 y.o. Caucasian Jones  with hypertension, stage III chronic kidney disease, hyperlipidemia, coronary artery disease, complete occlusion of proximal LAD to mid LAD and acute marginal of RCA by coronary angiography on 12/28/2017 at which time his EF was 20-25% and initially presented with florid CHF. He is S/P Mid LAD PCI in June 2019, EF now has improved to 45% by recent echo in Jan 2021.   Due to continued symptoms of  exertional chest pain, will obtain Lexiscan Myoview stress test.  Echocardiogram reviewed, EF is stable.  Lipids are well controlled, blood pressure is also well controlled.  Serum creatinine has also remained stable with stage III chronic kidney disease.  Unless the stress test is high risk, I will see him back in 3 months for follow-up.  Adrian Prows, MD, [facility] 11/29/2019, 9:19 AM [facility] [place] Office: [phone]

## 2019-12-14 DIAGNOSIS — Z23 Encounter for immunization: Secondary | ICD-10-CM | POA: Diagnosis not present

## 2019-12-17 ENCOUNTER — Other Ambulatory Visit: Payer: Self-pay

## 2019-12-17 ENCOUNTER — Ambulatory Visit: Payer: Medicare Other

## 2019-12-17 DIAGNOSIS — I25118 Atherosclerotic heart disease of native coronary artery with other forms of angina pectoris: Secondary | ICD-10-CM

## 2019-12-17 DIAGNOSIS — I255 Ischemic cardiomyopathy: Secondary | ICD-10-CM

## 2019-12-19 NOTE — Progress Notes (Signed)
Minimal apical ischemia, LVEF  mildly decreased. Continue medical therapy

## 2020-01-10 ENCOUNTER — Other Ambulatory Visit: Payer: Self-pay

## 2020-01-10 DIAGNOSIS — I25110 Atherosclerotic heart disease of native coronary artery with unstable angina pectoris: Secondary | ICD-10-CM

## 2020-01-10 MED ORDER — ROSUVASTATIN CALCIUM 10 MG PO TABS: 10.0000 mg | ORAL_TABLET | Freq: Every day | ORAL | 3 refills | Status: DC

## 2020-02-27 ENCOUNTER — Ambulatory Visit: Payer: Medicare Other | Admitting: Cardiology

## 2020-03-14 NOTE — Progress Notes (Signed)
Primary Physician/Referring:  Glenda Chroman, MD  Patient ID: Franklin Jones, male    DOB: 12/24/1950, 69 y.o.   MRN: [id number]  Chief Complaint  Patient presents with  . Follow-up    3 month  . Coronary Artery Disease  . Chest Pain   HPI:    Franklin Jones  is a 69 y.o. Caucasian male  with hypertension, stage III chronic kidney disease, hyperlipidemia, coronary artery disease, complete occlusion of proximal LAD to mid LAD and acute marginal of RCA by coronary angiography on 12/28/2017 at which time his EF was 20-25% and initially presented with florid CHF. He is S/P Mid LAD PCI in June 2019 with improvement in EF to 45%.   Due to symptoms of angina pectoris in the form of burning sensation in the chest with exertional activities, he underwent nuclear stress test.  He now presents for follow-up, states that since last office visit he has not had any recurrence of chest pain, he has resumed all his activity.  Denies shortness of breath, leg edema.  Past Medical History:  Diagnosis Date  . Acute on chronic combined systolic and diastolic CHF (congestive heart failure) ([place]) 12/15/2017  . Acute on chronic heart failure ([place]) 12/15/2017  . Allergic rhinitis   . Arthritis    "qwhere" (6//19/2019)  . Asbestosis(501)    followed by Dr Barkley Boards- "on monitoring only; stable x years" (04/05/2018)  . Basal cell carcinoma    "face; head" (04/05/2018)  . Degenerative arthritis of knee 11/19/2011  . Dyspnea on exertion 02/01/2019  . GERD (gastroesophageal reflux disease)   . High cholesterol   . History of asbestos exposure    followed by Dr West Carbo- states stable years- last CT chest 7/12 in Epic  . Hypertension   . LBBB (left bundle branch block) 01/31/2018  . Shortness of breath    SOME SOB BUT CAN CLIMB FLIGHT OF STAIRS  . Squamous carcinoma    "back of neck" (04/05/2018)   Past Surgical History:  Procedure Laterality Date  . CATARACT EXTRACTION, BILATERAL Bilateral 2020  .  COLONOSCOPY    . COLONOSCOPY N/A 08/22/2014   Procedure: COLONOSCOPY;  Surgeon: Rogene Houston, MD;  [facility];  Service: Endoscopy;  Laterality: N/A;  830  . CORONARY ANGIOPLASTY WITH STENT PLACEMENT  04/05/2018   CTO PCI of the mid LAD with DES x 2.  . CORONARY CTO INTERVENTION N/A 04/05/2018   Procedure: CORONARY CTO INTERVENTION;  Surgeon: Martinique, Peter M, MD;  Location: [facility];  Service: Cardiovascular;  Laterality: N/A;  . FINGER AMPUTATION Left 2009   traumatic injury left small finger with amputation  . JOINT REPLACEMENT    . KNEE ARTHROPLASTY  11/19/2011   Procedure: COMPUTER ASSISTED TOTAL KNEE ARTHROPLASTY;  Surgeon: Mcarthur Rossetti, MD;  [facility];  Service: Orthopedics;  Laterality: Right;  Right Total Knee Arthroplasty  . MASS EXCISION Left 03/12/2014   Procedure: EXCISION LEFT NECK SKIN MASS;  Surgeon: Gayland Curry, MD;  Location: [place];  Service: General;  Laterality: Left;  . RIGHT/LEFT HEART CATH AND CORONARY ANGIOGRAPHY N/A 12/27/2017   Procedure: RIGHT/LEFT HEART CATH AND CORONARY ANGIOGRAPHY;  Surgeon: Adrian Prows, MD;  Location: [facility];  Service: Cardiovascular;  Laterality: N/A;  . TOTAL KNEE ARTHROPLASTY  08/18/2012   Procedure: TOTAL KNEE ARTHROPLASTY;  Surgeon: Mcarthur Rossetti, MD;  [facility];  Service: Orthopedics;  Laterality: Left;  Left  Total Knee Arthroplasty  . ULTRASOUND GUIDANCE FOR VASCULAR ACCESS  04/05/2018   Procedure: Ultrasound Guidance For Vascular Access;  Surgeon: Martinique, Peter M, MD;  Location: [place] [facility];  Service: Cardiovascular;;   Family History  Problem Relation Age of Onset  . Allergies Mother   . Heart disease Father   . Lung cancer Brother   . Heart failure Brother    Social History   Tobacco Use  . Smoking status: Former Smoker    Packs/day: 1.00    Years: 20.00    Pack years: 20.00    Types: Cigarettes    Quit date: 10/18/1985    Years since  quitting: 34.4  . Smokeless tobacco: Never Used  Substance Use Topics  . Alcohol use: Yes    Alcohol/week: 9.0 standard drinks    Types: 3 Cans of beer, 6 Standard drinks or equivalent per week    Comment: weekly   Marital Status: Married  ROS  Review of Systems  Cardiovascular: Positive for chest pain and dyspnea on exertion (stable). Negative for leg swelling.  Musculoskeletal: Positive for muscle cramps (occasional at night).  Gastrointestinal: Positive for heartburn. Negative for melena.  All other systems reviewed and are negative.  Objective   Vitals with BMI 03/19/2020 11/29/2019 08/20/2019  Height 6\' 2"  6\' 2"  6\' 2"   Weight 228 lbs 230 lbs 227 lbs 2 oz  BMI 29.26 [id number]  Systolic 123456 [id number] 123456  Diastolic 80 80 74  Pulse 58 62 49    Blood pressure 120/80, pulse (!) 58, resp. rate 15, height 6\' 2"  (1.88 m), weight 228 lb (103.4 kg), SpO2 96 %. Body mass index is 29.27 kg/m.   Physical Exam  HENT:  Head: Atraumatic.  Cardiovascular: Normal rate, regular rhythm, normal heart sounds, intact distal pulses and normal pulses. Exam reveals no gallop.  No murmur heard. No leg edema, no JVD.  Pulmonary/Chest: Effort normal and breath sounds normal.  Abdominal: Soft. Bowel sounds are normal.   Laboratory examination:   Recent Labs    08/16/19 0850  NA 140  K 4.9  CL 105  CO2 24  GLUCOSE 107*  BUN 25  CREATININE 1.54*  CALCIUM 9.7  GFRNONAA 45*  GFRAA 52*   CMP Latest Ref Rng & Units 08/16/2019 01/25/2019 04/06/2018  Glucose 65 - 99 mg/dL 107(H) 97 134(H)  BUN 8 - 27 mg/dL 25 19 21(H)  Creatinine 0.76 - 1.27 mg/dL 1.54(H) 1.77(H) 1.48(H)  Sodium 134 - 144 mmol/L 140 141 140  Potassium 3.5 - 5.2 mmol/L 4.9 5.1 4.4  Chloride 96 - 106 mmol/L 105 103 108  CO2 20 - 29 mmol/L 24 23 26   Calcium 8.6 - 10.2 mg/dL 9.7 9.6 8.8(L)  Total Protein 6.0 - 8.5 g/dL 7.1 7.3 -  Total Bilirubin 0.0 - 1.2 mg/dL 0.5 0.5 -  Alkaline Phos 39 - 117 IU/L 93 89 -  AST 0 - 40 IU/L 16 22  -  ALT 0 - 44 IU/L 13 14 -   CBC Latest Ref Rng & Units 01/25/2019 04/06/2018 03/29/2018  WBC 3.4 - 10.8 x10E3/uL 6.1 7.0 7.8  Hemoglobin 13.0 - 17.7 g/dL 14.2 11.7(L) 12.9(L)  Hematocrit 37.5 - 51.0 % 41.8 36.5(L) 39.0  Platelets 150 - 450 x10E3/uL 188 151 214   Lipid Panel     Component Value Date/Time   CHOL 106 08/16/2019 0850   TRIG 78 08/16/2019 0850   HDL 43 08/16/2019 0850   LDLCALC 47 08/16/2019 0850  LDLDIRECT 55 08/16/2019 0850   HEMOGLOBIN A1C No results found for: HGBA1C, MPG TSH Recent Labs    08/16/19 0850  TSH 3.680   External labs 08/16/2019: HDL 43.000, LDL 55.000  Cholesterol, total 106.000  Triglycerides 78.000  BUN 25.000  Creatinine, Serum 1.540  TSH 3.680   Medications and allergies   Allergies  Allergen Reactions  . Penicillins Rash    Has patient had a PCN reaction causing immediate rash, facial/tongue/throat swelling, SOB or lightheadedness with hypotension: unkn Has patient had a PCN reaction causing severe rash involving mucus membranes or skin necrosis: unkn Has patient had a PCN reaction that required hospitalization: no Has patient had a PCN reaction occurring within the last 10 years: no If all of the above answers are "NO", then may proceed with Cephalosporin use.      Radiology:  No results found.  Cardiac Studies:   Coronary angiogram 04/08/2018 Successful CTO PCI of the mid LAD with DES x 2. (Synergy 2.5 x 24 and 2.25 x 38 mm DES. Medical therapy for CTO RCA, flow improved since prior angiography on 12/28/2017. Diffusely diseased RCA Mild disease Cx.  Echocardiogram 11/14/2019:  Left ventricle cavity is minimally dilated at 5.4 cm. Moderate LVH. Mildly depressed LV systolic function with EF 45%. Doppler evidence of grade I (impaired) diastolic dysfunction, elevated LAP.  Left ventricle  regional wall motion findings: Basal anteroseptal, Basal inferoseptal, Mid anteroseptal, Mid inferoseptal, Apical anterior and Apical septal  mild  hypokinesis.  Calculated EF 45%.  Trileaflet aortic valve with no regurgitation noted. Mild aortic valve  leaflet thickening.  Grossly normal pulmonic valve. Mild to moderate pulmonic regurgitation.  No significant change from 07/18/2018.  Compared tp previous study on 03/22/2018, LVEF has significantly improved from 20%.  Lexiscan (Walking with mod Bruce)Tetrofosmin Stress Test  12/17/2019: Non-diagnostic ECG stress. Myocardial perfusion is abnormal. There is a small sized fixed moderate defect in the distal anterior and apical regions.  Overall LV systolic function is  mildly abnormal with distal anterior and apical hypokinesis. Stress LV EF: 44%.  No previous exam available for comparison. Intermediate risk study.   EKG   11/29/2019: Sinus bradycardia at the rate of 53 bpm, otherwise normal EKG. Compared to EKG 08/20/2019: Marked sinus bradycardia at the rate of 42 bpm  Assessment     ICD-10-CM   1. Coronary artery disease of native artery of native heart with stable angina pectoris ([place])  I25.118   2. Chronic diastolic CHF (congestive heart failure) ([facility])  I50.32 losartan (COZAAR) 25 MG tablet    Basic metabolic panel  3. LBBB (left bundle branch block)  I44.7      Outpatient Encounter Medications as of 03/19/2020  Medication Sig  . albuterol (PROVENTIL HFA;VENTOLIN HFA) 108 (90 Base) MCG/ACT inhaler Inhale 2 puffs into the lungs every 4 (four) hours as needed for wheezing or shortness of breath.  [name] Kitchen aspirin EC 81 MG tablet Take 1 tablet (81 mg total) by mouth daily.  [name] Kitchen BIDIL 20-37.5 MG tablet TAKE ONE TABLET BY MOUTH THREE TIMES A DAY   . carvedilol (COREG) 12.5 MG tablet Take 1 tablet (12.5 mg total) by mouth 2 (two) times daily.  . fexofenadine (ALLEGRA) 180 MG tablet Take 180 mg by mouth daily.  . nitroGLYCERIN (NITROSTAT) 0.4 MG SL tablet Place 1 tablet (0.4 mg total) under the tongue every 5 (five) minutes as needed for chest pain.  . pantoprazole (PROTONIX) 40 MG  tablet TAKE ONE TABLET TWICE DAILY BEFORE A MEAL.  [name] Kitchen  rosuvastatin (CRESTOR) 10 MG tablet Take 1 tablet (10 mg total) by mouth daily.  . sildenafil (REVATIO) 20 MG tablet Take 20 mg by mouth as needed.  [name] Kitchen losartan (COZAAR) 25 MG tablet Take 1 tablet (25 mg total) by mouth every evening.   No facility-administered encounter medications on file as of 03/19/2020.   Recommendations:   Franklin Jones  is a 69 y.o. Caucasian male  with hypertension, stage III chronic kidney disease, hyperlipidemia, coronary artery disease, complete occlusion of proximal LAD to mid LAD and acute marginal of RCA by coronary angiography on 12/28/2017 at which time his EF was 20-25% and initially presented with florid CHF. He is S/P Mid LAD PCI in June 2019 with improvement in EF to 45%.   Patient is presently doing well and has not had any recurrence of angina pectoris.  I reviewed the results of the nuclear stress test, he has low risk stress test.  Also since last office visit he has not had any recurrence of angina pectoris.  His lipids are under excellent control.  His hypertension is also well controlled.  He is not on an ACE inhibitor or an ARB due to renal insufficiency but I would like to challenge him with losartan 25 mg daily, will check his BMP in about 2 weeks.  Otherwise from cardiac standpoint he is doing well, I will see him back on annual basis.  Advised him to increase his physical activity without any limitations.  Today there is no clinical evidence of heart failure and he is well compensated.  Adrian Prows, MD, [facility] 03/19/2020, 6:29 PM [facility] [place] Pager: [phone] Office: [phone]

## 2020-03-19 ENCOUNTER — Other Ambulatory Visit: Payer: Self-pay

## 2020-03-19 ENCOUNTER — Encounter: Payer: Self-pay | Admitting: Cardiology

## 2020-03-19 ENCOUNTER — Ambulatory Visit: Payer: Medicare Other | Admitting: Cardiology

## 2020-03-19 VITALS — BP 120/80 | HR 58 | Resp 15 | Ht 74.0 in | Wt 228.0 lb

## 2020-03-19 DIAGNOSIS — I25118 Atherosclerotic heart disease of native coronary artery with other forms of angina pectoris: Secondary | ICD-10-CM

## 2020-03-19 DIAGNOSIS — I255 Ischemic cardiomyopathy: Secondary | ICD-10-CM | POA: Diagnosis not present

## 2020-03-19 DIAGNOSIS — I5032 Chronic diastolic (congestive) heart failure: Secondary | ICD-10-CM | POA: Diagnosis not present

## 2020-03-19 DIAGNOSIS — I447 Left bundle-branch block, unspecified: Secondary | ICD-10-CM

## 2020-03-19 MED ORDER — LOSARTAN POTASSIUM 25 MG PO TABS: 25.0000 mg | ORAL_TABLET | Freq: Every evening | ORAL | 3 refills | Status: DC

## 2020-03-25 ENCOUNTER — Other Ambulatory Visit: Payer: Self-pay

## 2020-03-25 MED ORDER — CARVEDILOL 12.5 MG PO TABS: 12.5000 mg | ORAL_TABLET | Freq: Two times a day (BID) | ORAL | 1 refills | Status: DC

## 2020-03-31 ENCOUNTER — Telehealth (INDEPENDENT_AMBULATORY_CARE_PROVIDER_SITE_OTHER): Payer: Self-pay | Admitting: Gastroenterology

## 2020-03-31 DIAGNOSIS — K219 Gastro-esophageal reflux disease without esophagitis: Secondary | ICD-10-CM

## 2020-03-31 MED ORDER — PANTOPRAZOLE SODIUM 40 MG PO TBEC: 40.0000 mg | DELAYED_RELEASE_TABLET | Freq: Two times a day (BID) | ORAL | 1 refills | Status: DC

## 2020-03-31 NOTE — Telephone Encounter (Signed)
Please notify patient I sent a refill to his pharmacy but he was last seen in February 2019 he will need an office visit prior to future refills or to get from his PCP. Thanks.

## 2020-03-31 NOTE — Telephone Encounter (Signed)
Patient called regarding refill on pantoprazole - please advise - [phone]

## 2020-04-03 DIAGNOSIS — I5032 Chronic diastolic (congestive) heart failure: Secondary | ICD-10-CM | POA: Diagnosis not present

## 2020-04-04 LAB — BASIC METABOLIC PANEL
BUN/Creatinine Ratio: 14 (ref 10–24)
BUN: 21 mg/dL (ref 8–27)
CO2: 22 mmol/L (ref 20–29)
Calcium: 9.3 mg/dL (ref 8.6–10.2)
Chloride: 106 mmol/L (ref 96–106)
Creatinine, Ser: 1.53 mg/dL — ABNORMAL HIGH (ref 0.76–1.27)
GFR calc Af Amer: 53 mL/min/{1.73_m2} — ABNORMAL LOW (ref >59)
GFR calc non Af Amer: 46 mL/min/{1.73_m2} — ABNORMAL LOW (ref >59)
Glucose: 107 mg/dL — ABNORMAL HIGH (ref 65–99)
Potassium: 4.4 mmol/L (ref 3.5–5.2)
Sodium: 140 mmol/L (ref 134–144)

## 2020-04-05 NOTE — Progress Notes (Signed)
Stable renal function.

## 2020-05-26 ENCOUNTER — Telehealth: Payer: Self-pay | Admitting: Emergency Medicine

## 2020-05-26 ENCOUNTER — Other Ambulatory Visit: Payer: Self-pay | Admitting: Cardiology

## 2020-05-26 DIAGNOSIS — Z77090 Contact with and (suspected) exposure to asbestos: Secondary | ICD-10-CM

## 2020-05-26 NOTE — Telephone Encounter (Signed)
Spoke with the pt  I have scheduled for rov with cxr per last ov note  He has had covid vaccine- will bring card so can do spiro during appt since ov mentioned this is needed as well  Nothing further needed

## 2020-06-11 ENCOUNTER — Encounter (INDEPENDENT_AMBULATORY_CARE_PROVIDER_SITE_OTHER): Payer: Self-pay | Admitting: Gastroenterology

## 2020-06-11 ENCOUNTER — Ambulatory Visit (INDEPENDENT_AMBULATORY_CARE_PROVIDER_SITE_OTHER): Payer: Medicare Other | Admitting: Gastroenterology

## 2020-06-11 ENCOUNTER — Other Ambulatory Visit: Payer: Self-pay

## 2020-06-11 DIAGNOSIS — K219 Gastro-esophageal reflux disease without esophagitis: Secondary | ICD-10-CM

## 2020-06-11 MED ORDER — PANTOPRAZOLE SODIUM 40 MG PO TBEC: 40.0000 mg | DELAYED_RELEASE_TABLET | Freq: Two times a day (BID) | ORAL | 3 refills | Status: DC

## 2020-06-11 NOTE — Patient Instructions (Signed)
GERD instructions: -Please avoid lying flat within 2 to 3 hours of eating, this will make reflux symptoms worse. -Some patients find elevating the head of the bed beneficial. -Avoid spicy greasy foods as well as caffeine, coffee, sodas-these food/drinks can worsen heartburn and reflux. -Tobacco will worsen reflux, please try to decrease/eliminate tobacco intake if applicable. -Avoid NSAID products (ibuprofen, aspirin, Advil, Aleve, Goody's, BCs, Alka-Seltzer) - if needing these occasionally please try to take with meal or snack to decrease stomach irritation. -If taking medication for reflux such as prilosec, nexium, aciphex, dexilant, prevacid - take 20-30 minutes before a meal for maximum effectiveness.    Can try pepcid on as needed basis instead of the 2nd dose of protonix. Diet modifications as discussed.

## 2020-06-11 NOTE — Progress Notes (Signed)
Patient profile: Franklin Jones is a 69 y.o. male seen for evaluation of GERD. Last seen in clinic 2019  History of Present Illness: Franklin Jones is seen today for reflux follow up. Takes protonix 40mg  usually 1-2x/day. Takes 2x/day if eats spicy or greasy foods. Mild symptoms if eats spicy greasy foods and takes only once a day.  Reports bloating and uneasy feeling in abd thatoccurs with walking and resolves with sitting down.  He does typically try to walk after his lunch meal.  Patient denies any constipation, diarrhea, rectal bleeding, lower abdominal pain.  States his bowels are fairly regular  He denies frequent alcohol, NSAIDs, tobacco use.  Overall doing well without any specific complaints.  Wt Readings from Last 3 Encounters:  06/11/20 225 lb (102.1 kg)  03/19/20 228 lb (103.4 kg)  11/29/19 230 lb (104.3 kg)     Last Colonoscopy: 08/2014-Impression:  Examination performed to cecum. Mild sigmoid colon diverticulosis. 3 mm polyp ablated via cold biopsy from descending colon Patient had 3 mm polyp removed and is tubular adenoma Next colonoscopy in 10 years  Last Endoscopy: None prior   Past Medical History:  Past Medical History:  Diagnosis Date  . Acute on chronic combined systolic and diastolic CHF (congestive heart failure) ([place]) 12/15/2017  . Acute on chronic heart failure ([place]) 12/15/2017  . Allergic rhinitis   . Arthritis    "qwhere" (6//19/2019)  . Asbestosis(501)    followed by Dr Barkley Boards- "on monitoring only; stable x years" (04/05/2018)  . Basal cell carcinoma    "face; head" (04/05/2018)  . Degenerative arthritis of knee 11/19/2011  . Dyspnea on exertion 02/01/2019  . GERD (gastroesophageal reflux disease)   . High cholesterol   . History of asbestos exposure    followed by Dr West Carbo- states stable years- last CT chest 7/12 in Epic  . Hypertension   . LBBB (left bundle branch block) 01/31/2018  . Shortness of breath    SOME SOB BUT CAN CLIMB FLIGHT  OF STAIRS  . Squamous carcinoma    "back of neck" (04/05/2018)    Problem List: Patient Active Problem List   Diagnosis Date Noted  . Dyspnea on exertion 02/01/2019  . 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 Angina pectoris ([place]) 04/05/2018  . Chronic systolic CHF (congestive heart failure) ([place]) 12/25/2017  . Asthma 07/30/2016  . 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 GERD (gastroesophageal reflux disease) 06/06/2013  . Arthritis of knee, left 08/18/2012  . Degenerative arthritis of knee 11/19/2011  . Allergic rhinitis 04/09/2010  . Nonspecific (abnormal) findings on radiological and other examination of body structure 04/09/2010  . Asbestos exposure 04/09/2010  . TOBACCO ABUSE, HX OF 03/26/2010    Past Surgical History: Past Surgical History:  Procedure Laterality Date  . CATARACT EXTRACTION, BILATERAL Bilateral 2020  . COLONOSCOPY    . COLONOSCOPY N/A 08/22/2014   Procedure: COLONOSCOPY;  Surgeon: Rogene Houston, MD;  [facility];  Service: Endoscopy;  Laterality: N/A;  830  . CORONARY ANGIOPLASTY WITH STENT PLACEMENT  04/05/2018   CTO PCI of the mid LAD with DES x 2.  . CORONARY CTO INTERVENTION N/A 04/05/2018   Procedure: CORONARY CTO INTERVENTION;  Surgeon: Martinique, Peter M, MD;  Location: [facility];  Service: Cardiovascular;  Laterality: N/A;  . FINGER AMPUTATION Left 2009   traumatic injury left small  finger with amputation  . JOINT REPLACEMENT    . KNEE ARTHROPLASTY  11/19/2011   Procedure: COMPUTER ASSISTED TOTAL KNEE ARTHROPLASTY;  Surgeon: Mcarthur Rossetti, MD;  Location: [facility];  Service: Orthopedics;  Laterality: Right;  Right Total Knee Arthroplasty  . MASS EXCISION Left 03/12/2014   Procedure: EXCISION LEFT NECK SKIN MASS;  Surgeon: Gayland Curry, MD;  Location: [facility];  Service: General;  Laterality:  Left;  . RIGHT/LEFT HEART CATH AND CORONARY ANGIOGRAPHY N/A 12/27/2017   Procedure: RIGHT/LEFT HEART CATH AND CORONARY ANGIOGRAPHY;  Surgeon: Adrian Prows, MD;  Location: [facility];  Service: Cardiovascular;  Laterality: N/A;  . TOTAL KNEE ARTHROPLASTY  08/18/2012   Procedure: TOTAL KNEE ARTHROPLASTY;  Surgeon: Mcarthur Rossetti, MD;  Location: [facility];  Service: Orthopedics;  Laterality: Left;  Left Total Knee Arthroplasty  . ULTRASOUND GUIDANCE FOR VASCULAR ACCESS  04/05/2018   Procedure: Ultrasound Guidance For Vascular Access;  Surgeon: Martinique, Peter M, MD;  Location: [facility];  Service: Cardiovascular;;    Allergies: Allergies  Allergen Reactions  . Penicillins Rash    Has patient had a PCN reaction causing immediate rash, facial/tongue/throat swelling, SOB or lightheadedness with hypotension: unkn Has patient had a PCN reaction causing severe rash involving mucus membranes or skin necrosis: unkn Has patient had a PCN reaction that required hospitalization: no Has patient had a PCN reaction occurring within the last 10 years: no If all of the above answers are "NO", then may proceed with Cephalosporin use.       Home Medications:  Current Outpatient Medications:  .  albuterol (PROVENTIL HFA;VENTOLIN HFA) 108 (90 Base) MCG/ACT inhaler, Inhale 2 puffs into the lungs every 4 (four) hours as needed for wheezing or shortness of breath., Disp: 1 Inhaler, Rfl: 5 .  aspirin EC 81 MG tablet, Take 1 tablet (81 mg total) by mouth daily., Disp: 90 tablet, Rfl: 3 .  BIDIL 20-37.5 MG tablet, TAKE 1 TABLET BY MOUTH THREE TIMES A DAY, Disp: 270 tablet, Rfl: 3 .  carvedilol (COREG) 12.5 MG tablet, Take 1 tablet (12.5 mg total) by mouth 2 (two) times daily., Disp: 180 tablet, Rfl: 1 .  fexofenadine (ALLEGRA) 180 MG tablet, Take 180 mg by mouth daily., Disp: , Rfl:  .  losartan (COZAAR) 25 MG tablet, Take 1 tablet (25 mg total) by mouth every evening., Disp: 90 tablet, Rfl: 3 .   nitroGLYCERIN (NITROSTAT) 0.4 MG SL tablet, Place 1 tablet (0.4 mg total) under the tongue every 5 (five) minutes as needed for chest pain., Disp: 25 tablet, Rfl: 3 .  pantoprazole (PROTONIX) 40 MG tablet, Take 1 tablet (40 mg total) by mouth 2 (two) times daily before a meal., Disp: 180 tablet, Rfl: 3 .  rosuvastatin (CRESTOR) 10 MG tablet, Take 1 tablet (10 mg total) by mouth daily., Disp: 90 tablet, Rfl: 3 .  sildenafil (REVATIO) 20 MG tablet, Take 20 mg by mouth as needed., Disp: , Rfl:    Family History: family history includes Allergies in his mother; Heart disease in his father; Heart failure in his brother; Lung cancer in his brother.    Social History:   reports that he quit smoking about 34 years ago. His smoking use included cigarettes. He has a 20.00 pack-year smoking history. He has never used smokeless tobacco. He reports current alcohol use of about 9.0 standard drinks of alcohol per week. He reports previous drug use.   Review of Systems: Constitutional: Denies weight  loss/weight gain  Eyes: No changes in vision. ENT: No oral lesions, sore throat.  GI: see HPI.  Heme/Lymph: No easy bruising.  CV: No chest pain.  GU: No hematuria.  Integumentary: No rashes.  Neuro: No headaches.  Psych: No depression/anxiety.  Endocrine: No heat/cold intolerance.  Allergic/Immunologic: No urticaria.  Resp: No cough, SOB.  Musculoskeletal: No joint swelling.    Physical Examination: BP 124/74 (BP Location: Right Arm, Patient Position: Sitting, Cuff Size: Large)   Pulse (!) 54   Temp 97.6 F (36.4 C) (Oral)   Ht 6\' 2"  (1.88 m)   Wt 225 lb (102.1 kg)   BMI 28.89 kg/m  Gen: NAD, alert and oriented x 4 HEENT: PEERLA, EOMI, Neck: supple, no JVD Chest: CTA bilaterally, no wheezes, crackles, or other adventitious sounds CV: RRR, no m/g/c/r Abd: soft, NT, ND, +BS in all four quadrants; no HSM, guarding, ridigity, or rebound tenderness Ext: no edema, well perfused with 2+ pulses, Skin:  no rash or lesions noted on observed skin Lymph: no noted LAD  Data Reviewed:  03/2020-BMP normal.  07/2019--CMP w/ GFR 45, Cr 1.54, TSH normal.   IMPRESSION: 2019 barium swallow 1. 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 2. No evidence of stricture or other anatomic cause for patient's Symptoms.   Assessment/Plan: Mr. Istre is a 69 y.o. male seen for follow-up of GERD.  1.  GERD-he is taking Protonix 40 mg 1-2 times a day.  We will refill his PPI for twice a day if needed but encouraged him to try once a day dosing with as needed Pepcid for breakthrough symptoms.  Diet modifications reviewed.  He has no upper GI alarm symptoms.  To notify me with any worsening symptoms  2.  History of colon polyps-last 2015 with small tubular adenoma.  Per Dr. Laural Golden 10-year repeat, patient to contact us with any lower GI symptoms sooner.   Nevaeh was seen today for follow-up.  Diagnoses and all orders for this visit:  Gastroesophageal reflux disease without esophagitis -     pantoprazole (PROTONIX) 40 MG tablet; Take 1 tablet (40 mg total) by mouth 2 (two) times daily before a meal.      I personally performed the service, non-incident to. (WP)  Laurine Blazer, [facility]

## 2020-06-30 ENCOUNTER — Other Ambulatory Visit: Payer: Self-pay

## 2020-06-30 DIAGNOSIS — I25110 Atherosclerotic heart disease of native coronary artery with unstable angina pectoris: Secondary | ICD-10-CM

## 2020-06-30 MED ORDER — NITROGLYCERIN 0.4 MG SL SUBL: 0.4000 mg | SUBLINGUAL_TABLET | SUBLINGUAL | 3 refills | Status: DC | PRN

## 2020-07-09 ENCOUNTER — Other Ambulatory Visit: Payer: Self-pay

## 2020-07-09 ENCOUNTER — Encounter: Payer: Self-pay | Admitting: Emergency Medicine

## 2020-07-09 ENCOUNTER — Ambulatory Visit (INDEPENDENT_AMBULATORY_CARE_PROVIDER_SITE_OTHER): Payer: No Typology Code available for payment source | Admitting: Emergency Medicine

## 2020-07-09 ENCOUNTER — Ambulatory Visit (INDEPENDENT_AMBULATORY_CARE_PROVIDER_SITE_OTHER): Payer: Medicare Other

## 2020-07-09 DIAGNOSIS — J9811 Atelectasis: Secondary | ICD-10-CM | POA: Diagnosis not present

## 2020-07-09 DIAGNOSIS — Z77090 Contact with and (suspected) exposure to asbestos: Secondary | ICD-10-CM | POA: Diagnosis not present

## 2020-07-09 DIAGNOSIS — J4520 Mild intermittent asthma, uncomplicated: Secondary | ICD-10-CM | POA: Diagnosis not present

## 2020-07-09 NOTE — Addendum Note (Signed)
Addended by: Gavin Potters R on: 07/09/2020 05:23 PM   Modules accepted: Orders

## 2020-07-09 NOTE — Patient Instructions (Signed)
Your chest x-ray today is stable. We will plan to repeat your pulmonary function testing and your CT scan of the chest in September 2022 to follow for any changes associated with asbestos exposure. Please keep albuterol available to use 2 puffs if needed for shortness of breath, chest tightness, wheezing.  We will refill this for you today. Follow with [name] in 12 months or sooner if you have any problems.

## 2020-07-09 NOTE — Assessment & Plan Note (Signed)
Clinically stable.  His chest x-ray today appears stable as well.  He will be due for repeat CT chest and repeat pulmonary function test in September 2021.  If he has any difficulty, changes clinically then we will do these early.

## 2020-07-09 NOTE — Progress Notes (Signed)
HPI:  ROV 07/05/18 --[name] is 72 with a history of former tobacco use and documented asbestos exposure when he worked for [employer].  Some pleural scarring suspected on chest imaging but we have been following stable CT scans of the chest and chest x-rays.  Mild obstruction, FEV1 2.83 L (73% predicted) on most recent PFT 06/14/18.  He has never really responded or benefited from albuterol, still has it available. He is having some increased nasal gtt over the last couple weeks. He is using allegra, just started flonase yesterday.   Repeat spirometry today shows FVC 3.7 L (72% predicted), FEV1 3.0 L (79% predicted), ratio 82%   Since last visit he has been worked up for a cardia myopathy and was found to have coronary disease, required cath by Dr. Einar Gip and then again in June by Dr Martinique for [facility]. He has more energy, his breathing is better. He remains on lasix.  ROV 07/10/2019 --this follow-up visit for 69 year old gentleman, former smoker with documented asbestos exposure (previously worked for [employer]).  He has mild obstruction on pulmonary function testing.  He repeated this today and I have reviewed.  This shows moderate obstruction FEV1 2.67 L (71% predicted) positive bronchodilator response to 3.13 L (83% predicted).  Overall stable compared with 2 years ago.  Lung volumes are normal.  Diffusion capacity is decreased and does not correct for alveolar volume, again stable compared with 2 years ago.  Repeat CT scan of the chest was done on 07/03/2019 and I have reviewed, shows scattered subpleural nodular disease all stable since 2018, no evidence of asbestos-related pleural disease or ILD. He has albuterol available to use, has not required since last time. He remains active, walks frequently. He has occasional cough related to allergies. He is on allegra, flonase prn. Is on PPI bid.   ROV 07/09/20 --Franklin Jones is 89, has a history of asbestos exposure when he worked for [employer].  He is a former  smoker with moderate obstruction on pulmonary function testing.  Overall doing well.  Denies any new problems, new dyspnea or cough.  His COVID-19 vaccine is up-to-date.  Chest x-ray done today reviewed by me, shows stable interstitial changes.  Some punctate calcified pulmonary nodules on the right.  I do not see any pleural plaquing.    Vitals:   07/09/20 1637  BP: 130/70  Pulse: 74  Temp: 99.2 F (37.3 C)  TempSrc: Temporal  SpO2: 97%  Weight: 229 lb (103.9 kg)  Height: 6\' 2"  (1.88 m)   Gen: Pleasant, well-nourished, in no distress,  normal affect  ENT: No lesions,  mouth clear,  oropharynx clear, no postnasal drip  Neck: No JVD, no stridor  Lungs: No use of accessory muscles, clear B   Cardiovascular: RRR, heart sounds normal, no murmur or gallops, no peripheral edema  Musculoskeletal: No deformities, no cyanosis or clubbing  Neuro: alert, non focal  Skin: Warm, no lesions or rashes  PULMONARY FUNCTON TEST 04/09/2010 04/28/2011 05/04/2012 06/06/2013  FVC 4.98 4.51 4.42 4.27  FEV1 3.68 3.2 3.24 3.1  FEV1/FVC 73.9 71 73.3 72.6  FVC  % Predicted 92 87 85 80  FEV % Predicted 90 88 90 78  FeF 25-75 2.66 2.01 2.32 4.09  FeF 25-75 % Predicted 3.32 3.29 3.25 5.04     ASSESSMENT/PLAN:  Asbestos exposure Clinically stable.  His chest x-ray today appears stable as well.  He will be due for repeat CT chest and repeat pulmonary function test in September  2021.  If he has any difficulty, changes clinically then we will do these early.  Asthma Has not have any limitations or symptoms but he does have moderate obstruction by pulmonary function testing.  I have asked him to keep albuterol available to use if needed.  Baltazar Apo, MD, PhD 07/09/2020, 5:14 PM [facility] [phone] or if no answer [phone]

## 2020-07-09 NOTE — Assessment & Plan Note (Signed)
Has not have any limitations or symptoms but he does have moderate obstruction by pulmonary function testing.  I have asked him to keep albuterol available to use if needed.

## 2020-08-06 ENCOUNTER — Other Ambulatory Visit: Payer: Self-pay | Admitting: Emergency Medicine

## 2020-08-06 ENCOUNTER — Other Ambulatory Visit (INDEPENDENT_AMBULATORY_CARE_PROVIDER_SITE_OTHER): Payer: Self-pay | Admitting: Gastroenterology

## 2020-08-06 DIAGNOSIS — K219 Gastro-esophageal reflux disease without esophagitis: Secondary | ICD-10-CM

## 2020-08-14 DIAGNOSIS — Z125 Encounter for screening for malignant neoplasm of prostate: Secondary | ICD-10-CM | POA: Diagnosis not present

## 2020-08-14 DIAGNOSIS — R5383 Other fatigue: Secondary | ICD-10-CM | POA: Diagnosis not present

## 2020-08-14 DIAGNOSIS — Z1331 Encounter for screening for depression: Secondary | ICD-10-CM | POA: Diagnosis not present

## 2020-08-14 DIAGNOSIS — Z23 Encounter for immunization: Secondary | ICD-10-CM | POA: Diagnosis not present

## 2020-08-14 DIAGNOSIS — Z79899 Other long term (current) drug therapy: Secondary | ICD-10-CM | POA: Diagnosis not present

## 2020-08-14 DIAGNOSIS — Z299 Encounter for prophylactic measures, unspecified: Secondary | ICD-10-CM | POA: Diagnosis not present

## 2020-08-14 DIAGNOSIS — I10 Essential (primary) hypertension: Secondary | ICD-10-CM | POA: Diagnosis not present

## 2020-08-14 DIAGNOSIS — E7800 Pure hypercholesterolemia, unspecified: Secondary | ICD-10-CM | POA: Diagnosis not present

## 2020-08-14 DIAGNOSIS — Z7189 Other specified counseling: Secondary | ICD-10-CM | POA: Diagnosis not present

## 2020-08-14 DIAGNOSIS — Z0000 Encounter for general adult medical examination without abnormal findings: Secondary | ICD-10-CM | POA: Diagnosis not present

## 2020-08-14 DIAGNOSIS — Z1339 Encounter for screening examination for other mental health and behavioral disorders: Secondary | ICD-10-CM | POA: Diagnosis not present

## 2020-08-14 DIAGNOSIS — Z87891 Personal history of nicotine dependence: Secondary | ICD-10-CM | POA: Diagnosis not present

## 2020-08-14 DIAGNOSIS — Z6831 Body mass index (BMI) 31.0-31.9, adult: Secondary | ICD-10-CM | POA: Diagnosis not present

## 2020-08-25 DIAGNOSIS — Z23 Encounter for immunization: Secondary | ICD-10-CM | POA: Diagnosis not present

## 2020-09-23 ENCOUNTER — Other Ambulatory Visit: Payer: Self-pay

## 2020-09-23 MED ORDER — CARVEDILOL 12.5 MG PO TABS
12.5000 mg | ORAL_TABLET | Freq: Two times a day (BID) | ORAL | 1 refills | Status: DC
Start: 2020-09-23 — End: 2021-03-19

## 2020-11-28 ENCOUNTER — Other Ambulatory Visit: Payer: Self-pay

## 2020-11-28 DIAGNOSIS — I25110 Atherosclerotic heart disease of native coronary artery with unstable angina pectoris: Secondary | ICD-10-CM

## 2020-11-28 MED ORDER — ROSUVASTATIN CALCIUM 10 MG PO TABS: 10.0000 mg | ORAL_TABLET | Freq: Every day | ORAL | 1 refills | Status: DC

## 2021-03-04 ENCOUNTER — Other Ambulatory Visit: Payer: Self-pay | Admitting: Cardiology

## 2021-03-04 DIAGNOSIS — I5032 Chronic diastolic (congestive) heart failure: Secondary | ICD-10-CM

## 2021-03-04 DIAGNOSIS — I25110 Atherosclerotic heart disease of native coronary artery with unstable angina pectoris: Secondary | ICD-10-CM

## 2021-03-18 ENCOUNTER — Other Ambulatory Visit: Payer: Self-pay | Admitting: Cardiology

## 2021-03-19 ENCOUNTER — Encounter: Payer: Self-pay | Admitting: Cardiology

## 2021-03-19 ENCOUNTER — Other Ambulatory Visit: Payer: Self-pay

## 2021-03-19 ENCOUNTER — Ambulatory Visit: Payer: Medicare Other | Admitting: Cardiology

## 2021-03-19 VITALS — BP 136/70 | HR 52 | Temp 97.8°F | Resp 16 | Ht 74.0 in | Wt 224.0 lb

## 2021-03-19 DIAGNOSIS — I25118 Atherosclerotic heart disease of native coronary artery with other forms of angina pectoris: Secondary | ICD-10-CM | POA: Diagnosis not present

## 2021-03-19 DIAGNOSIS — E7800 Pure hypercholesterolemia, unspecified: Secondary | ICD-10-CM | POA: Diagnosis not present

## 2021-03-19 DIAGNOSIS — I5032 Chronic diastolic (congestive) heart failure: Secondary | ICD-10-CM

## 2021-03-19 DIAGNOSIS — I5043 Acute on chronic combined systolic (congestive) and diastolic (congestive) heart failure: Secondary | ICD-10-CM

## 2021-03-19 DIAGNOSIS — N1831 Chronic kidney disease, stage 3a: Secondary | ICD-10-CM | POA: Diagnosis not present

## 2021-03-19 DIAGNOSIS — I25110 Atherosclerotic heart disease of native coronary artery with unstable angina pectoris: Secondary | ICD-10-CM | POA: Diagnosis not present

## 2021-03-19 NOTE — Progress Notes (Signed)
Primary Physician/Referring:  Glenda Chroman, MD  Patient ID: Franklin Jones, male    DOB: [birth year], 70 y.o.   MRN: [id number]  Chief Complaint  Patient presents with  . Coronary Artery Disease  . Congestive Heart Failure  . Follow-up    1 year   HPI:    Franklin Jones  is a 70 y.o. Caucasian male  with hypertension, stage III chronic kidney disease, hyperlipidemia, coronary artery disease, complete occlusion of proximal LAD to mid LAD and acute marginal of RCA by coronary angiography on 12/28/2017 at which time his EF was 20-25% and initially presented with florid CHF. He is S/P Mid LAD PCI in June 2019 with improvement in EF to 45%.   Due to symptoms of angina pectoris in the form of burning sensation in the chest with exertional activities, symptoms are remained stable.  He has mild dyspnea on exertion and this is also remained stable.  He has noticed occasional episodes of palpitations especially after he exercises and last for 5 to 10 minutes.  No other associated symptoms, symptoms resolved with activity.    Past Medical History:  Diagnosis Date  . Acute on chronic combined systolic and diastolic CHF (congestive heart failure) ([place]) 12/15/2017  . Acute on chronic heart failure ([place]) 12/15/2017  . Allergic rhinitis   . Arthritis    "qwhere" (6//19/2019)  . Asbestosis(501)    followed by Dr Barkley Boards- "on monitoring only; stable x years" (04/05/2018)  . Basal cell carcinoma    "face; head" (04/05/2018)  . Degenerative arthritis of knee 11/19/2011  . Dyspnea on exertion 02/01/2019  . GERD (gastroesophageal reflux disease)   . High cholesterol   . History of asbestos exposure    followed by Dr West Carbo- states stable years- last CT chest 7/12 in Epic  . Hypertension   . LBBB (left bundle branch block) 01/31/2018  . Shortness of breath    SOME SOB BUT CAN CLIMB FLIGHT OF STAIRS  . Squamous carcinoma    "back of neck" (04/05/2018)   Past Surgical History:  Procedure Laterality  Date  . CATARACT EXTRACTION, BILATERAL Bilateral 2020  . COLONOSCOPY    . COLONOSCOPY N/A 08/22/2014   Procedure: COLONOSCOPY;  Surgeon: Rogene Houston, MD;  Location: [facility];  Service: Endoscopy;  Laterality: N/A;  830  . CORONARY ANGIOPLASTY WITH STENT PLACEMENT  04/05/2018   CTO PCI of the mid LAD with DES x 2.  . CORONARY CTO INTERVENTION N/A 04/05/2018   Procedure: CORONARY CTO INTERVENTION;  Surgeon: Martinique, Peter M, MD;  Location: [facility];  Service: Cardiovascular;  Laterality: N/A;  . FINGER AMPUTATION Left 2009   traumatic injury left small finger with amputation  . JOINT REPLACEMENT    . KNEE ARTHROPLASTY  11/19/2011   Procedure: COMPUTER ASSISTED TOTAL KNEE ARTHROPLASTY;  Surgeon: Mcarthur Rossetti, MD;  Location: [facility];  Service: Orthopedics;  Laterality: Right;  Right Total Knee Arthroplasty  . MASS EXCISION Left 03/12/2014   Procedure: EXCISION LEFT NECK SKIN MASS;  Surgeon: Gayland Curry, MD;  Location: [place];  Service: General;  Laterality: Left;  . RIGHT/LEFT HEART CATH AND CORONARY ANGIOGRAPHY N/A 12/27/2017   Procedure: RIGHT/LEFT HEART CATH AND CORONARY ANGIOGRAPHY;  Surgeon: Adrian Prows, MD;  Location: [facility];  Service: Cardiovascular;  Laterality: N/A;  . TOTAL KNEE ARTHROPLASTY  08/18/2012   Procedure: TOTAL KNEE ARTHROPLASTY;  Surgeon: Mcarthur Rossetti, MD;  Location: [facility];  Service: Orthopedics;  Laterality: Left;  Left Total Knee Arthroplasty  . ULTRASOUND GUIDANCE FOR VASCULAR ACCESS  04/05/2018   Procedure: Ultrasound Guidance For Vascular Access;  Surgeon: Martinique, Peter M, MD;  Location: [facility];  Service: Cardiovascular;;   Family History  Problem Relation Age of Onset  . Allergies Mother   . Heart disease Father   . Lung cancer Brother   . Heart failure Brother    Social History   Tobacco Use  . Smoking status: Former Smoker    Packs/day: 1.00    Years: 20.00    Pack years: 20.00     Types: Cigarettes    Quit date: 10/18/1985    Years since quitting: 35.4  . Smokeless tobacco: Never Used  Substance Use Topics  . Alcohol use: Yes    Alcohol/week: 9.0 standard drinks    Types: 3 Cans of beer, 6 Standard drinks or equivalent per week    Comment: weekly   Marital Status: Married  ROS  Review of Systems  Cardiovascular: Positive for dyspnea on exertion (stable) and palpitations. Negative for chest pain and leg swelling.  Musculoskeletal: Positive for muscle cramps (occasional at night).  Gastrointestinal: Positive for heartburn. Negative for melena.  All other systems reviewed and are negative.  Objective   Vitals with BMI 03/19/2021 03/19/2021 07/09/2020  Height - 6\' 2"  6\' 2"   Weight - 224 lbs 229 lbs  BMI - 74.25 95.63  Systolic 875 643 329  Diastolic 70 94 70  Pulse 52 58 74    Blood pressure 136/70, pulse (!) 52, temperature 97.8 F (36.6 C), resp. rate 16, height 6\' 2"  (1.88 m), weight 224 lb (101.6 kg), SpO2 98 %. Body mass index is 28.76 kg/m.   Vitals with BMI 03/19/2021 03/19/2021 07/09/2020  Height - 6\' 2"  6\' 2"   Weight - 224 lbs 229 lbs  BMI - 51.88 41.66  Systolic 063 016 010  Diastolic 70 94 70  Pulse 52 58 74     Physical Exam HENT:     Head: Atraumatic.  Neck:     Vascular: No carotid bruit or JVD.  Cardiovascular:     Rate and Rhythm: Normal rate and regular rhythm.     Pulses: Normal pulses and intact distal pulses.     Heart sounds: Normal heart sounds. No murmur heard. No gallop.   Pulmonary:     Effort: Pulmonary effort is normal.     Breath sounds: Normal breath sounds.  Abdominal:     General: Bowel sounds are normal.     Palpations: Abdomen is soft.  Musculoskeletal:        General: No swelling.  Skin:    General: Skin is warm.    Laboratory examination:   Recent Labs    04/03/20 0848  NA 140  K 4.4  CL 106  CO2 22  GLUCOSE 107*  BUN 21  CREATININE 1.53*  CALCIUM 9.3  GFRNONAA 46*  GFRAA 53*   CMP Latest Ref  Rng & Units 04/03/2020 08/16/2019 01/25/2019  Glucose 65 - 99 mg/dL 107(H) 107(H) 97  BUN 8 - 27 mg/dL 21 25 19   Creatinine 0.76 - 1.27 mg/dL 1.53(H) 1.54(H) 1.77(H)  Sodium 134 - 144 mmol/L 140 140 141  Potassium 3.5 - 5.2 mmol/L 4.4 4.9 5.1  Chloride 96 - 106 mmol/L 106 105 103  CO2 20 - 29 mmol/L 22 24 23   Calcium 8.6 - 10.2 mg/dL 9.3 9.7 9.6  Total Protein 6.0 -  8.5 g/dL - 7.1 7.3  Total Bilirubin 0.0 - 1.2 mg/dL - 0.5 0.5  Alkaline Phos 39 - 117 IU/L - 93 89  AST 0 - 40 IU/L - 16 22  ALT 0 - 44 IU/L - 13 14   CBC Latest Ref Rng & Units 01/25/2019 04/06/2018 03/29/2018  WBC 3.4 - 10.8 x10E3/uL 6.1 7.0 7.8  Hemoglobin 13.0 - 17.7 g/dL 14.2 11.7(L) 12.9(L)  Hematocrit 37.5 - 51.0 % 41.8 36.5(L) 39.0  Platelets 150 - 450 x10E3/uL 188 151 214    Lipid Panel     Component Value Date/Time   CHOL 106 08/16/2019 0850   TRIG 78 08/16/2019 0850   HDL 43 08/16/2019 0850   LDLCALC 47 08/16/2019 0850   LDLDIRECT 55 08/16/2019 0850   HEMOGLOBIN A1C No results found for: HGBA1C, MPG TSH No results for input(s): TSH in the last 8760 hours.   External labs    Cholesterol, total 121.000 M 08/14/2020 HDL 41.000 MG 08/14/2020 LDL 55.000 MG 08/16/2019 Triglycerides 82.000 MG 08/14/2020  Hemoglobin 14.600 G/ 08/14/2020 Platelets 212.000 X1 08/14/2020  Creatinine, Serum 1.480 MG/ 08/14/2020 Potassium 4.400 mm 04/03/2020 ALT (SGPT) 16.000 IU/ 08/14/2020  TSH 3.850 08/14/2020  Medications and allergies   Allergies  Allergen Reactions  . Penicillins Rash    Has patient had a PCN reaction causing immediate rash, facial/tongue/throat swelling, SOB or lightheadedness with hypotension: unkn Has patient had a PCN reaction causing severe rash involving mucus membranes or skin necrosis: unkn Has patient had a PCN reaction that required hospitalization: no Has patient had a PCN reaction occurring within the last 10 years: no If all of the above answers are "NO", then may proceed with  Cephalosporin use.      Radiology:  No results found.  Cardiac Studies:   Coronary angiogram 04/08/2018 Successful CTO PCI of the mid LAD with DES x 2. (Synergy 2.5 x 24 and 2.25 x 38 mm DES. Medical therapy for CTO RCA, flow improved since prior angiography on 12/28/2017. Diffusely diseased RCA Mild disease Cx.  Echocardiogram 11/14/2019:  Left ventricle cavity is minimally dilated at 5.4 cm. Moderate LVH. Mildly depressed LV systolic function with EF 45%. Doppler evidence of grade I (impaired) diastolic dysfunction, elevated LAP.  Left ventricle  regional wall motion findings: Basal anteroseptal, Basal inferoseptal, Mid anteroseptal, Mid inferoseptal, Apical anterior and Apical septal mild  hypokinesis.  Calculated EF 45%.  Trileaflet aortic valve with no regurgitation noted. Mild aortic valve  leaflet thickening.  Grossly normal pulmonic valve. Mild to moderate pulmonic regurgitation.  No significant change from 07/18/2018.  Compared tp previous study on 03/22/2018, LVEF has significantly improved from 20%.  Lexiscan (Walking with mod Bruce)Tetrofosmin Stress Test  12/17/2019: Non-diagnostic ECG stress. Myocardial perfusion is abnormal. There is a small sized fixed moderate defect in the distal anterior and apical regions.  Overall LV systolic function is  mildly abnormal with distal anterior and apical hypokinesis. Stress LV EF: 44%.  No previous exam available for comparison. Intermediate risk study.   EKG   EKG 03/19/2021: Sinus bradycardia at rate of 52 bpm with first-degree AV block, left atrial enlargement, no evidence of ischemia.  Otherwise normal EKG.  No significant change from 11/29/2019, first-degree AV block   Assessment     ICD-10-CM   1. Coronary artery disease of native artery of native heart with stable angina pectoris ([place])  I25.118 EKG 12-Lead  2. Chronic diastolic CHF (congestive heart failure) ([facility])  I50.32   3. Hypercholesteremia  E78.00  4. Chronic renal  failure, stage 3a ([facility])  N18.31   5. Acute on chronic combined systolic and diastolic CHF (congestive heart failure) ([facility])  I50.43      Outpatient Encounter Medications as of 03/19/2021  Medication Sig  . aspirin EC 81 MG tablet Take 1 tablet (81 mg total) by mouth daily.  [name] Kitchen BIDIL 20-37.5 MG tablet TAKE 1 TABLET BY MOUTH THREE TIMES A DAY  . fexofenadine (ALLEGRA) 180 MG tablet Take 180 mg by mouth daily.  [name] Kitchen losartan (COZAAR) 25 MG tablet TAKE 1 TABLET BY MOUTH EVERY MORNING.  . nitroGLYCERIN (NITROSTAT) 0.4 MG SL tablet Place 1 tablet (0.4 mg total) under the tongue every 5 (five) minutes as needed for chest pain.  . pantoprazole (PROTONIX) 40 MG tablet TAKE (1) TABLET TWICE A DAY BEFORE MEALS.  . rosuvastatin (CRESTOR) 10 MG tablet TAKE 1 TABLET ONCE DAILY.  . sildenafil (REVATIO) 20 MG tablet Take 20 mg by mouth as needed.  . VENTOLIN HFA 108 (90 Base) MCG/ACT inhaler INHALE 2 PUFFS EVERY 4 HOURS AS NEEDED FOR WHEEZING OR SHORTNESS OF BREATH.  . [DISCONTINUED] carvedilol (COREG) 12.5 MG tablet Take 1 tablet (12.5 mg total) by mouth 2 (two) times daily.   No facility-administered encounter medications on file as of 03/19/2021.   Recommendations:   EJ PINSON  is a 70 y.o. Caucasian male  with hypertension, stage III chronic kidney disease, hyperlipidemia, coronary artery disease, complete occlusion of proximal LAD to mid LAD and acute marginal of RCA by coronary angiography on 12/28/2017 at which time his EF was 20-25% and initially presented with florid CHF. He is S/P Mid LAD PCI in June 2019 with improvement in EF to 45%.   Patient is presently doing well and has not had any recurrence of angina pectoris.  His lipids are under excellent control.  His hypertension is also well controlled.  He is tolerating low-dose of losartan, could not tolerate higher dose due to renal insufficiency.  Serum creatinine is remained stable.  Otherwise from cardiac standpoint he is doing well, I will see  him back on annual basis.    Adrian Prows, MD, [facility] : [facility] 03/19/2021, 9:31 PM Office: [phone] Fax: [phone] Pager: [phone]

## 2021-03-25 DIAGNOSIS — J61 Pneumoconiosis due to asbestos and other mineral fibers: Secondary | ICD-10-CM | POA: Diagnosis not present

## 2021-03-25 DIAGNOSIS — I25119 Atherosclerotic heart disease of native coronary artery with unspecified angina pectoris: Secondary | ICD-10-CM | POA: Diagnosis not present

## 2021-03-25 DIAGNOSIS — Z299 Encounter for prophylactic measures, unspecified: Secondary | ICD-10-CM | POA: Diagnosis not present

## 2021-03-25 DIAGNOSIS — I509 Heart failure, unspecified: Secondary | ICD-10-CM | POA: Diagnosis not present

## 2021-03-25 DIAGNOSIS — N183 Chronic kidney disease, stage 3 unspecified: Secondary | ICD-10-CM | POA: Diagnosis not present

## 2021-05-26 ENCOUNTER — Other Ambulatory Visit: Payer: Self-pay

## 2021-05-26 MED ORDER — BIDIL 20-37.5 MG PO TABS
1.0000 | ORAL_TABLET | Freq: Three times a day (TID) | ORAL | 3 refills | Status: DC
Start: 2021-05-26 — End: 2022-03-25

## 2021-06-03 ENCOUNTER — Other Ambulatory Visit: Payer: Self-pay | Admitting: Cardiology

## 2021-06-03 DIAGNOSIS — I5032 Chronic diastolic (congestive) heart failure: Secondary | ICD-10-CM

## 2021-06-11 ENCOUNTER — Ambulatory Visit (INDEPENDENT_AMBULATORY_CARE_PROVIDER_SITE_OTHER): Payer: Medicare Other | Admitting: Gastroenterology

## 2021-06-17 ENCOUNTER — Other Ambulatory Visit: Payer: Self-pay | Admitting: Cardiology

## 2021-07-03 ENCOUNTER — Other Ambulatory Visit: Payer: Self-pay

## 2021-07-03 ENCOUNTER — Ambulatory Visit
Admission: RE | Admit: 2021-07-03 | Discharge: 2021-07-03 | Disposition: A | Discharge status: Home / Self Care | Payer: No Typology Code available for payment source | Source: Ambulatory Visit | Attending: Emergency Medicine | Admitting: Emergency Medicine

## 2021-07-03 DIAGNOSIS — Z77090 Contact with and (suspected) exposure to asbestos: Secondary | ICD-10-CM

## 2021-07-13 ENCOUNTER — Other Ambulatory Visit: Payer: Self-pay | Admitting: *Deleted

## 2021-07-13 DIAGNOSIS — Z77090 Contact with and (suspected) exposure to asbestos: Secondary | ICD-10-CM

## 2021-07-14 ENCOUNTER — Other Ambulatory Visit: Payer: Self-pay

## 2021-07-14 ENCOUNTER — Ambulatory Visit (INDEPENDENT_AMBULATORY_CARE_PROVIDER_SITE_OTHER): Payer: No Typology Code available for payment source | Admitting: Emergency Medicine

## 2021-07-14 ENCOUNTER — Encounter: Payer: Self-pay | Admitting: Emergency Medicine

## 2021-07-14 DIAGNOSIS — J4520 Mild intermittent asthma, uncomplicated: Secondary | ICD-10-CM

## 2021-07-14 DIAGNOSIS — Z77090 Contact with and (suspected) exposure to asbestos: Secondary | ICD-10-CM | POA: Diagnosis not present

## 2021-07-14 LAB — PULMONARY FUNCTION TEST
DL/VA % pred: 59 %
DL/VA: 2.37 ml/min/mmHg/L
DLCO cor % pred: 62 %
DLCO cor: 18.02 ml/min/mmHg
DLCO unc % pred: 62 %
DLCO unc: 18.02 ml/min/mmHg
FEF 25-75 Post: 2.68 L/sec
FEF 25-75 Pre: 2.03 L/sec
FEF2575-%Change-Post: 31 %
FEF2575-%Pred-Post: 97 %
FEF2575-%Pred-Pre: 73 %
FEV1-%Change-Post: 7 %
FEV1-%Pred-Post: 89 %
FEV1-%Pred-Pre: 83 %
FEV1-Post: 3.3 L
FEV1-Pre: 3.07 L
FEV1FVC-%Change-Post: 0 %
FEV1FVC-%Pred-Pre: 96 %
FEV6-%Change-Post: 7 %
FEV6-%Pred-Post: 97 %
FEV6-%Pred-Pre: 91 %
FEV6-Post: 4.63 L
FEV6-Pre: 4.32 L
FEV6FVC-%Pred-Post: 105 %
FEV6FVC-%Pred-Pre: 105 %
FVC-%Change-Post: 7 %
FVC-%Pred-Post: 92 %
FVC-%Pred-Pre: 86 %
FVC-Post: 4.63 L
FVC-Pre: 4.32 L
Post FEV1/FVC ratio: 71 %
Post FEV6/FVC ratio: 100 %
Pre FEV1/FVC ratio: 71 %
Pre FEV6/FVC Ratio: 100 %
RV % pred: 131 %
RV: 3.5 L
TLC % pred: 107 %
TLC: 8.35 L

## 2021-07-14 NOTE — Progress Notes (Signed)
HPI:  ROV 07/14/21 --visit for pleasant 70 year old gentleman with a history of occupational asbestos exposure.  Also a former smoker.  He has moderate obstructive lung disease on pulmonary function testing.  We repeated his CT scan of the chest this month as below, pulmonary function testing today.  He reports that he is doing well - good functional capacity. He exercises frequently.  He has albuterol that he can use as needed, is not on any scheduled bronchodilators.  Has used it rarely - did help him last year when he had a URI. He had COVID in June 2022, URI sx, took anti-virals. Took months for him to feel back to normal.  He is on Allegra, Protonix  Pulmonary function testing performed today and reviewed by me shows mild obstruction without a bronchodilator response, normal lung volumes and a decreased diffusion capacity.  His FEV1 is improved compared with September 2020 and August 2018.  Total lung capacity is also improved.  CT scan of the chest performed 07/03/2021 reviewed by me showed no evidence of pleural disease or calcifications, plaques.  There are some very subtle septal thickening and peripheral groundglass change without any honeycomb.  Unchanged pulmonary nodules with some calcification.    Vitals:   07/14/21 1629  BP: 122/74  Pulse: 64  Temp: (!) 97.1 F (36.2 C)  TempSrc: Oral  SpO2: 95%  Weight: 225 lb (102.1 kg)  Height: 6\' 2"  (1.88 m)   Gen: Pleasant, well-nourished, in no distress,  normal affect  ENT: No lesions,  mouth clear,  oropharynx clear, no postnasal drip  Neck: No JVD, no stridor  Lungs: No use of accessory muscles, clear B   Cardiovascular: RRR, heart sounds normal, no murmur or gallops, no peripheral edema  Musculoskeletal: No deformities, no cyanosis or clubbing  Neuro: alert, non focal  Skin: Warm, no lesions or rashes  PULMONARY FUNCTON TEST 04/09/2010 04/28/2011 05/04/2012 06/06/2013  FVC 4.98 4.51 4.42 4.27  FEV1 3.68 3.2 3.24 3.1   FEV1/FVC 73.9 71 73.3 72.6  FVC  % Predicted 92 87 85 80  FEV % Predicted 90 88 90 78  FeF 25-75 2.66 2.01 2.32 4.09  FeF 25-75 % Predicted 3.32 3.29 3.25 5.04     ASSESSMENT/PLAN:  Asbestos exposure Pulmonary function testing stable, actually improved compared with 2 years ago, 4 years ago.  His CT scan of the chest does not show any significant change, possibly some very subtle peripheral septal thickening.  This could be related to asbestos, could be related to his COVID-19 in June.  Either way it does not look like he is involving any overt interstitial disease.  He needs a repeat chest x-ray in a year.  We can determine the timing of repeat PFT, CT chest at that time.  We will likely do this every other year.  Asthma Mild obstructive lung disease, not on the schedule bronchodilator therapy.  He does have albuterol that he can use as needed.  Great functional capacity.  No exacerbations.  Baltazar Apo, MD, PhD 07/14/2021, 5:09 PM [facility] [phone] or if no answer [phone]

## 2021-07-14 NOTE — Patient Instructions (Signed)
Full PFT performed today. °

## 2021-07-14 NOTE — Patient Instructions (Signed)
Reviewed your pulmonary function testing and your CT scan of the chest today.  Both are stable, look good compared with your priors. Agree with getting the COVID-19 booster this fall. You benefit from getting the flu shot this fall Keep your albuterol available to use 2 puffs if needed shortness of breath, chest tightness, wheezing. Follow [name] in 1 year.  We will repeat your chest x-ray at that time.

## 2021-07-14 NOTE — Progress Notes (Signed)
Full PFT performed today. °

## 2021-07-14 NOTE — Assessment & Plan Note (Signed)
Pulmonary function testing stable, actually improved compared with 2 years ago, 4 years ago.  His CT scan of the chest does not show any significant change, possibly some very subtle peripheral septal thickening.  This could be related to asbestos, could be related to his COVID-19 in June.  Either way it does not look like he is involving any overt interstitial disease.  He needs a repeat chest x-ray in a year.  We can determine the timing of repeat PFT, CT chest at that time.  We will likely do this every other year.

## 2021-07-14 NOTE — Assessment & Plan Note (Signed)
Mild obstructive lung disease, not on the schedule bronchodilator therapy.  He does have albuterol that he can use as needed.  Great functional capacity.  No exacerbations.

## 2021-07-27 DIAGNOSIS — Z23 Encounter for immunization: Secondary | ICD-10-CM | POA: Diagnosis not present

## 2021-08-21 DIAGNOSIS — Z7189 Other specified counseling: Secondary | ICD-10-CM | POA: Diagnosis not present

## 2021-08-21 DIAGNOSIS — Z1331 Encounter for screening for depression: Secondary | ICD-10-CM | POA: Diagnosis not present

## 2021-08-21 DIAGNOSIS — R5383 Other fatigue: Secondary | ICD-10-CM | POA: Diagnosis not present

## 2021-08-21 DIAGNOSIS — Z87891 Personal history of nicotine dependence: Secondary | ICD-10-CM | POA: Diagnosis not present

## 2021-08-21 DIAGNOSIS — Z0000 Encounter for general adult medical examination without abnormal findings: Secondary | ICD-10-CM | POA: Diagnosis not present

## 2021-08-21 DIAGNOSIS — Z6831 Body mass index (BMI) 31.0-31.9, adult: Secondary | ICD-10-CM | POA: Diagnosis not present

## 2021-08-21 DIAGNOSIS — Z1339 Encounter for screening examination for other mental health and behavioral disorders: Secondary | ICD-10-CM | POA: Diagnosis not present

## 2021-08-21 DIAGNOSIS — I10 Essential (primary) hypertension: Secondary | ICD-10-CM | POA: Diagnosis not present

## 2021-08-21 DIAGNOSIS — E7800 Pure hypercholesterolemia, unspecified: Secondary | ICD-10-CM | POA: Diagnosis not present

## 2021-08-21 DIAGNOSIS — K219 Gastro-esophageal reflux disease without esophagitis: Secondary | ICD-10-CM | POA: Diagnosis not present

## 2021-08-21 DIAGNOSIS — Z299 Encounter for prophylactic measures, unspecified: Secondary | ICD-10-CM | POA: Diagnosis not present

## 2021-08-25 DIAGNOSIS — Z79899 Other long term (current) drug therapy: Secondary | ICD-10-CM | POA: Diagnosis not present

## 2021-08-25 DIAGNOSIS — Z125 Encounter for screening for malignant neoplasm of prostate: Secondary | ICD-10-CM | POA: Diagnosis not present

## 2021-08-25 DIAGNOSIS — E7800 Pure hypercholesterolemia, unspecified: Secondary | ICD-10-CM | POA: Diagnosis not present

## 2021-08-25 DIAGNOSIS — R5383 Other fatigue: Secondary | ICD-10-CM | POA: Diagnosis not present

## 2021-09-02 ENCOUNTER — Other Ambulatory Visit: Payer: Self-pay | Admitting: Emergency Medicine

## 2021-09-02 ENCOUNTER — Other Ambulatory Visit: Payer: Self-pay | Admitting: Cardiology

## 2021-09-02 DIAGNOSIS — I25110 Atherosclerotic heart disease of native coronary artery with unstable angina pectoris: Secondary | ICD-10-CM

## 2021-09-16 ENCOUNTER — Other Ambulatory Visit: Payer: Self-pay | Admitting: Cardiology

## 2021-11-25 ENCOUNTER — Other Ambulatory Visit: Payer: Self-pay | Admitting: Cardiology

## 2021-11-25 DIAGNOSIS — I25110 Atherosclerotic heart disease of native coronary artery with unstable angina pectoris: Secondary | ICD-10-CM

## 2021-12-16 ENCOUNTER — Other Ambulatory Visit: Payer: Self-pay | Admitting: Cardiology

## 2022-02-17 ENCOUNTER — Other Ambulatory Visit: Payer: Self-pay | Admitting: Cardiology

## 2022-02-17 DIAGNOSIS — I25110 Atherosclerotic heart disease of native coronary artery with unstable angina pectoris: Secondary | ICD-10-CM

## 2022-02-24 ENCOUNTER — Other Ambulatory Visit: Payer: Self-pay | Admitting: Cardiology

## 2022-02-24 DIAGNOSIS — I25110 Atherosclerotic heart disease of native coronary artery with unstable angina pectoris: Secondary | ICD-10-CM

## 2022-03-02 ENCOUNTER — Telehealth: Payer: Self-pay | Admitting: Cardiology

## 2022-03-02 NOTE — Telephone Encounter (Signed)
Patient would like to know if he will need any labs to be completed prior to his follow up appointment. Informed patient there are no orders currently but will verify. ?

## 2022-03-02 NOTE — Telephone Encounter (Signed)
03/25/2022 2:30 PM .  If his PCP has not done recent labs, I be happy to place lab orders.  If his PCP has done the labs, please ask him to bring them for the appointment

## 2022-03-17 ENCOUNTER — Other Ambulatory Visit: Payer: Self-pay | Admitting: Cardiology

## 2022-03-25 ENCOUNTER — Ambulatory Visit: Payer: Medicare Other | Admitting: Cardiology

## 2022-03-25 ENCOUNTER — Encounter: Payer: Self-pay | Admitting: Cardiology

## 2022-03-25 VITALS — BP 115/68 | HR 65 | Temp 98.0°F | Resp 17 | Ht 74.0 in | Wt 225.8 lb

## 2022-03-25 DIAGNOSIS — I5032 Chronic diastolic (congestive) heart failure: Secondary | ICD-10-CM

## 2022-03-25 DIAGNOSIS — I25118 Atherosclerotic heart disease of native coronary artery with other forms of angina pectoris: Secondary | ICD-10-CM

## 2022-03-25 DIAGNOSIS — E7800 Pure hypercholesterolemia, unspecified: Secondary | ICD-10-CM | POA: Diagnosis not present

## 2022-03-25 DIAGNOSIS — I25110 Atherosclerotic heart disease of native coronary artery with unstable angina pectoris: Secondary | ICD-10-CM | POA: Diagnosis not present

## 2022-03-25 DIAGNOSIS — N1831 Chronic kidney disease, stage 3a: Secondary | ICD-10-CM

## 2022-03-25 NOTE — Progress Notes (Signed)
Primary Physician/Referring:  Glenda Chroman, MD  Patient ID: Franklin Jones, male    DOB: [birth year], 71 y.o.   MRN: [id number]  Chief Complaint  Patient presents with   Follow-up    1 YEAR   Coronary Artery Disease   HPI:    Franklin Jones  is a 71 y.o. Caucasian male  with hypertension, stage III chronic kidney disease, hyperlipidemia, coronary artery disease, complete occlusion of proximal LAD to mid LAD and acute marginal of RCA by coronary angiography on 12/28/2017 at which time his EF was 20-25% and initially presented with florid CHF. He is S/P Mid LAD PCI in June 2019 with improvement in EF to 45%.   His anginal symptoms of burning sensation of the chest with exertional activities.  He has not had any further episodes since angioplasty.  He is presently doing well except he has noticed episodes of diarrhea and also constipation.  His other complaint was marked dizziness when he suddenly stands up and he has noticed his blood pressure to be very low around 90 mmHg systolic.    Past Medical History:  Diagnosis Date   Acute on chronic combined systolic and diastolic CHF (congestive heart failure) ([place]) 12/15/2017   Acute on chronic heart failure ([place]) 12/15/2017   Allergic rhinitis    Arthritis    "qwhere" (6//19/2019)   Asbestosis(501)    followed by Dr Barkley Boards- "on monitoring only; stable x years" (04/05/2018)   Basal cell carcinoma    "face; head" (04/05/2018)   Degenerative arthritis of knee 11/19/2011   Dyspnea on exertion 02/01/2019   GERD (gastroesophageal reflux disease)    High cholesterol    History of asbestos exposure    followed by Dr West Carbo- states stable years- last CT chest 7/12 in Epic   Hypertension    LBBB (left bundle branch block) 01/31/2018   Shortness of breath    SOME SOB BUT CAN CLIMB FLIGHT OF STAIRS   Squamous carcinoma    "back of neck" (04/05/2018)   Past Surgical History:  Procedure Laterality Date   CATARACT EXTRACTION, BILATERAL Bilateral  2020   COLONOSCOPY     COLONOSCOPY N/A 08/22/2014   Procedure: COLONOSCOPY;  Surgeon: Rogene Houston, MD;  Location: [facility];  Service: Endoscopy;  Laterality: N/A;  49   CORONARY ANGIOPLASTY WITH STENT PLACEMENT  04/05/2018   CTO PCI of the mid LAD with DES x 2.   CORONARY CTO INTERVENTION N/A 04/05/2018   Procedure: CORONARY CTO INTERVENTION;  Surgeon: Martinique, Peter M, MD;  Location: [facility];  Service: Cardiovascular;  Laterality: N/A;   FINGER AMPUTATION Left 2009   traumatic injury left small finger with amputation   JOINT REPLACEMENT     KNEE ARTHROPLASTY  11/19/2011   Procedure: COMPUTER ASSISTED TOTAL KNEE ARTHROPLASTY;  Surgeon: Mcarthur Rossetti, MD;  Location: [facility];  Service: Orthopedics;  Laterality: Right;  Right Total Knee Arthroplasty   MASS EXCISION Left 03/12/2014   Procedure: EXCISION LEFT NECK SKIN MASS;  Surgeon: Gayland Curry, MD;  Location: [facility];  Service: General;  Laterality: Left;   RIGHT/LEFT HEART CATH AND CORONARY ANGIOGRAPHY N/A 12/27/2017   Procedure: RIGHT/LEFT HEART CATH AND CORONARY ANGIOGRAPHY;  Surgeon: Adrian Prows, MD;  Location: [facility];  Service: Cardiovascular;  Laterality: N/A;   TOTAL KNEE ARTHROPLASTY  08/18/2012   Procedure: TOTAL KNEE ARTHROPLASTY;  Surgeon: Mcarthur Rossetti, MD;  Location: [facility];  Service:  Orthopedics;  Laterality: Left;  Left Total Knee Arthroplasty   ULTRASOUND GUIDANCE FOR VASCULAR ACCESS  04/05/2018   Procedure: Ultrasound Guidance For Vascular Access;  Surgeon: Martinique, Peter M, MD;  Location: [facility];  Service: Cardiovascular;;   Family History  Problem Relation Age of Onset   Allergies Mother    Heart disease Father    Lung cancer Brother    Heart failure Brother    Social History   Tobacco Use   Smoking status: Former    Packs/day: 1.00    Years: 20.00    Total pack years: 20.00    Types: Cigarettes    Quit date: 10/18/1985    Years since quitting:  36.4   Smokeless tobacco: Never  Substance Use Topics   Alcohol use: Yes    Alcohol/week: 9.0 standard drinks of alcohol    Types: 3 Cans of beer, 6 Standard drinks or equivalent per week    Comment: OCC   Marital Status: Married  ROS  Review of Systems  Cardiovascular:  Positive for dyspnea on exertion (stable) and palpitations. Negative for chest pain and leg swelling.  Gastrointestinal:  Positive for constipation, diarrhea and heartburn (occasional). Negative for melena.  All other systems reviewed and are negative.  Objective      03/25/2022   11:48 AM 07/14/2021    4:29 PM 03/19/2021    9:13 AM  Vitals with BMI  Height 6' 2"  6' 2"    Weight 225 lbs 13 oz 225 lbs   BMI 51.02 58.52   Systolic 778 242 353  Diastolic 68 74 70  Pulse 65 64 52    Blood pressure 115/68, pulse 65, temperature 98 F (36.7 C), temperature source Temporal, resp. rate 17, height 6' 2"  (1.88 m), weight 225 lb 12.8 oz (102.4 kg), SpO2 95 %. Body mass index is 28.99 kg/m.      03/25/2022   11:48 AM 07/14/2021    4:29 PM 03/19/2021    9:13 AM  Vitals with BMI  Height 6' 2"  6' 2"    Weight 225 lbs 13 oz 225 lbs   BMI 61.44 31.54   Systolic 008 676 195  Diastolic 68 74 70  Pulse 65 64 52     Physical Exam HENT:     Head: Atraumatic.  Neck:     Vascular: No carotid bruit or JVD.  Cardiovascular:     Rate and Rhythm: Normal rate and regular rhythm.     Pulses: Normal pulses and intact distal pulses.     Heart sounds: Normal heart sounds. No murmur heard.    No gallop.  Pulmonary:     Effort: Pulmonary effort is normal.     Breath sounds: Normal breath sounds.  Abdominal:     General: Bowel sounds are normal.     Palpations: Abdomen is soft.  Musculoskeletal:        General: No swelling.  Skin:    General: Skin is warm.    Laboratory examination:   External labs   Cholesterol, total 116.000 M 08/25/2021 HDL 40.000 MG 08/25/2021 LDL 64.000 mg 08/14/2020 Triglycerides 93.000 MG  08/25/2021  Hemoglobin 15.500 G/ 08/25/2021 Platelets 195.000 X1 08/25/2021  Creatinine, Serum 1.390 MG/ 08/25/2021 ALT (SGPT) 16.000 IU/ 08/25/2021 eGFR 46  TSH 4.090 08/25/2021   Medications and allergies   Allergies  Allergen Reactions   Penicillins Rash    Has patient had a PCN reaction causing immediate rash, facial/tongue/throat swelling, SOB or lightheadedness with hypotension: unkn Has  patient had a PCN reaction causing severe rash involving mucus membranes or skin necrosis: unkn Has patient had a PCN reaction that required hospitalization: no Has patient had a PCN reaction occurring within the last 10 years: no If all of the above answers are "NO", then may proceed with Cephalosporin use.      Current Outpatient Medications:    aspirin EC 81 MG tablet, Take 1 tablet (81 mg total) by mouth daily., Disp: 90 tablet, Rfl: 3   carvedilol (COREG) 12.5 MG tablet, TAKE (1) TABLET TWICE DAILY., Disp: 180 tablet, Rfl: 0   fexofenadine (ALLEGRA) 180 MG tablet, Take 180 mg by mouth daily., Disp: , Rfl:    losartan (COZAAR) 25 MG tablet, TAKE 1 TABLET BY MOUTH EVERY MORNING., Disp: 90 tablet, Rfl: 3   nitroGLYCERIN (NITROSTAT) 0.4 MG SL tablet, PLACE (1) TABLET UNDER TONGUE EVERY 5 MINUTES UP TO (3) DOSES. IF NO RELIEF CALL 911., Disp: 25 tablet, Rfl: 0   pantoprazole (PROTONIX) 40 MG tablet, TAKE (1) TABLET TWICE A DAY BEFORE MEALS. (Patient taking differently: Take 40 mg by mouth daily.), Disp: 60 tablet, Rfl: 0   rosuvastatin (CRESTOR) 10 MG tablet, TAKE 1 TABLET ONCE DAILY., Disp: 90 tablet, Rfl: 0   sildenafil (VIAGRA) 100 MG tablet, Take by mouth daily., Disp: , Rfl:    VENTOLIN HFA 108 (90 Base) MCG/ACT inhaler, INHALE 2 PUFFS EVERY 4 HOURS AS NEEDED FOR WHEEZING OR SHORTNESS OF BREATH., Disp: 18 g, Rfl: 3   Radiology:   CT chest 07/03/2021 without contrast: 1. Cardiovascular: Heart size is normal. There is no significant pericardial fluid, thickening or pericardial calcification.  There is aortic atherosclerosis, as well as atherosclerosis of the great vessels of the mediastinum and the coronary arteries, including calcified atherosclerotic plaque in the left main, left anterior descending, left circumflex and right coronary arteries. 2. No calcified pleural plaques to suggest asbestos related pleural disease. Subtle changes in the lungs which could be indicative of early or mild interstitial lung disease.  Cardiac Studies:   Coronary angiogram 04/08/2018 Successful CTO PCI of the mid LAD with DES x 2. (Synergy 2.5 x 24 and 2.25 x 38 mm DES. Medical therapy for CTO RCA, flow improved since prior angiography on 12/28/2017. Diffusely diseased RCA Mild disease Cx.  Echocardiogram 11/14/2019:  Left ventricle cavity is minimally dilated at 5.4 cm. Moderate LVH. Mildly depressed LV systolic function with EF 45%. Doppler evidence of grade I (impaired) diastolic dysfunction, elevated LAP.  Left ventricle  regional wall motion findings: Basal anteroseptal, Basal inferoseptal, Mid anteroseptal, Mid inferoseptal, Apical anterior and Apical septal mild  hypokinesis.  Calculated EF 45%.  Trileaflet aortic valve with no regurgitation noted. Mild aortic valve  leaflet thickening.  Grossly normal pulmonic valve.  Mild to moderate pulmonic regurgitation.  No significant change from 07/18/2018.  Compared tp previous study on 03/22/2018, LVEF has significantly improved from 20%.  Lexiscan (Walking with mod Bruce)Tetrofosmin Stress Test  12/17/2019: Non-diagnostic ECG stress. Myocardial perfusion is abnormal. There is a small sized fixed moderate defect in the distal anterior and apical regions.  Overall LV systolic function is  mildly abnormal with distal anterior and apical hypokinesis. Stress LV EF: 44%.  No previous exam available for comparison. Intermediate risk study.   EKG  EKG 03/25/2022: Sinus bradycardia at the rate of 56 bpm with borderline first-degree block, PR interval 200  ms, normal axis, no evidence of ischemia, normal EKG.  No significant change from 03/19/2021.  Assessment  ICD-10-CM   1. Coronary artery disease of native artery of native heart with stable angina pectoris ([facility])  I25.118 EKG 12-Lead    2. Chronic diastolic CHF (congestive heart failure) (H[facility])  I50.32     3. Chronic renal failure, stage 3a ([facility])  N18.31     4. Hypercholesteremia  E78.00        Recommendations:   Franklin Jones  is a 71 y.o. Caucasian male  with hypertension, stage III chronic kidney disease, hyperlipidemia, coronary artery disease, complete occlusion of proximal LAD to mid LAD and acute marginal of RCA by coronary angiography on 12/28/2017 at which time his EF was 20-25% and initially presented with florid CHF. He is S/P Mid LAD PCI in June 2019 with improvement in EF to 45%.   Patient is presently doing well and has not had any recurrence of angina pectoris.  His main issue has been very low blood pressure, he has had near syncopal spells as well.  As his LVEF has been maintaining at around 45%, I will go ahead and discontinue BiDil 1 p.o. 3 times daily.  He will continue to monitor his blood pressure closely and is very compliant.  He is on low-dose of losartan 25 mg daily in view of chronic renal insufficiency.  Continue the same.  Renal function has remained stable.  His lipids are under excellent control.  His hypertension is also well controlled.  Otherwise stable from cardiac standpoint, I will see him back in 6 months for follow-up as I am discontinuing BiDil.  I reviewed his CT scan of the chest that is being performed for surveillance of questionable asbestosis, I see linear scarring at the bases, it could be early ILD.  He follows pulmonary medicine with [name].Adrian Prows, MD, [facility] 03/25/2022, 12:59 PM Office: [phone] Fax: [phone] Pager: [phone]

## 2022-03-31 ENCOUNTER — Other Ambulatory Visit: Payer: Self-pay | Admitting: Cardiology

## 2022-03-31 DIAGNOSIS — I5032 Chronic diastolic (congestive) heart failure: Secondary | ICD-10-CM

## 2022-05-27 ENCOUNTER — Other Ambulatory Visit: Payer: Self-pay | Admitting: Cardiology

## 2022-05-27 DIAGNOSIS — I25110 Atherosclerotic heart disease of native coronary artery with unstable angina pectoris: Secondary | ICD-10-CM

## 2022-06-09 ENCOUNTER — Other Ambulatory Visit: Payer: Self-pay | Admitting: Cardiology

## 2022-06-09 DIAGNOSIS — I25110 Atherosclerotic heart disease of native coronary artery with unstable angina pectoris: Secondary | ICD-10-CM

## 2022-06-22 ENCOUNTER — Other Ambulatory Visit: Payer: Self-pay | Admitting: Cardiology

## 2022-06-22 ENCOUNTER — Telehealth: Payer: Self-pay | Admitting: Emergency Medicine

## 2022-06-22 DIAGNOSIS — I5032 Chronic diastolic (congestive) heart failure: Secondary | ICD-10-CM

## 2022-06-22 DIAGNOSIS — Z77090 Contact with and (suspected) exposure to asbestos: Secondary | ICD-10-CM

## 2022-06-23 NOTE — Telephone Encounter (Signed)
I called the patient and left a message for him to call back for clarification to see if he was wanting a new CT scan and an office visit.

## 2022-06-24 NOTE — Telephone Encounter (Signed)
Patient states usually gets CT scan done yearly. Patient phone number is [phone].

## 2022-06-24 NOTE — Telephone Encounter (Signed)
[name], please advise on this if you want order placed for CT and when you want it to be done.

## 2022-06-24 NOTE — Telephone Encounter (Signed)
Because a CT scan had been stable my plan was to check a chest x-ray this year (September 2023) and to probably alternate years checking a CT.  If he would feel more comfortable having a dedicated CT then I am okay with ordering a CT chest without contrast for September 2023 with plans to follow-up with me to review.

## 2022-06-25 NOTE — Telephone Encounter (Signed)
Called and spoke with pt letting him know the  info per RB and he verbalized understanding. Appt scheduled for pt and also made comment that this will need to be with cxr prior. Nothing further needed.

## 2022-08-10 ENCOUNTER — Ambulatory Visit (INDEPENDENT_AMBULATORY_CARE_PROVIDER_SITE_OTHER): Payer: No Typology Code available for payment source | Admitting: Emergency Medicine

## 2022-08-10 ENCOUNTER — Ambulatory Visit (INDEPENDENT_AMBULATORY_CARE_PROVIDER_SITE_OTHER): Payer: No Typology Code available for payment source

## 2022-08-10 ENCOUNTER — Encounter: Payer: Self-pay | Admitting: Emergency Medicine

## 2022-08-10 DIAGNOSIS — J668 Airway disease due to other specific organic dusts: Secondary | ICD-10-CM

## 2022-08-10 DIAGNOSIS — Z77090 Contact with and (suspected) exposure to asbestos: Secondary | ICD-10-CM

## 2022-08-10 DIAGNOSIS — J4520 Mild intermittent asthma, uncomplicated: Secondary | ICD-10-CM | POA: Diagnosis not present

## 2022-08-10 DIAGNOSIS — I771 Stricture of artery: Secondary | ICD-10-CM | POA: Diagnosis not present

## 2022-08-10 DIAGNOSIS — R918 Other nonspecific abnormal finding of lung field: Secondary | ICD-10-CM | POA: Diagnosis not present

## 2022-08-10 NOTE — Assessment & Plan Note (Addendum)
Mild obstruction.  He never needs albuterol.  He does have it available to use if needed.  Good functional capacity.  Discussed vaccines with him today.  He is planning to get the flu shot, COVID-19 shot.  He is also contemplating in the RSV shot.

## 2022-08-10 NOTE — Assessment & Plan Note (Signed)
Overall clinically stable with good functional capacity.  His chest imaging has had some very subtle peripheral interstitial change without honeycomb.  There is been no progression.  His chest x-ray today is stable.  I think we can hold off on a repeat CT for now, plan to do this in September 2024 which would be a 2-year interval.  We will also repeat his pulmonary function testing at that time.  If he clinically changes sooner than that then we will do both tests early.

## 2022-08-10 NOTE — Addendum Note (Signed)
Addended by: Loma Sousa on: 08/10/2022 03:54 PM   Modules accepted: Orders

## 2022-08-10 NOTE — Patient Instructions (Signed)
We reviewed the chest x-ray today. We will plan to repeat your CT scan of the chest in September 2024 We will plan to repeat your pulmonary function testing in September 2024. Please call our office if you develop any new respiratory symptoms, shortness of breath.  If so we may decide to do your testing sooner. Follow with [name] in September 2024 after your testing so we can review the results together.  Call sooner if you have problems.

## 2022-08-10 NOTE — Progress Notes (Signed)
HPI:  ROV 07/14/21 --visit for pleasant 71 year old gentleman with a history of occupational asbestos exposure.  Also a former smoker.  He has moderate obstructive lung disease on pulmonary function testing.  We repeated his CT scan of the chest this month as below, pulmonary function testing today.  He reports that he is doing well - good functional capacity. He exercises frequently.  He has albuterol that he can use as needed, is not on any scheduled bronchodilators.  Has used it rarely - did help him last year when he had a URI. He had COVID in June 2022, URI sx, took anti-virals. Took months for him to feel back to normal.  He is on Allegra, Protonix  Pulmonary function testing performed today and reviewed by me shows mild obstruction without a bronchodilator response, normal lung volumes and a decreased diffusion capacity.  His FEV1 is improved compared with September 2020 and August 2018.  Total lung capacity is also improved.  CT scan of the chest performed 07/03/2021 reviewed by me showed no evidence of pleural disease or calcifications, plaques.  There are some very subtle septal thickening and peripheral groundglass change without any honeycomb.  Unchanged pulmonary nodules with some calcification.  ROV 08/10/22 --Mr. Gatt is 11, former smoker with an occupational asbestos exposure.  He has mild to moderate obstructive lung disease on pulmonary function testing.  Normal volumes and diffusion capacity.  Chest imaging has not shown any specific sequela of asbestos exposure but there is some very subtle septal thickening and peripheral groundglass change without honeycomb.  He has not been on any scheduled bronchodilator therapy.  He does have albuterol, but he never needs it. Good functional capacity - he walks, is able to exert. He has a lot of congestion and drainage, minimal cough.   Chest x-ray performed today and reviewed by me shows normal cardiac silhouette, some coarsened peripheral  interstitial markings unchanged compared with prior.  No pleural plaques or evidence of any pleural disease.  No infiltrates.    Vitals:   08/10/22 1517  BP: 120/82  Pulse: 78  Temp: 97.8 F (36.6 C)  SpO2: 94%  Weight: 222 lb 12.8 oz (101.1 kg)  Height: '6\' 2"'$  (1.88 m)   Gen: Pleasant, well-nourished, in no distress,  normal affect  ENT: No lesions,  mouth clear,  oropharynx clear, no postnasal drip  Neck: No JVD, no stridor  Lungs: No use of accessory muscles, clear B   Cardiovascular: RRR, heart sounds normal, no murmur or gallops, no peripheral edema  Musculoskeletal: No deformities, no cyanosis or clubbing  Neuro: alert, non focal  Skin: Warm, no lesions or rashes  PULMONARY FUNCTON TEST 04/09/2010 04/28/2011 05/04/2012 06/06/2013  FVC 4.98 4.51 4.42 4.27  FEV1 3.68 3.2 3.24 3.1  FEV1/FVC 73.9 71 73.3 72.6  FVC  % Predicted 92 87 85 80  FEV % Predicted 90 88 90 78  FeF 25-75 2.66 2.01 2.32 4.09  FeF 25-75 % Predicted 3.32 3.29 3.25 5.04     ASSESSMENT/PLAN:  Asbestos exposure Overall clinically stable with good functional capacity.  His chest imaging has had some very subtle peripheral interstitial change without honeycomb.  There is been no progression.  His chest x-ray today is stable.  I think we can hold off on a repeat CT for now, plan to do this in September 2024 which would be a 2-year interval.  We will also repeat his pulmonary function testing at that time.  If he clinically changes sooner than that  then we will do both tests early.  Asthma Mild obstruction.  He never needs albuterol.  He does have it available to use if needed.  Good functional capacity.  Discussed vaccines with him today.  He is planning to get the flu shot, COVID-19 shot.  He is also contemplating in the RSV shot.   Baltazar Apo, MD, PhD 08/10/2022, 3:41 PM [facility] [phone] or if no answer [phone]

## 2022-08-16 DIAGNOSIS — Z23 Encounter for immunization: Secondary | ICD-10-CM | POA: Diagnosis not present

## 2022-08-18 ENCOUNTER — Other Ambulatory Visit: Payer: Self-pay | Admitting: Cardiology

## 2022-08-18 DIAGNOSIS — I25110 Atherosclerotic heart disease of native coronary artery with unstable angina pectoris: Secondary | ICD-10-CM

## 2022-08-28 ENCOUNTER — Encounter (INDEPENDENT_AMBULATORY_CARE_PROVIDER_SITE_OTHER): Payer: Self-pay | Admitting: Gastroenterology

## 2022-09-01 DIAGNOSIS — Z789 Other specified health status: Secondary | ICD-10-CM | POA: Diagnosis not present

## 2022-09-01 DIAGNOSIS — Z79899 Other long term (current) drug therapy: Secondary | ICD-10-CM | POA: Diagnosis not present

## 2022-09-01 DIAGNOSIS — Z125 Encounter for screening for malignant neoplasm of prostate: Secondary | ICD-10-CM | POA: Diagnosis not present

## 2022-09-01 DIAGNOSIS — Z1339 Encounter for screening examination for other mental health and behavioral disorders: Secondary | ICD-10-CM | POA: Diagnosis not present

## 2022-09-01 DIAGNOSIS — Z0000 Encounter for general adult medical examination without abnormal findings: Secondary | ICD-10-CM | POA: Diagnosis not present

## 2022-09-01 DIAGNOSIS — R5383 Other fatigue: Secondary | ICD-10-CM | POA: Diagnosis not present

## 2022-09-01 DIAGNOSIS — Z6830 Body mass index (BMI) 30.0-30.9, adult: Secondary | ICD-10-CM | POA: Diagnosis not present

## 2022-09-01 DIAGNOSIS — Z1331 Encounter for screening for depression: Secondary | ICD-10-CM | POA: Diagnosis not present

## 2022-09-01 DIAGNOSIS — E7800 Pure hypercholesterolemia, unspecified: Secondary | ICD-10-CM | POA: Diagnosis not present

## 2022-09-01 DIAGNOSIS — I10 Essential (primary) hypertension: Secondary | ICD-10-CM | POA: Diagnosis not present

## 2022-09-01 DIAGNOSIS — Z7189 Other specified counseling: Secondary | ICD-10-CM | POA: Diagnosis not present

## 2022-09-01 DIAGNOSIS — Z299 Encounter for prophylactic measures, unspecified: Secondary | ICD-10-CM | POA: Diagnosis not present

## 2022-09-27 ENCOUNTER — Ambulatory Visit: Payer: Medicare Other | Admitting: Cardiology

## 2022-09-27 ENCOUNTER — Encounter: Payer: Self-pay | Admitting: Cardiology

## 2022-09-27 VITALS — BP 134/89 | HR 61 | Resp 16 | Ht 74.0 in | Wt 224.6 lb

## 2022-09-27 DIAGNOSIS — I25118 Atherosclerotic heart disease of native coronary artery with other forms of angina pectoris: Secondary | ICD-10-CM | POA: Diagnosis not present

## 2022-09-27 DIAGNOSIS — N1831 Chronic kidney disease, stage 3a: Secondary | ICD-10-CM | POA: Diagnosis not present

## 2022-09-27 DIAGNOSIS — I447 Left bundle-branch block, unspecified: Secondary | ICD-10-CM | POA: Diagnosis not present

## 2022-09-27 DIAGNOSIS — I25110 Atherosclerotic heart disease of native coronary artery with unstable angina pectoris: Secondary | ICD-10-CM | POA: Diagnosis not present

## 2022-09-27 DIAGNOSIS — I5032 Chronic diastolic (congestive) heart failure: Secondary | ICD-10-CM | POA: Diagnosis not present

## 2022-09-27 DIAGNOSIS — E7800 Pure hypercholesterolemia, unspecified: Secondary | ICD-10-CM | POA: Diagnosis not present

## 2022-09-27 NOTE — Progress Notes (Signed)
Primary Physician/Referring:  Glenda Chroman, MD  Patient ID: Franklin Jones, male    DOB: 05-12-1951, 71 y.o.   MRN: [id number]  Chief Complaint  Patient presents with   Coronary Artery Disease   Congestive Heart Failure   Hypertension   Follow-up    6 months   HPI:    Franklin Jones  is a 71 y.o. Caucasian male  with hypertension, stage III chronic kidney disease, hyperlipidemia, coronary artery disease, complete occlusion of proximal LAD to mid LAD and acute marginal of RCA by coronary angiography on 12/28/2017 at which time his EF was 20-25% and initially presented with florid CHF. He is S/P Mid LAD PCI in June 2019 with improvement in EF to 45%.   His anginal symptoms of burning sensation of the chest with exertional activities.  He has not had any further episodes since angioplasty.  He is presently doing well and has not had any recurrence of angina pectoris, dyspnea has remained stable.  No PND or orthopnea.  Past Medical History:  Diagnosis Date   Acute on chronic combined systolic and diastolic CHF (congestive heart failure) ([place]) 12/15/2017   Acute on chronic heart failure ([place]) 12/15/2017   Allergic rhinitis    Arthritis    "qwhere" (6//19/2019)   Asbestosis(501)    followed by Dr Barkley Boards- "on monitoring only; stable x years" (04/05/2018)   Basal cell carcinoma    "face; head" (04/05/2018)   Degenerative arthritis of knee 11/19/2011   Dyspnea on exertion 02/01/2019   GERD (gastroesophageal reflux disease)    High cholesterol    History of asbestos exposure    followed by Dr West Carbo- states stable years- last CT chest 7/12 in Epic   Hypertension    LBBB (left bundle branch block) 01/31/2018   Shortness of breath    SOME SOB BUT CAN CLIMB FLIGHT OF STAIRS   Squamous carcinoma    "back of neck" (04/05/2018)   Past Surgical History:  Procedure Laterality Date   CATARACT EXTRACTION, BILATERAL Bilateral 2020   COLONOSCOPY     COLONOSCOPY N/A 08/22/2014   Procedure:  COLONOSCOPY;  Surgeon: Rogene Houston, MD;  [facility]: [facility];  Service: Endoscopy;  Laterality: N/A;  80   CORONARY ANGIOPLASTY WITH STENT PLACEMENT  04/05/2018   CTO PCI of the mid LAD with DES x 2.   CORONARY CTO INTERVENTION N/A 04/05/2018   Procedure: CORONARY CTO INTERVENTION;  Surgeon: Martinique, Peter M, MD;  Location: [facility];  Service: Cardiovascular;  Laterality: N/A;   FINGER AMPUTATION Left 2009   traumatic injury left small finger with amputation   JOINT REPLACEMENT     KNEE ARTHROPLASTY  11/19/2011   Procedure: COMPUTER ASSISTED TOTAL KNEE ARTHROPLASTY;  Surgeon: Mcarthur Rossetti, MD;  Location: [facility];  Service: Orthopedics;  Laterality: Right;  Right Total Knee Arthroplasty   MASS EXCISION Left 03/12/2014   Procedure: EXCISION LEFT NECK SKIN MASS;  Surgeon: Gayland Curry, MD;  Location: [facility];  Service: General;  Laterality: Left;   RIGHT/LEFT HEART CATH AND CORONARY ANGIOGRAPHY N/A 12/27/2017   Procedure: RIGHT/LEFT HEART CATH AND CORONARY ANGIOGRAPHY;  Surgeon: Adrian Prows, MD;  Location: [facility];  Service: Cardiovascular;  Laterality: N/A;   TOTAL KNEE ARTHROPLASTY  08/18/2012   Procedure: TOTAL KNEE ARTHROPLASTY;  Surgeon: Mcarthur Rossetti, MD;  Location: [facility];  Service: Orthopedics;  Laterality: Left;  Left Total Knee Arthroplasty   ULTRASOUND GUIDANCE  FOR VASCULAR ACCESS  04/05/2018   Procedure: Ultrasound Guidance For Vascular Access;  Surgeon: Martinique, Peter M, MD;  Location: [facility];  Service: Cardiovascular;;   Family History  Problem Relation Age of Onset   Allergies Mother    Heart disease Father    Lung cancer Brother    Heart failure Brother    Social History   Tobacco Use   Smoking status: Former    Packs/day: 1.00    Years: 20.00    Total pack years: 20.00    Types: Cigarettes    Quit date: 10/18/1985    Years since quitting: 36.9   Smokeless tobacco: Never  Substance Use Topics    Alcohol use: Yes    Alcohol/week: 9.0 standard drinks of alcohol    Types: 3 Cans of beer, 6 Standard drinks or equivalent per week    Comment: OCC   Marital Status: Married  ROS  Review of Systems  Cardiovascular:  Positive for dyspnea on exertion (stable) and palpitations. Negative for chest pain and leg swelling.  All other systems reviewed and are negative.  Objective      09/27/2022   11:22 AM 08/10/2022    3:17 PM 03/25/2022   11:48 AM  Vitals with BMI  Height _0  _1  _2   Weight 224 lbs 10 oz 222 lbs 13 oz 225 lbs 13 oz  BMI 28.82 37.16 96.78  Systolic 938 101 751  Diastolic 89 82 68  Pulse 61 78 65    Blood pressure 134/89, pulse 61, resp. rate 16, height _3  (1.88 m), weight 224 lb 9.6 oz (101.9 kg), SpO2 94 %. Body mass index is 28.84 kg/m.      09/27/2022   11:22 AM 08/10/2022    3:17 PM 03/25/2022   11:48 AM  Vitals with BMI  Height _4  _5  _6   Weight 224 lbs 10 oz 222 lbs 13 oz 225 lbs 13 oz  BMI 28.82 02.58 52.77  Systolic 824 235 361  Diastolic 89 82 68  Pulse 61 78 65     Physical Exam Neck:     Vascular: No carotid bruit or JVD.  Cardiovascular:     Rate and Rhythm: Normal rate and regular rhythm.     Pulses: Normal pulses and intact distal pulses.     Heart sounds: Normal heart sounds. No murmur heard.    No gallop.  Pulmonary:     Effort: Pulmonary effort is normal.     Breath sounds: Normal breath sounds.  Abdominal:     General: Bowel sounds are normal.     Palpations: Abdomen is soft.  Musculoskeletal:     Right lower leg: No edema.     Left lower leg: No edema.  Skin:    General: Skin is warm.    Laboratory examination:   External labs   Labs Cholesterol, total 109.000 m 09/01/2022 HDL 42.000 mg 09/01/2022 LDL 50.000 mg 09/01/2022 Triglycerides 83.000 mg 09/01/2022  Hemoglobin 14.900 g/d 09/01/2022 Platelets 185.000 x1 09/01/2022  Creatinine, Serum 1.540 mg/ 09/01/2022 Potassium 4.700 mm 09/01/2022 ALT  (SGPT) 18.000 IU/ 09/01/2022  TSH 3.980 09/01/2022  Hemoglobin 15.500 G/ 08/25/2021 Platelets 195.000 X1 08/25/2021  Creatinine, Serum 1.390 MG/ 08/25/2021 ALT (SGPT) 16.000 IU/ 08/25/2021 eGFR 46  TSH 4.090 08/25/2021   Medications and allergies   Allergies  Allergen Reactions   Penicillins Rash    Has patient had a PCN reaction causing immediate rash, facial/tongue/throat swelling, SOB or  lightheadedness with hypotension: unkn Has patient had a PCN reaction causing severe rash involving mucus membranes or skin necrosis: unkn Has patient had a PCN reaction that required hospitalization: no Has patient had a PCN reaction occurring within the last 10 years: no If all of the above answers are "NO", then may proceed with Cephalosporin use.      Current Outpatient Medications:    aspirin EC 81 MG tablet, Take 1 tablet (81 mg total) by mouth daily., Disp: 90 tablet, Rfl: 3   carvedilol (COREG) 12.5 MG tablet, TAKE (1) TABLET TWICE DAILY., Disp: 180 tablet, Rfl: 0   fexofenadine (ALLEGRA) 180 MG tablet, Take 180 mg by mouth daily., Disp: , Rfl:    losartan (COZAAR) 25 MG tablet, TAKE 1 TABLET BY MOUTH EVERY MORNING., Disp: 90 tablet, Rfl: 0   Multiple Vitamins-Minerals (CENTRUM SILVER 50+MEN) TABS, Take 1 tablet by mouth daily., Disp: , Rfl:    nitroGLYCERIN (NITROSTAT) 0.4 MG SL tablet, PLACE (1) TABLET UNDER TONGUE EVERY 5 MINUTES UP TO (3) DOSES. IF NO RELIEF CALL 911., Disp: 25 tablet, Rfl: 0   rosuvastatin (CRESTOR) 10 MG tablet, TAKE 1 TABLET ONCE DAILY., Disp: 90 tablet, Rfl: 0   sildenafil (VIAGRA) 100 MG tablet, Take by mouth daily., Disp: , Rfl:    VENTOLIN HFA 108 (90 Base) MCG/ACT inhaler, INHALE 2 PUFFS EVERY 4 HOURS AS NEEDED FOR WHEEZING OR SHORTNESS OF BREATH., Disp: 18 g, Rfl: 3   Radiology:   CT chest 07/03/2021 without contrast: 1. Cardiovascular: Heart size is normal. There is no significant pericardial fluid, thickening or pericardial calcification. There is aortic  atherosclerosis, as well as atherosclerosis of the great vessels of the mediastinum and the coronary arteries, including calcified atherosclerotic plaque in the left main, left anterior descending, left circumflex and right coronary arteries. 2. No calcified pleural plaques to suggest asbestos related pleural disease. Subtle changes in the lungs which could be indicative of early or mild interstitial lung disease.  Cardiac Studies:   Coronary angiogram 04/08/2018 Successful CTO PCI of the mid LAD with DES x 2. (Synergy 2.5 x 24 and 2.25 x 38 mm DES. Medical therapy for CTO RCA, flow improved since prior angiography on 12/28/2017. Diffusely diseased RCA Mild disease Cx.   CTO PCI of the mid LAD with DES x 2. (Synergy 2.5 x 24 and 2.25 x 38 mm DES.  Echocardiogram 11/14/2019:  Left ventricle cavity is minimally dilated at 5.4 cm. Moderate LVH. Mildly depressed LV systolic function with EF 45%. Doppler evidence of grade I (impaired) diastolic dysfunction, elevated LAP.  Left ventricle  regional wall motion findings: Basal anteroseptal, Basal inferoseptal, Mid anteroseptal, Mid inferoseptal, Apical anterior and Apical septal mild  hypokinesis.  Calculated EF 45%.  Trileaflet aortic valve with no regurgitation noted. Mild aortic valve  leaflet thickening.  Grossly normal pulmonic valve.  Mild to moderate pulmonic regurgitation.  No significant change from 07/18/2018.  Compared tp previous study on 03/22/2018, LVEF has significantly improved from 20%.  Lexiscan (Walking with mod Bruce)Tetrofosmin Stress Test  12/17/2019: Non-diagnostic ECG stress. Myocardial perfusion is abnormal. There is a small sized fixed moderate defect in the distal anterior and apical regions.  Overall LV systolic function is  mildly abnormal with distal anterior and apical hypokinesis. Stress LV EF: 44%.  No previous exam available for comparison. Intermediate risk study.    EKG    EKG: 1123: Normal sinus rhythm at  rate of 63 bpm, left atrial enlargement, left bundle branch block.  Compared  to 03/25/2022, left bundle branch block is new.  EKG 03/25/2022: Sinus bradycardia at the rate of 56 bpm with borderline first-degree block, PR interval 200 ms, normal axis, no evidence of ischemia, normal EKG.  No significant change from 03/19/2021. Compared to 04/27/2018, LBBB not present.  Assessment     ICD-10-CM   1. Coronary artery disease of native artery of native heart with stable angina pectoris (HCC)  I25.118 EKG 12-Lead    PCV ECHOCARDIOGRAM COMPLETE    PCV MYOCARDIAL PERFUSION WITH LEXISCAN    2. Chronic diastolic CHF (congestive heart failure) (HCC)  I50.32     3. Hypercholesteremia  E78.00     4. Chronic renal failure, stage 3a (HCC)  N18.31     5. LBBB (left bundle branch block)  I44.7 PCV ECHOCARDIOGRAM COMPLETE    PCV MYOCARDIAL PERFUSION WITH LEXISCAN       Recommendations:   Franklin Jones  is a 71 y.o. Caucasian male  with hypertension, stage III chronic kidney disease, hyperlipidemia, coronary artery disease, complete occlusion of proximal LAD to mid LAD and acute marginal of RCA by coronary angiography on 12/28/2017 at which time his EF was 20-25% and initially presented with florid CHF. He is S/P Mid LAD PCI in June 2019 with improvement in EF to 45%.   1. Coronary artery disease of native artery of native heart with stable angina pectoris [facility]) Patient remotely in 2019 had left bundle branch block.  This is when he was diagnosed with acute decompensated heart failure and underwent CTO intervention and his LVEF had significantly improved.  Now there is recurrence of LBBB, this is new, I will set him up for Lexiscan nuclear stress test and repeat echocardiogram.  2. Chronic diastolic CHF (congestive heart failure) ([place]) Presently doing well, except for mild chronic dyspnea, no other specific complaints.  Continue present medical regimen.  Blood pressure is well-controlled.  3.  Hypercholesteremia Lipids under excellent control.  Continue Crestor.  4. Chronic renal failure, stage 3a (HCC) Stage IIIa chronic kidney disease has remained stable, external labs reviewed.  5. LBBB (left bundle branch block) As dictated above left bundle branch block is new since coronary revascularization in 2019.  Further cardiac workup recommended today and I would like to see him back for follow-up in 6 to 8 weeks.    Adrian Prows, MD, [facility] 09/27/2022, 4:47 PM Office: [phone] Fax: [phone] Pager: [phone]

## 2022-09-28 ENCOUNTER — Other Ambulatory Visit: Payer: Self-pay | Admitting: Cardiology

## 2022-09-28 DIAGNOSIS — I5032 Chronic diastolic (congestive) heart failure: Secondary | ICD-10-CM

## 2022-10-12 ENCOUNTER — Other Ambulatory Visit: Payer: Self-pay | Admitting: Cardiology

## 2022-10-12 DIAGNOSIS — I25110 Atherosclerotic heart disease of native coronary artery with unstable angina pectoris: Secondary | ICD-10-CM

## 2022-10-13 ENCOUNTER — Other Ambulatory Visit: Payer: Self-pay | Admitting: Cardiology

## 2022-10-26 ENCOUNTER — Ambulatory Visit: Payer: Medicare Other

## 2022-10-26 DIAGNOSIS — I25118 Atherosclerotic heart disease of native coronary artery with other forms of angina pectoris: Secondary | ICD-10-CM | POA: Diagnosis not present

## 2022-10-26 DIAGNOSIS — I447 Left bundle-branch block, unspecified: Secondary | ICD-10-CM

## 2022-10-26 LAB — PCV MYOCARDIAL PERFUSION WITH LEXISCAN: ST Depression (mm): 0 mm

## 2022-11-11 ENCOUNTER — Encounter: Payer: Self-pay | Admitting: Cardiology

## 2022-11-11 ENCOUNTER — Ambulatory Visit: Payer: Medicare Other | Admitting: Cardiology

## 2022-11-11 VITALS — BP 120/72 | HR 66 | Resp 16 | Ht 74.0 in | Wt 221.0 lb

## 2022-11-11 DIAGNOSIS — I447 Left bundle-branch block, unspecified: Secondary | ICD-10-CM | POA: Diagnosis not present

## 2022-11-11 DIAGNOSIS — N1831 Chronic kidney disease, stage 3a: Secondary | ICD-10-CM

## 2022-11-11 DIAGNOSIS — R9439 Abnormal result of other cardiovascular function study: Secondary | ICD-10-CM

## 2022-11-11 DIAGNOSIS — I5022 Chronic systolic (congestive) heart failure: Secondary | ICD-10-CM

## 2022-11-11 DIAGNOSIS — I25118 Atherosclerotic heart disease of native coronary artery with other forms of angina pectoris: Secondary | ICD-10-CM | POA: Diagnosis not present

## 2022-11-11 NOTE — Patient Instructions (Signed)
Please have blood work done at [facility], you can have this done anywhere as long as you go to the laboratory.  He will be scheduled for cardiac catheterization.  Mr.

## 2022-11-11 NOTE — H&P (View-Only) (Signed)
Primary Physician/Referring:  Glenda Chroman, MD  Patient ID: Franklin Jones, male    DOB: [birth year], 72 y.o.   MRN: [id number]  Chief Complaint  Patient presents with   Coronary Artery Disease   Follow-up    6 week   HPI:    Franklin Jones  is a 72 y.o. Caucasian male  with hypertension, stage III chronic kidney disease, hyperlipidemia, coronary artery disease, complete occlusion of proximal LAD to mid LAD and acute marginal of RCA by coronary angiography on 12/28/2017 at which time his EF was 20-25% and initially presented with florid CHF. He is S/P Mid LAD PCI in June 2019 with improvement in EF to 45%.   His anginal symptoms of burning sensation "gas" of the chest with exertional activities and he states he continue to have these if he walks for 10 min and he burps and feels better and continues his activity. Dyspnea has remained stable.  No PND or orthopnea.  Past Medical History:  Diagnosis Date   Acute on chronic combined systolic and diastolic CHF (congestive heart failure) ([place]) 12/15/2017   Acute on chronic heart failure ([place]) 12/15/2017   Allergic rhinitis    Arthritis    "qwhere" (6//19/2019)   Asbestosis(501)    followed by Dr Barkley Boards- "on monitoring only; stable x years" (04/05/2018)   Basal cell carcinoma    "face; head" (04/05/2018)   Degenerative arthritis of knee 11/19/2011   Dyspnea on exertion 02/01/2019   GERD (gastroesophageal reflux disease)    High cholesterol    History of asbestos exposure    followed by Dr West Carbo- states stable years- last CT chest 7/12 in Epic   Hypertension    LBBB (left bundle branch block) 01/31/2018   Shortness of breath    SOME SOB BUT CAN CLIMB FLIGHT OF STAIRS   Squamous carcinoma    "back of neck" (04/05/2018)   Past Surgical History:  Procedure Laterality Date   CATARACT EXTRACTION, BILATERAL Bilateral 2020   COLONOSCOPY     COLONOSCOPY N/A 08/22/2014   Procedure: COLONOSCOPY;  Surgeon: Rogene Houston, MD;  Location: [facility];  Service: Endoscopy;  Laterality: N/A;  106   CORONARY ANGIOPLASTY WITH STENT PLACEMENT  04/05/2018   CTO PCI of the mid LAD with DES x 2.   CORONARY CTO INTERVENTION N/A 04/05/2018   Procedure: CORONARY CTO INTERVENTION;  Surgeon: Martinique, Peter M, MD;  Location: [place] [facility];  Service: Cardiovascular;  Laterality: N/A;   FINGER AMPUTATION Left 2009   traumatic injury left small finger with amputation   JOINT REPLACEMENT     KNEE ARTHROPLASTY  11/19/2011   Procedure: COMPUTER ASSISTED TOTAL KNEE ARTHROPLASTY;  Surgeon: Mcarthur Rossetti, MD;  Location: [facility];  Service: Orthopedics;  Laterality: Right;  Right Total Knee Arthroplasty   MASS EXCISION Left 03/12/2014   Procedure: EXCISION LEFT NECK SKIN MASS;  Surgeon: Gayland Curry, MD;  Location: [facility];  Service: General;  Laterality: Left;   RIGHT/LEFT HEART CATH AND CORONARY ANGIOGRAPHY N/A 12/27/2017   Procedure: RIGHT/LEFT HEART CATH AND CORONARY ANGIOGRAPHY;  Surgeon: Adrian Prows, MD;  Location: [facility];  Service: Cardiovascular;  Laterality: N/A;   TOTAL KNEE ARTHROPLASTY  08/18/2012   Procedure: TOTAL KNEE ARTHROPLASTY;  Surgeon: Mcarthur Rossetti, MD;  Location: [facility];  Service: Orthopedics;  Laterality: Left;  Left Total Knee Arthroplasty   ULTRASOUND GUIDANCE FOR VASCULAR ACCESS  04/05/2018   Procedure:  Ultrasound Guidance For Vascular Access;  Surgeon: Martinique, Peter M, MD;  Location: [facility] [facility];  Service: Cardiovascular;;   Family History  Problem Relation Age of Onset   Allergies Mother    Heart disease Father    Lung cancer Brother    Heart failure Brother    Social History   Tobacco Use   Smoking status: Former    Packs/day: 1.00    Years: 20.00    Total pack years: 20.00    Types: Cigarettes    Quit date: 10/18/1985    Years since quitting: 37.0   Smokeless tobacco: Never  Substance Use Topics   Alcohol use: Yes    Alcohol/week: 9.0 standard drinks of  alcohol    Types: 3 Cans of beer, 6 Standard drinks or equivalent per week    Comment: OCC   Marital Status: Married  ROS  Review of Systems  Cardiovascular:  Positive for chest pain ("Gas") and dyspnea on exertion (stable). Negative for leg swelling and palpitations.  All other systems reviewed and are negative.  Objective      11/11/2022    1:01 PM 09/27/2022   11:22 AM 08/10/2022    3:17 PM  Vitals with BMI  Height '6\' 2"'$  '6\' 2"'$  '6\' 2"'$   Weight 221 lbs 224 lbs 10 oz 222 lbs 13 oz  BMI 28.36 93.57 01.77  Systolic 939 030 092  Diastolic 72 89 82  Pulse 66 61 78    Blood pressure 120/72, pulse 66, resp. rate 16, height '6\' 2"'$  (1.88 m), weight 221 lb (100.2 kg), SpO2 95 %. Body mass index is 28.37 kg/m.      11/11/2022    1:01 PM 09/27/2022   11:22 AM 08/10/2022    3:17 PM  Vitals with BMI  Height '6\' 2"'$  '6\' 2"'$  '6\' 2"'$   Weight 221 lbs 224 lbs 10 oz 222 lbs 13 oz  BMI 28.36 33.00 76.22  Systolic 633 354 562  Diastolic 72 89 82  Pulse 66 61 78     Physical Exam Neck:     Vascular: No carotid bruit or JVD.  Cardiovascular:     Rate and Rhythm: Normal rate and regular rhythm.     Pulses: Normal pulses and intact distal pulses.     Heart sounds: Normal heart sounds. No murmur heard.    No gallop.  Pulmonary:     Effort: Pulmonary effort is normal.     Breath sounds: Normal breath sounds.  Abdominal:     General: Bowel sounds are normal.     Palpations: Abdomen is soft.  Musculoskeletal:     Right lower leg: No edema.     Left lower leg: No edema.  Skin:    General: Skin is warm.    Laboratory examination:   External labs   Labs Cholesterol, total 109.000 m 09/01/2022 HDL 42.000 mg 09/01/2022 LDL 50.000 mg 09/01/2022 Triglycerides 83.000 mg 09/01/2022  Hemoglobin 14.900 g/d 09/01/2022 Platelets 185.000 x1 09/01/2022  Creatinine, Serum 1.540 mg/ 09/01/2022 Potassium 4.700 mm 09/01/2022 ALT (SGPT) 18.000 IU/ 09/01/2022  TSH 3.980 09/01/2022  Hemoglobin  15.500 G/ 08/25/2021 Platelets 195.000 X1 08/25/2021  Creatinine, Serum 1.390 MG/ 08/25/2021 ALT (SGPT) 16.000 IU/ 08/25/2021 eGFR 46  TSH 4.090 08/25/2021   Medications and allergies   Allergies  Allergen Reactions   Penicillins Rash    Has patient had a PCN reaction causing immediate rash, facial/tongue/throat swelling, SOB or lightheadedness with hypotension: unkn Has patient had a PCN reaction causing  severe rash involving mucus membranes or skin necrosis: unkn Has patient had a PCN reaction that required hospitalization: no Has patient had a PCN reaction occurring within the last 10 years: no If all of the above answers are "NO", then may proceed with Cephalosporin use.      Current Outpatient Medications:    aspirin EC 81 MG tablet, Take 1 tablet (81 mg total) by mouth daily., Disp: 90 tablet, Rfl: 3   carvedilol (COREG) 12.5 MG tablet, TAKE (1) TABLET TWICE DAILY., Disp: 180 tablet, Rfl: 0   fexofenadine (ALLEGRA) 180 MG tablet, Take 180 mg by mouth daily., Disp: , Rfl:    losartan (COZAAR) 25 MG tablet, TAKE 1 TABLET BY MOUTH EVERY MORNING., Disp: 90 tablet, Rfl: 0   Multiple Vitamins-Minerals (CENTRUM SILVER 50+MEN) TABS, Take 1 tablet by mouth daily., Disp: , Rfl:    nitroGLYCERIN (NITROSTAT) 0.4 MG SL tablet, PLACE (1) TABLET UNDER TONGUE EVERY 5 MINUTES UP TO (3) DOSES. IF NO RELIEF CALL 911., Disp: 25 tablet, Rfl: 0   rosuvastatin (CRESTOR) 10 MG tablet, TAKE 1 TABLET ONCE DAILY., Disp: 90 tablet, Rfl: 0   sildenafil (VIAGRA) 100 MG tablet, Take by mouth daily., Disp: , Rfl:    VENTOLIN HFA 108 (90 Base) MCG/ACT inhaler, INHALE 2 PUFFS EVERY 4 HOURS AS NEEDED FOR WHEEZING OR SHORTNESS OF BREATH., Disp: 18 g, Rfl: 3   pantoprazole (PROTONIX) 40 MG tablet, Take 40 mg by mouth daily as needed., Disp: , Rfl:    Radiology:   CT chest 07/03/2021 without contrast: 1. Cardiovascular: Heart size is normal. There is no significant pericardial fluid, thickening or pericardial  calcification. There is aortic atherosclerosis, as well as atherosclerosis of the great vessels of the mediastinum and the coronary arteries, including calcified atherosclerotic plaque in the left main, left anterior descending, left circumflex and right coronary arteries. 2. No calcified pleural plaques to suggest asbestos related pleural disease. Subtle changes in the lungs which could be indicative of early or mild interstitial lung disease.  Cardiac Studies:   Coronary angiogram 04/08/2018 Successful CTO PCI of the mid LAD with DES x 2. (Synergy 2.5 x 24 and 2.25 x 38 mm DES. Medical therapy for CTO RCA, flow improved since prior angiography on 12/28/2017. Diffusely diseased RCA Mild disease Cx.   CTO PCI of the mid LAD with DES x 2. (Synergy 2.5 x 24 and 2.25 x 38 mm DES.  PCV MYOCARDIAL PERFUSION WITH LEXISCAN 10/26/2022  Narrative Regadenoson Nuclear stress test 10/25/2022: Nondiagnostic ECG stress. Underlying LBBB.  The heart rate response was consistent with Regadenoson. There is a large fixed defect with no uptake in  Mid to distal anterior, septal wall and apical wall without reversibility. Overall LV systolic function is abnormal with global hypokinesis and apical akinesis. Stress LV EF: 37%. Compared to the study done on 12/17/2019, no significant change.  Previous LVEF was calculated at 44%. High risk study.  PCV ECHOCARDIOGRAM COMPLETE 10/26/2022  Narrative Echocardiogram 10/26/2022: Moderately depressed LV systolic function with visual EF 30-35%. Left ventricle cavity is normal in size. Mild hypertrophy of the left ventricle. Doppler evidence of grade I (impaired) diastolic dysfunction, normal LAP. Global and regional wall motion abnormalities: Basal anteroseptal, Basal anterior, Basal inferior, Basal inferoseptal, Mid anteroseptal, Mid anterior, Mid inferior, Mid inferoseptal, Apical lateral, Apical septal and Apical cap hypokinesis. Right ventricle cavity is normal in size.  Low normal right ventricular function. Mild (Grade I) mitral regurgitation. Mild tricuspid regurgitation. No evidence of pulmonary hypertension. RVSP measures  30 mmHg. Compared to 01/027/2021 LVEF reduced from 45% to 30-35% w/ global and regional wall motion abnormalities, Mild MR and TR are new, otherwise no significant change.   EKG   EKG: 09/27/22: Normal sinus rhythm at rate of 63 bpm, left atrial enlargement, left bundle branch block.  Compared to 03/25/2022, left bundle branch block is new.  EKG 03/25/2022: Sinus bradycardia at the rate of 56 bpm with borderline first-degree block, PR interval 200 ms, normal axis, no evidence of ischemia, normal EKG.  No significant change from 03/19/2021. Compared to 04/27/2018, LBBB not present.  Assessment     ICD-10-CM   1. Coronary artery disease of native artery of native heart with stable angina pectoris (HCC)  I25.118 CBC    Basic metabolic panel    2. LBBB (left bundle branch block)  I44.7     3. Chronic systolic heart failure (HCC)  I50.22     4. Abnormal nuclear stress test  R94.39     5. Chronic renal failure, stage 3a (HCC)  N18.31        Recommendations:   Franklin Jones  is a 72 y.o. Caucasian male  with hypertension, stage III chronic kidney disease, hyperlipidemia, coronary artery disease, complete occlusion of proximal LAD to mid LAD and acute marginal of RCA by coronary angiography on 12/28/2017 at which time his EF was 20-25% and initially presented with florid CHF. He is S/P Mid LAD PCI in June 2019 with improvement in EF to 45%.   1. Coronary artery disease of native artery of native heart with stable angina pectoris [facility]) Patient remotely in 2019 had left bundle branch block.  This is when he was diagnosed with acute decompensated heart failure and underwent CTO intervention and his LVEF had significantly improved.  Now there is recurrence of LBBB, this is new.  I set him up for nuclear stress test, clearly there is decreased  LVEF and also now eschar is appearing to be present in the LAD territory.  His symptoms of angina are "gas-like sensation in the middle of the chest with activity" and easily relieved with rest.  In view of reduced LVEF which is also new, abnormal high risk nuclear stress test, I would like to proceed with cardiac catheterization.  Schedule for cardiac catheterization, and possible angioplasty. We discussed regarding risks, benefits, alternatives to this including stress testing, CTA and continued medical therapy. Patient wants to proceed. Understands <1-2% risk of death, stroke, MI, urgent CABG, bleeding, infection, renal failure but not limited to these.   2. LBBB (left bundle branch block) As dictated above there is recurrence of LBBB.  3. Chronic systolic heart failure (HCC) LVEF is also decreased from 45%.  Echocardiogram reviewed.  4. Abnormal nuclear stress test High risk nuclear stress test.  Patient is scheduled for cardiac catheterization.  5. Chronic renal failure, stage 3a ([place]) Patient does have chronic stage IIIa kidney disease, contrast exposure should be kept in mind, will also hydrate him well.  Although his LVEF is reduced, there is no clinical evidence of heart failure.  I would like to see him back after the cardiac catheterization to make further recommendations.  No changes in the medications were done today.   Adrian Prows, MD, [facility] 11/11/2022, 1:44 PM Office: [phone] Fax: [phone] Pager: [phone]

## 2022-11-11 NOTE — Progress Notes (Signed)
Primary Physician/Referring:  Glenda Chroman, MD  Patient ID: Franklin Jones, male    DOB: 12/14/1950, 72 y.o.   MRN: [id number]  Chief Complaint  Patient presents with   Coronary Artery Disease   Follow-up    6 week   HPI:    Franklin Jones  is a 72 y.o. Caucasian male  with hypertension, stage III chronic kidney disease, hyperlipidemia, coronary artery disease, complete occlusion of proximal LAD to mid LAD and acute marginal of RCA by coronary angiography on 12/28/2017 at which time his EF was 20-25% and initially presented with florid CHF. He is S/P Mid LAD PCI in June 2019 with improvement in EF to 45%.   His anginal symptoms of burning sensation "gas" of the chest with exertional activities and he states he continue to have these if he walks for 10 min and he burps and feels better and continues his activity. Dyspnea has remained stable.  No PND or orthopnea.  Past Medical History:  Diagnosis Date   Acute on chronic combined systolic and diastolic CHF (congestive heart failure) ([place]) 12/15/2017   Acute on chronic heart failure ([place]) 12/15/2017   Allergic rhinitis    Arthritis    "qwhere" (6//19/2019)   Asbestosis([facility])    followed by Dr Barkley Boards- "on monitoring only; stable x years" (04/05/2018)   Basal cell carcinoma    "face; head" (04/05/2018)   Degenerative arthritis of knee 11/19/2011   Dyspnea on exertion 02/01/2019   GERD (gastroesophageal reflux disease)    High cholesterol    History of asbestos exposure    followed by Dr West Carbo- states stable years- last CT chest 7/12 in Epic   Hypertension    LBBB (left bundle branch block) 01/31/2018   Shortness of breath    SOME SOB BUT CAN CLIMB FLIGHT OF STAIRS   Squamous carcinoma    "back of neck" (04/05/2018)   Past Surgical History:  Procedure Laterality Date   CATARACT EXTRACTION, BILATERAL Bilateral 2020   COLONOSCOPY     COLONOSCOPY N/A 08/22/2014   Procedure: COLONOSCOPY;  Surgeon: Rogene Houston, MD;  Location: [facility];  Service: Endoscopy;  Laterality: N/A;  42   CORONARY ANGIOPLASTY WITH STENT PLACEMENT  04/05/2018   CTO PCI of the mid LAD with DES x 2.   CORONARY CTO INTERVENTION N/A 04/05/2018   Procedure: CORONARY CTO INTERVENTION;  Surgeon: Martinique, Peter M, MD;  Location: [facility];  Service: Cardiovascular;  Laterality: N/A;   FINGER AMPUTATION Left 2009   traumatic injury left small finger with amputation   JOINT REPLACEMENT     KNEE ARTHROPLASTY  11/19/2011   Procedure: COMPUTER ASSISTED TOTAL KNEE ARTHROPLASTY;  Surgeon: Mcarthur Rossetti, MD;  Location: [facility];  Service: Orthopedics;  Laterality: Right;  Right Total Knee Arthroplasty   MASS EXCISION Left 03/12/2014   Procedure: EXCISION LEFT NECK SKIN MASS;  Surgeon: Gayland Curry, MD;  Location: [facility];  Service: General;  Laterality: Left;   RIGHT/LEFT HEART CATH AND CORONARY ANGIOGRAPHY N/A 12/27/2017   Procedure: RIGHT/LEFT HEART CATH AND CORONARY ANGIOGRAPHY;  Surgeon: Adrian Prows, MD;  Location: [facility];  Service: Cardiovascular;  Laterality: N/A;   TOTAL KNEE ARTHROPLASTY  08/18/2012   Procedure: TOTAL KNEE ARTHROPLASTY;  Surgeon: Mcarthur Rossetti, MD;  Location: [facility];  Service: Orthopedics;  Laterality: Left;  Left Total Knee Arthroplasty   ULTRASOUND GUIDANCE FOR VASCULAR ACCESS  04/05/2018   Procedure:  Ultrasound Guidance For Vascular Access;  Surgeon: Martinique, Peter M, MD;  Location: [facility];  Service: Cardiovascular;;   Family History  Problem Relation Age of Onset   Allergies Mother    Heart disease Father    Lung cancer Brother    Heart failure Brother    Social History   Tobacco Use   Smoking status: Former    Packs/day: 1.00    Years: 20.00    Total pack years: 20.00    Types: Cigarettes    Quit date: 10/18/1985    Years since quitting: 37.0   Smokeless tobacco: Never  Substance Use Topics   Alcohol use: Yes    Alcohol/week: 9.0 standard drinks of  alcohol    Types: 3 Cans of beer, 6 Standard drinks or equivalent per week    Comment: OCC   Marital Status: Married  ROS  Review of Systems  Cardiovascular:  Positive for chest pain ("Gas") and dyspnea on exertion (stable). Negative for leg swelling and palpitations.  All other systems reviewed and are negative.  Objective      11/11/2022    1:01 PM 09/27/2022   11:22 AM 08/10/2022    3:17 PM  Vitals with BMI  Height '6\' 2"'$  '6\' 2"'$  '6\' 2"'$   Weight 221 lbs 224 lbs 10 oz 222 lbs 13 oz  BMI 28.36 50.09 38.18  Systolic 299 371 696  Diastolic 72 89 82  Pulse 66 61 78    Blood pressure 120/72, pulse 66, resp. rate 16, height '6\' 2"'$  (1.88 m), weight 221 lb (100.2 kg), SpO2 95 %. Body mass index is 28.37 kg/m.      11/11/2022    1:01 PM 09/27/2022   11:22 AM 08/10/2022    3:17 PM  Vitals with BMI  Height '6\' 2"'$  '6\' 2"'$  '6\' 2"'$   Weight 221 lbs 224 lbs 10 oz 222 lbs 13 oz  BMI 28.36 78.93 81.01  Systolic 751 025 852  Diastolic 72 89 82  Pulse 66 61 78     Physical Exam Neck:     Vascular: No carotid bruit or JVD.  Cardiovascular:     Rate and Rhythm: Normal rate and regular rhythm.     Pulses: Normal pulses and intact distal pulses.     Heart sounds: Normal heart sounds. No murmur heard.    No gallop.  Pulmonary:     Effort: Pulmonary effort is normal.     Breath sounds: Normal breath sounds.  Abdominal:     General: Bowel sounds are normal.     Palpations: Abdomen is soft.  Musculoskeletal:     Right lower leg: No edema.     Left lower leg: No edema.  Skin:    General: Skin is warm.    Laboratory examination:   External labs   Labs Cholesterol, total 109.000 m 09/01/2022 HDL 42.000 mg 09/01/2022 LDL 50.000 mg 09/01/2022 Triglycerides 83.000 mg 09/01/2022  Hemoglobin 14.900 g/d 09/01/2022 Platelets 185.000 x1 09/01/2022  Creatinine, Serum 1.540 mg/ 09/01/2022 Potassium 4.700 mm 09/01/2022 ALT (SGPT) 18.000 IU/ 09/01/2022  TSH 3.980 09/01/2022  Hemoglobin  15.500 G/ 08/25/2021 Platelets 195.000 X1 08/25/2021  Creatinine, Serum 1.390 MG/ 08/25/2021 ALT (SGPT) 16.000 IU/ 08/25/2021 eGFR 46  TSH 4.090 08/25/2021   Medications and allergies   Allergies  Allergen Reactions   Penicillins Rash    Has patient had a PCN reaction causing immediate rash, facial/tongue/throat swelling, SOB or lightheadedness with hypotension: unkn Has patient had a PCN reaction causing  severe rash involving mucus membranes or skin necrosis: unkn Has patient had a PCN reaction that required hospitalization: no Has patient had a PCN reaction occurring within the last 10 years: no If all of the above answers are "NO", then may proceed with Cephalosporin use.      Current Outpatient Medications:    aspirin EC 81 MG tablet, Take 1 tablet (81 mg total) by mouth daily., Disp: 90 tablet, Rfl: 3   carvedilol (COREG) 12.5 MG tablet, TAKE (1) TABLET TWICE DAILY., Disp: 180 tablet, Rfl: 0   fexofenadine (ALLEGRA) 180 MG tablet, Take 180 mg by mouth daily., Disp: , Rfl:    losartan (COZAAR) 25 MG tablet, TAKE 1 TABLET BY MOUTH EVERY MORNING., Disp: 90 tablet, Rfl: 0   Multiple Vitamins-Minerals (CENTRUM SILVER 50+MEN) TABS, Take 1 tablet by mouth daily., Disp: , Rfl:    nitroGLYCERIN (NITROSTAT) 0.4 MG SL tablet, PLACE (1) TABLET UNDER TONGUE EVERY 5 MINUTES UP TO (3) DOSES. IF NO RELIEF CALL 911., Disp: 25 tablet, Rfl: 0   rosuvastatin (CRESTOR) 10 MG tablet, TAKE 1 TABLET ONCE DAILY., Disp: 90 tablet, Rfl: 0   sildenafil (VIAGRA) 100 MG tablet, Take by mouth daily., Disp: , Rfl:    VENTOLIN HFA 108 (90 Base) MCG/ACT inhaler, INHALE 2 PUFFS EVERY 4 HOURS AS NEEDED FOR WHEEZING OR SHORTNESS OF BREATH., Disp: 18 g, Rfl: 3   pantoprazole (PROTONIX) 40 MG tablet, Take 40 mg by mouth daily as needed., Disp: , Rfl:    Radiology:   CT chest 07/03/2021 without contrast: 1. Cardiovascular: Heart size is normal. There is no significant pericardial fluid, thickening or pericardial  calcification. There is aortic atherosclerosis, as well as atherosclerosis of the great vessels of the mediastinum and the coronary arteries, including calcified atherosclerotic plaque in the left main, left anterior descending, left circumflex and right coronary arteries. 2. No calcified pleural plaques to suggest asbestos related pleural disease. Subtle changes in the lungs which could be indicative of early or mild interstitial lung disease.  Cardiac Studies:   Coronary angiogram 04/08/2018 Successful CTO PCI of the mid LAD with DES x 2. (Synergy 2.5 x 24 and 2.25 x 38 mm DES. Medical therapy for CTO RCA, flow improved since prior angiography on 12/28/2017. Diffusely diseased RCA Mild disease Cx.   CTO PCI of the mid LAD with DES x 2. (Synergy 2.5 x 24 and 2.25 x 38 mm DES.  PCV MYOCARDIAL PERFUSION WITH LEXISCAN 10/26/2022  Narrative Regadenoson Nuclear stress test 10/25/2022: Nondiagnostic ECG stress. Underlying LBBB.  The heart rate response was consistent with Regadenoson. There is a large fixed defect with no uptake in  Mid to distal anterior, septal wall and apical wall without reversibility. Overall LV systolic function is abnormal with global hypokinesis and apical akinesis. Stress LV EF: 37%. Compared to the study done on 12/17/2019, no significant change.  Previous LVEF was calculated at 44%. High risk study.  PCV ECHOCARDIOGRAM COMPLETE 10/26/2022  Narrative Echocardiogram 10/26/2022: Moderately depressed LV systolic function with visual EF 30-35%. Left ventricle cavity is normal in size. Mild hypertrophy of the left ventricle. Doppler evidence of grade I (impaired) diastolic dysfunction, normal LAP. Global and regional wall motion abnormalities: Basal anteroseptal, Basal anterior, Basal inferior, Basal inferoseptal, Mid anteroseptal, Mid anterior, Mid inferior, Mid inferoseptal, Apical lateral, Apical septal and Apical cap hypokinesis. Right ventricle cavity is normal in size.  Low normal right ventricular function. Mild (Grade I) mitral regurgitation. Mild tricuspid regurgitation. No evidence of pulmonary hypertension. RVSP measures  30 mmHg. Compared to 01/027/2021 LVEF reduced from 45% to 30-35% w/ global and regional wall motion abnormalities, Mild MR and TR are new, otherwise no significant change.   EKG   EKG: 09/27/22: Normal sinus rhythm at rate of 63 bpm, left atrial enlargement, left bundle branch block.  Compared to 03/25/2022, left bundle branch block is new.  EKG 03/25/2022: Sinus bradycardia at the rate of 56 bpm with borderline first-degree block, PR interval 200 ms, normal axis, no evidence of ischemia, normal EKG.  No significant change from 03/19/2021. Compared to 04/27/2018, LBBB not present.  Assessment     ICD-10-CM   1. Coronary artery disease of native artery of native heart with stable angina pectoris (HCC)  I25.118 CBC    Basic metabolic panel    2. LBBB (left bundle branch block)  I44.7     3. Chronic systolic heart failure (HCC)  I50.22     4. Abnormal nuclear stress test  R94.39     5. Chronic renal failure, stage 3a (HCC)  N18.31        Recommendations:   Franklin Jones  is a 71 y.o. Caucasian male  with hypertension, stage III chronic kidney disease, hyperlipidemia, coronary artery disease, complete occlusion of proximal LAD to mid LAD and acute marginal of RCA by coronary angiography on 12/28/2017 at which time his EF was 20-25% and initially presented with florid CHF. He is S/P Mid LAD PCI in June 2019 with improvement in EF to 45%.   1. Coronary artery disease of native artery of native heart with stable angina pectoris [facility]) Patient remotely in 2019 had left bundle branch block.  This is when he was diagnosed with acute decompensated heart failure and underwent CTO intervention and his LVEF had significantly improved.  Now there is recurrence of LBBB, this is new.  I set him up for nuclear stress test, clearly there is decreased  LVEF and also now eschar is appearing to be present in the LAD territory.  His symptoms of angina are "gas-like sensation in the middle of the chest with activity" and easily relieved with rest.  In view of reduced LVEF which is also new, abnormal high risk nuclear stress test, I would like to proceed with cardiac catheterization.  Schedule for cardiac catheterization, and possible angioplasty. We discussed regarding risks, benefits, alternatives to this including stress testing, CTA and continued medical therapy. Patient wants to proceed. Understands <1-2% risk of death, stroke, MI, urgent CABG, bleeding, infection, renal failure but not limited to these.   2. LBBB (left bundle branch block) As dictated above there is recurrence of LBBB.  3. Chronic systolic heart failure (HCC) LVEF is also decreased from 45%.  Echocardiogram reviewed.  4. Abnormal nuclear stress test High risk nuclear stress test.  Patient is scheduled for cardiac catheterization.  5. Chronic renal failure, stage 3a ([place]) Patient does have chronic stage IIIa kidney disease, contrast exposure should be kept in mind, will also hydrate him well.  Although his LVEF is reduced, there is no clinical evidence of heart failure.  I would like to see him back after the cardiac catheterization to make further recommendations.  No changes in the medications were done today.   Adrian Prows, MD, [facility] 11/11/2022, 1:44 PM Office: [phone] Fax: [phone] Pager: [phone]

## 2022-11-16 DIAGNOSIS — I25118 Atherosclerotic heart disease of native coronary artery with other forms of angina pectoris: Secondary | ICD-10-CM | POA: Diagnosis not present

## 2022-11-17 ENCOUNTER — Other Ambulatory Visit: Payer: Self-pay | Admitting: Cardiology

## 2022-11-17 DIAGNOSIS — I25110 Atherosclerotic heart disease of native coronary artery with unstable angina pectoris: Secondary | ICD-10-CM

## 2022-11-17 LAB — BASIC METABOLIC PANEL
BUN/Creatinine Ratio: 20 (ref 10–24)
BUN: 24 mg/dL (ref 8–27)
CO2: 22 mmol/L (ref 20–29)
Calcium: 9.4 mg/dL (ref 8.6–10.2)
Chloride: 103 mmol/L (ref 96–106)
Creatinine, Ser: 1.19 mg/dL (ref 0.76–1.27)
Glucose: 87 mg/dL (ref 70–99)
Potassium: 4.5 mmol/L (ref 3.5–5.2)
Sodium: 139 mmol/L (ref 134–144)
eGFR: 65 mL/min/{1.73_m2} (ref >59)

## 2022-11-17 LAB — CBC
Hematocrit: 42.2 % (ref 37.5–51.0)
Hemoglobin: 14.7 g/dL (ref 13.0–17.7)
MCH: 32.1 pg (ref 26.6–33.0)
MCHC: 34.8 g/dL (ref 31.5–35.7)
MCV: 92 fL (ref 79–97)
Platelets: 195 10*3/uL (ref 150–450)
RBC: 4.58 x10E6/uL (ref 4.14–5.80)
RDW: 12.1 % (ref 11.6–15.4)
WBC: 6.8 10*3/uL (ref 3.4–10.8)

## 2022-11-23 ENCOUNTER — Ambulatory Visit (HOSPITAL_COMMUNITY)
Admission: RE | Admit: 2022-11-23 | Discharge: 2022-11-23 | Disposition: A | Discharge status: Home / Self Care | Payer: Medicare Other | Source: Ambulatory Visit | Attending: Cardiology | Admitting: Cardiology

## 2022-11-23 ENCOUNTER — Encounter (HOSPITAL_COMMUNITY)
Admission: RE | Disposition: A | Discharge status: Home / Self Care | Payer: Self-pay | Source: Ambulatory Visit | Attending: Cardiology

## 2022-11-23 ENCOUNTER — Other Ambulatory Visit: Payer: Self-pay

## 2022-11-23 ENCOUNTER — Encounter (HOSPITAL_COMMUNITY): Payer: Self-pay | Admitting: Cardiology

## 2022-11-23 DIAGNOSIS — I2582 Chronic total occlusion of coronary artery: Secondary | ICD-10-CM | POA: Insufficient documentation

## 2022-11-23 DIAGNOSIS — I130 Hypertensive heart and chronic kidney disease with heart failure and stage 1 through stage 4 chronic kidney disease, or unspecified chronic kidney disease: Secondary | ICD-10-CM | POA: Diagnosis not present

## 2022-11-23 DIAGNOSIS — Z87891 Personal history of nicotine dependence: Secondary | ICD-10-CM | POA: Insufficient documentation

## 2022-11-23 DIAGNOSIS — I25118 Atherosclerotic heart disease of native coronary artery with other forms of angina pectoris: Secondary | ICD-10-CM | POA: Diagnosis present

## 2022-11-23 DIAGNOSIS — N183 Chronic kidney disease, stage 3 unspecified: Secondary | ICD-10-CM | POA: Diagnosis not present

## 2022-11-23 DIAGNOSIS — I25119 Atherosclerotic heart disease of native coronary artery with unspecified angina pectoris: Secondary | ICD-10-CM | POA: Insufficient documentation

## 2022-11-23 DIAGNOSIS — Z955 Presence of coronary angioplasty implant and graft: Secondary | ICD-10-CM | POA: Insufficient documentation

## 2022-11-23 DIAGNOSIS — E785 Hyperlipidemia, unspecified: Secondary | ICD-10-CM | POA: Insufficient documentation

## 2022-11-23 DIAGNOSIS — I447 Left bundle-branch block, unspecified: Secondary | ICD-10-CM | POA: Insufficient documentation

## 2022-11-23 DIAGNOSIS — I255 Ischemic cardiomyopathy: Secondary | ICD-10-CM | POA: Diagnosis not present

## 2022-11-23 DIAGNOSIS — I5022 Chronic systolic (congestive) heart failure: Secondary | ICD-10-CM | POA: Insufficient documentation

## 2022-11-23 DIAGNOSIS — Z8249 Family history of ischemic heart disease and other diseases of the circulatory system: Secondary | ICD-10-CM | POA: Diagnosis not present

## 2022-11-23 DIAGNOSIS — I2510 Atherosclerotic heart disease of native coronary artery without angina pectoris: Secondary | ICD-10-CM | POA: Diagnosis not present

## 2022-11-23 HISTORY — PX: LEFT HEART CATH AND CORONARY ANGIOGRAPHY: CATH118249

## 2022-11-23 SURGERY — LEFT HEART CATH AND CORONARY ANGIOGRAPHY
Anesthesia: LOCAL

## 2022-11-23 MED ORDER — HEPARIN SODIUM (PORCINE) 1000 UNIT/ML IJ SOLN
INTRAMUSCULAR | Status: DC | PRN
  Administered 2022-11-23: 5000 [IU] via INTRAVENOUS
  Administered 2022-11-23: 4000 [IU] via INTRAVENOUS

## 2022-11-23 MED ORDER — FENTANYL CITRATE (PF) 100 MCG/2ML IJ SOLN
INTRAMUSCULAR | Status: DC | PRN
  Administered 2022-11-23: 50 ug via INTRAVENOUS

## 2022-11-23 MED ORDER — ONDANSETRON HCL 4 MG/2ML IJ SOLN: 4.0000 mg | Freq: Four times a day (QID) | INTRAMUSCULAR | Status: DC | PRN

## 2022-11-23 MED ORDER — IOHEXOL 350 MG/ML SOLN
INTRAVENOUS | Status: DC | PRN
  Administered 2022-11-23: 65 mL

## 2022-11-23 MED ORDER — SODIUM CHLORIDE 0.9% FLUSH: 3.0000 mL | INTRAVENOUS | Status: DC | PRN

## 2022-11-23 MED ORDER — MIDAZOLAM HCL 2 MG/2ML IJ SOLN
INTRAMUSCULAR | Status: DC | PRN
  Administered 2022-11-23: 2 mg via INTRAVENOUS

## 2022-11-23 MED ORDER — ACETAMINOPHEN 325 MG PO TABS: 650.0000 mg | ORAL_TABLET | ORAL | Status: DC | PRN

## 2022-11-23 MED ORDER — SODIUM CHLORIDE 0.9% FLUSH: 3.0000 mL | Freq: Two times a day (BID) | INTRAVENOUS | Status: DC

## 2022-11-23 MED ORDER — ASPIRIN 81 MG PO CHEW: 81.0000 mg | CHEWABLE_TABLET | ORAL | Status: DC

## 2022-11-23 MED ORDER — HEPARIN SODIUM (PORCINE) 1000 UNIT/ML IJ SOLN
INTRAMUSCULAR | Status: AC
  Filled 2022-11-23: qty 10

## 2022-11-23 MED ORDER — HEPARIN (PORCINE) IN NACL 1000-0.9 UT/500ML-% IV SOLN
INTRAVENOUS | Status: DC | PRN
  Administered 2022-11-23 (×2): 500 mL

## 2022-11-23 MED ORDER — LIDOCAINE HCL (PF) 1 % IJ SOLN
INTRAMUSCULAR | Status: DC | PRN
  Administered 2022-11-23: 2 mL

## 2022-11-23 MED ORDER — ASPIRIN 81 MG PO CHEW
81.0000 mg | CHEWABLE_TABLET | ORAL | Status: AC
  Administered 2022-11-23: 81 mg via ORAL
  Filled 2022-11-23: qty 1

## 2022-11-23 MED ORDER — SODIUM CHLORIDE 0.9 % WEIGHT BASED INFUSION
3.0000 mL/kg/h | INTRAVENOUS | Status: AC
  Administered 2022-11-23: 3 mL/kg/h via INTRAVENOUS

## 2022-11-23 MED ORDER — FENTANYL CITRATE (PF) 100 MCG/2ML IJ SOLN
INTRAMUSCULAR | Status: AC
  Filled 2022-11-23: qty 2

## 2022-11-23 MED ORDER — SODIUM CHLORIDE 0.9 % WEIGHT BASED INFUSION: 1.0000 mL/kg/h | INTRAVENOUS | Status: DC

## 2022-11-23 MED ORDER — HEPARIN (PORCINE) IN NACL 1000-0.9 UT/500ML-% IV SOLN
INTRAVENOUS | Status: AC
  Filled 2022-11-23: qty 1000

## 2022-11-23 MED ORDER — SODIUM CHLORIDE 0.9 % WEIGHT BASED INFUSION: 3.0000 mL/kg/h | INTRAVENOUS | Status: DC

## 2022-11-23 MED ORDER — VERAPAMIL HCL 2.5 MG/ML IV SOLN
INTRAVENOUS | Status: DC | PRN
  Administered 2022-11-23: 10 mL via INTRA_ARTERIAL

## 2022-11-23 MED ORDER — VERAPAMIL HCL 2.5 MG/ML IV SOLN
INTRAVENOUS | Status: AC
  Filled 2022-11-23: qty 2

## 2022-11-23 MED ORDER — MIDAZOLAM HCL 2 MG/2ML IJ SOLN
INTRAMUSCULAR | Status: AC
  Filled 2022-11-23: qty 2

## 2022-11-23 MED ORDER — LIDOCAINE HCL (PF) 1 % IJ SOLN
INTRAMUSCULAR | Status: AC
  Filled 2022-11-23: qty 30

## 2022-11-23 MED ORDER — SODIUM CHLORIDE 0.9 % IV SOLN: 250.0000 mL | INTRAVENOUS | Status: DC | PRN

## 2022-11-23 SURGICAL SUPPLY — 10 items
CATH INFINITI 5FR JK (CATHETERS) IMPLANT
DEVICE RAD COMP TR BAND LRG (VASCULAR PRODUCTS) IMPLANT
ELECT DEFIB PAD ADLT CADENCE (PAD) IMPLANT
GLIDESHEATH SLEND A-KIT 6F 22G (SHEATH) IMPLANT
GUIDEWIRE INQWIRE 1.5J.035X260 (WIRE) IMPLANT
INQWIRE 1.5J .035X260CM (WIRE) ×1
KIT HEART LEFT (KITS) ×2 IMPLANT
PACK CARDIAC CATHETERIZATION (CUSTOM PROCEDURE TRAY) ×2 IMPLANT
TRANSDUCER W/STOPCOCK (MISCELLANEOUS) ×2 IMPLANT
TUBING CIL FLEX 10 FLL-RA (TUBING) ×2 IMPLANT

## 2022-11-23 NOTE — Progress Notes (Signed)
Discharge instructions given verbally and in writing to pt and wife.  Both verbalize understanding and deny further questions.  (R) radial site unremarkable and WNL.  VSS

## 2022-11-23 NOTE — Progress Notes (Signed)
EKG critical alert. MD aware.

## 2022-11-23 NOTE — Interval H&P Note (Signed)
History and Physical Interval Note:  11/23/2022 10:06 AM  Franklin Jones  has presented today for surgery, with the diagnosis of CAD.  The various methods of treatment have been discussed with the patient and family. After consideration of risks, benefits and other options for treatment, the patient has consented to  Procedure(s): LEFT HEART CATH AND CORONARY ANGIOGRAPHY (N/A) as a surgical intervention.  The patient's history has been reviewed, patient examined, no change in status, stable for surgery.  I have reviewed the patient's chart and labs.  Questions were answered to the patient's satisfaction.    Cath Lab Visit (complete for each Cath Lab visit)  Clinical Evaluation Leading to the Procedure:   ACS: No.  Non-ACS:    Anginal Classification: CCS II  Anti-ischemic medical therapy: Maximal Therapy (2 or more classes of medications)  Non-Invasive Test Results: High-risk stress test findings: cardiac mortality >3%/year  Prior CABG: No previous CABG   Adrian Prows

## 2022-12-23 ENCOUNTER — Ambulatory Visit: Payer: Medicare Other | Admitting: Cardiology

## 2022-12-23 ENCOUNTER — Encounter: Payer: Self-pay | Admitting: Cardiology

## 2022-12-23 VITALS — BP 130/76 | HR 72 | Resp 16 | Ht 74.0 in | Wt 223.0 lb

## 2022-12-23 DIAGNOSIS — I447 Left bundle-branch block, unspecified: Secondary | ICD-10-CM

## 2022-12-23 DIAGNOSIS — I25118 Atherosclerotic heart disease of native coronary artery with other forms of angina pectoris: Secondary | ICD-10-CM | POA: Diagnosis not present

## 2022-12-23 DIAGNOSIS — I5022 Chronic systolic (congestive) heart failure: Secondary | ICD-10-CM

## 2022-12-23 NOTE — Progress Notes (Signed)
Primary Physician/Referring:  Glenda Chroman, MD  Patient ID: Franklin Jones, male    DOB: [birth year], 72 y.o.   MRN: [id number]  Chief Complaint  Patient presents with   Coronary Artery Disease   Follow-up    6 week   HPI:    Franklin Jones  is a 72 y.o. Caucasian male  with hypertension, stage III chronic kidney disease, hyperlipidemia, coronary artery disease, complete occlusion of proximal LAD to mid LAD and acute marginal of RCA by coronary angiography on 12/28/2017 at which time his EF was 20-25% and initially presented with florid CHF. He is S/P Mid LAD PCI in June 2019 with improvement in EF to 45%. However recent echocardiogram in January 24 revealed EF 30 to 35%, and had developed new left bundle branch block, underwent cardiac catheterization on 11/23/2022 revealing widely patent LAD stent.  His anginal symptoms of burning sensation "gas" of the chest with exertional activities and he states he continue to have these if he exerts heavily and he burps and feels better and continues his activity. Dyspnea has remained stable.  No PND or orthopnea.  No change in symptoms, he has not had any complications from cardiac catheterization.  Past Medical History:  Diagnosis Date   Acute on chronic combined systolic and diastolic CHF (congestive heart failure) ([place]) 12/15/2017   Acute on chronic heart failure ([place]) 12/15/2017   Allergic rhinitis    Arthritis    "qwhere" (6//19/2019)   Asbestosis(501)    followed by Dr Barkley Boards- "on monitoring only; stable x years" (04/05/2018)   Basal cell carcinoma    "face; head" (04/05/2018)   Degenerative arthritis of knee 11/19/2011   Dyspnea on exertion 02/01/2019   GERD (gastroesophageal reflux disease)    High cholesterol    History of asbestos exposure    followed by Dr West Carbo- states stable years- last CT chest 7/12 in Epic   Hypertension    LBBB (left bundle branch block) 01/31/2018   Shortness of breath    SOME SOB BUT CAN CLIMB FLIGHT OF  STAIRS   Squamous carcinoma    "back of neck" (04/05/2018)   Past Surgical History:  Procedure Laterality Date   CATARACT EXTRACTION, BILATERAL Bilateral 2020   COLONOSCOPY     COLONOSCOPY N/A 08/22/2014   Procedure: COLONOSCOPY;  Surgeon: Rogene Houston, MD;  [facility]: [facility];  Service: Endoscopy;  Laterality: N/A;  44   CORONARY ANGIOPLASTY WITH STENT PLACEMENT  04/05/2018   CTO PCI of the mid LAD with DES x 2.   CORONARY CTO INTERVENTION N/A 04/05/2018   Procedure: CORONARY CTO INTERVENTION;  Surgeon: Martinique, Peter M, MD;  Location: [facility];  Service: Cardiovascular;  Laterality: N/A;   FINGER AMPUTATION Left 2009   traumatic injury left small finger with amputation   JOINT REPLACEMENT     KNEE ARTHROPLASTY  11/19/2011   Procedure: COMPUTER ASSISTED TOTAL KNEE ARTHROPLASTY;  Surgeon: Mcarthur Rossetti, MD;  Location: [facility];  Service: Orthopedics;  Laterality: Right;  Right Total Knee Arthroplasty   LEFT HEART CATH AND CORONARY ANGIOGRAPHY N/A 11/23/2022   Procedure: LEFT HEART CATH AND CORONARY ANGIOGRAPHY;  Surgeon: Adrian Prows, MD;  Location: [facility];  Service: Cardiovascular;  Laterality: N/A;   MASS EXCISION Left 03/12/2014   Procedure: EXCISION LEFT NECK SKIN MASS;  Surgeon: Gayland Curry, MD;  Location: [facility];  Service: General;  Laterality: Left;   RIGHT/LEFT HEART CATH AND  CORONARY ANGIOGRAPHY N/A 12/27/2017   Procedure: RIGHT/LEFT HEART CATH AND CORONARY ANGIOGRAPHY;  Surgeon: Adrian Prows, MD;  Location: [facility];  Service: Cardiovascular;  Laterality: N/A;   TOTAL KNEE ARTHROPLASTY  08/18/2012   Procedure: TOTAL KNEE ARTHROPLASTY;  Surgeon: Mcarthur Rossetti, MD;  Location: [facility];  Service: Orthopedics;  Laterality: Left;  Left Total Knee Arthroplasty   ULTRASOUND GUIDANCE FOR VASCULAR ACCESS  04/05/2018   Procedure: Ultrasound Guidance For Vascular Access;  Surgeon: Martinique, Peter M, MD;  Location: [facility];  Service: Cardiovascular;;   Family History  Problem Relation Age of Onset   Allergies Mother    Heart disease Father    Lung cancer Brother    Heart failure Brother    Social History   Tobacco Use   Smoking status: Former    Packs/day: 1.00    Years: 20.00    Total pack years: 20.00    Types: Cigarettes    Quit date: 10/18/1985    Years since quitting: 37.2   Smokeless tobacco: Never  Substance Use Topics   Alcohol use: Yes    Alcohol/week: 9.0 standard drinks of alcohol    Types: 3 Cans of beer, 6 Standard drinks or equivalent per week    Comment: OCC   Marital Status: Married  ROS  Review of Systems  Cardiovascular:  Positive for chest pain ("Gas") and dyspnea on exertion (stable). Negative for leg swelling and palpitations.  All other systems reviewed and are negative.  Objective      12/23/2022    1:42 PM 11/23/2022    1:25 PM 11/23/2022   12:40 PM  Vitals with BMI  Height '6\' 2"'$     Weight 223 lbs    BMI 0000000    Systolic [id number]  Diastolic 76 68 65  Pulse 72 64 62    Blood pressure 130/76, pulse 72, resp. rate 16, height '6\' 2"'$  (1.88 m), weight 223 lb (101.2 kg), SpO2 98 %. Body mass index is 28.63 kg/m.      12/23/2022    1:42 PM 11/23/2022    1:25 PM 11/23/2022   12:40 PM  Vitals with BMI  Height '6\' 2"'$     Weight 223 lbs    BMI 0000000    Systolic [id number]  Diastolic 76 68 65  Pulse 72 64 62     Physical Exam Neck:     Vascular: No carotid bruit or JVD.  Cardiovascular:     Rate and Rhythm: Normal rate and regular rhythm.     Pulses: Normal pulses and intact distal pulses.     Heart sounds: Normal heart sounds. No murmur heard.    No gallop.  Pulmonary:     Effort: Pulmonary effort is normal.     Breath sounds: Normal breath sounds.  Abdominal:     General: Bowel sounds are normal.     Palpations: Abdomen is soft.  Musculoskeletal:     Right lower leg: No edema.     Left lower leg: No edema.  Skin:    General: Skin is warm.     Laboratory examination:   External labs   Labs Cholesterol, total 109.000 m 09/01/2022 HDL 42.000 mg 09/01/2022 LDL 50.000 mg 09/01/2022 Triglycerides 83.000 mg 09/01/2022  Hemoglobin 14.900 g/d 09/01/2022 Platelets 185.000 x1 09/01/2022  Creatinine, Serum 1.540 mg/ 09/01/2022 Potassium 4.700 mm 09/01/2022 ALT (SGPT) 18.000 IU/ 09/01/2022  TSH 3.980 09/01/2022  Hemoglobin 15.500 G/ 08/25/2021 Platelets 195.000 X1  08/25/2021  Creatinine, Serum 1.390 MG/ 08/25/2021 ALT (SGPT) 16.000 IU/ 08/25/2021 eGFR 46  TSH 4.090 08/25/2021   Medications and allergies   Allergies  Allergen Reactions   Penicillins Rash    Has patient had a PCN reaction causing immediate rash, facial/tongue/throat swelling, SOB or lightheadedness with hypotension: unkn Has patient had a PCN reaction causing severe rash involving mucus membranes or skin necrosis: unkn Has patient had a PCN reaction that required hospitalization: no Has patient had a PCN reaction occurring within the last 10 years: no If all of the above answers are "NO", then may proceed with Cephalosporin use.      Current Outpatient Medications:    aspirin EC 81 MG tablet, Take 1 tablet (81 mg total) by mouth daily. (Patient taking differently: Take 81 mg by mouth every evening.), Disp: 90 tablet, Rfl: 3   carvedilol (COREG) 12.5 MG tablet, TAKE (1) TABLET TWICE DAILY., Disp: 180 tablet, Rfl: 0   fexofenadine (ALLEGRA) 180 MG tablet, Take 180 mg by mouth in the morning., Disp: , Rfl:    losartan (COZAAR) 25 MG tablet, TAKE 1 TABLET BY MOUTH EVERY MORNING. (Patient taking differently: Take 25 mg by mouth every evening.), Disp: 90 tablet, Rfl: 0   Multiple Vitamins-Minerals (CENTRUM SILVER 50+MEN) TABS, Take 1 tablet by mouth in the morning., Disp: , Rfl:    nitroGLYCERIN (NITROSTAT) 0.4 MG SL tablet, PLACE (1) TABLET UNDER TONGUE EVERY 5 MINUTES UP TO (3) DOSES. IF NO RELIEF CALL 911., Disp: 25 tablet, Rfl: 0   pantoprazole (PROTONIX)  40 MG tablet, Take 40 mg by mouth daily as needed (indigestion/heartburn.)., Disp: , Rfl:    rosuvastatin (CRESTOR) 10 MG tablet, TAKE 1 TABLET ONCE DAILY. (Patient taking differently: Take 10 mg by mouth at bedtime.), Disp: 90 tablet, Rfl: 0   sildenafil (VIAGRA) 100 MG tablet, Take 100 mg by mouth daily as needed for erectile dysfunction., Disp: , Rfl:    VENTOLIN HFA 108 (90 Base) MCG/ACT inhaler, INHALE 2 PUFFS EVERY 4 HOURS AS NEEDED FOR WHEEZING OR SHORTNESS OF BREATH., Disp: 18 g, Rfl: 3   Radiology:   CT chest 07/03/2021 without contrast: 1. Cardiovascular: Heart size is normal. There is no significant pericardial fluid, thickening or pericardial calcification. There is aortic atherosclerosis, as well as atherosclerosis of the great vessels of the mediastinum and the coronary arteries, including calcified atherosclerotic plaque in the left main, left anterior descending, left circumflex and right coronary arteries. 2. No calcified pleural plaques to suggest asbestos related pleural disease. Subtle changes in the lungs which could be indicative of early or mild interstitial lung disease.  Cardiac Studies:   PCV MYOCARDIAL PERFUSION WITH LEXISCAN 10/26/2022  Narrative Regadenoson Nuclear stress test 10/25/2022: Nondiagnostic ECG stress. Underlying LBBB.  The heart rate response was consistent with Regadenoson. There is a large fixed defect with no uptake in  Mid to distal anterior, septal wall and apical wall without reversibility. Overall LV systolic function is abnormal with global hypokinesis and apical akinesis. Stress LV EF: 37%. Compared to the study done on 12/17/2019, no significant change.  Previous LVEF was calculated at 44%. High risk study.  PCV ECHOCARDIOGRAM COMPLETE 10/26/2022  Narrative Echocardiogram 10/26/2022: Moderately depressed LV systolic function with visual EF 30-35%. Left ventricle cavity is normal in size. Mild hypertrophy of the left ventricle. Doppler  evidence of grade I (impaired) diastolic dysfunction, normal LAP. Global and regional wall motion abnormalities: Basal anteroseptal, Basal anterior, Basal inferior, Basal inferoseptal, Mid anteroseptal, Mid anterior, Mid inferior,  Mid inferoseptal, Apical lateral, Apical septal and Apical cap hypokinesis. Right ventricle cavity is normal in size. Low normal right ventricular function. Mild (Grade I) mitral regurgitation. Mild tricuspid regurgitation. No evidence of pulmonary hypertension. RVSP measures 30 mmHg. Compared to 01/027/2021 LVEF reduced from 45% to 30-35% w/ global and regional wall motion abnormalities, Mild MR and TR are new, otherwise no significant change.   Left Heart Catheterization 11/23/22:  LV 109/0, EDP 14 mmHg.  Ao 103/67, mean 72 mmHg.  No pressure gradient across aortic valve. LM: Large-caliber vessel, smooth and normal. LAD: Large-caliber vessel.  The previously placed overlapping 2.5 x 2402.25 x 38 mm Synergy DES on 04/05/2018 are widely patent.  Mild disease is noted of the native LAD. LCx: Large-caliber vessel, smooth and normal. RCA: Large-caliber vessel, it is occluded after the origin of RV branch.  There is ipsilateral collaterals from the conus branch to the distal right and also from the LAD to the distal right.  Short segment occlusion.  Unchanged from prior cardiac catheterization, but the collaterals appear to be more robust.      Impression: Patient stress test abnormality in the anterior wall is probably related to underlying LBBB.  As the LAD stent is widely patent, symptoms of angina are very minimal class II, has robust collaterals to the distal right coronary artery, although the mid RCA stenosis appears to be conducive for angioplasty, will continue medical therapy.  EKG   EKG: 09/27/22: Normal sinus rhythm at rate of 63 bpm, left atrial enlargement, left bundle branch block.  Compared to 03/25/2022, left bundle branch block is new.  EKG 03/25/2022: Sinus  bradycardia at the rate of 56 bpm with borderline first-degree block, PR interval 200 ms, normal axis, no evidence of ischemia, normal EKG.  No significant change from 03/19/2021. Compared to 04/27/2018, LBBB not present.  Assessment     ICD-10-CM   1. Coronary artery disease of native artery of native heart with stable angina pectoris ([place])  I25.118 PCV ECHOCARDIOGRAM COMPLETE    2. LBBB (left bundle branch block)  I44.7 PCV ECHOCARDIOGRAM COMPLETE    3. Chronic systolic heart failure (HCC)  I50.22 PCV ECHOCARDIOGRAM COMPLETE       Recommendations:   LEIGHTON RODNEY  is a 72 y.o. Caucasian male  with hypertension, stage III chronic kidney disease, hyperlipidemia, coronary artery disease, complete occlusion of proximal LAD to mid LAD and acute marginal of RCA by coronary angiography on 12/28/2017 at which time his EF was 20-25% and initially presented with florid CHF. He is S/P Mid LAD PCI in June 2019 with improvement in EF to 45%. However recent echocardiogram in January 24 revealed EF 30 to 35%, and had developed new left bundle branch block, underwent cardiac catheterization on 11/23/2022 revealing widely patent LAD stent.  1. Coronary artery disease of native artery of native heart with stable angina pectoris ([place]) I reviewed the results of the cardiac catheterization with the patient, do not think CTO RCA will explain his reduced LVEF, he has new onset left bundle branch block and I suspect this is the etiology.  Fortunately the LAD stent is widely patent.  I reassured him.  He does have chronic stable angina pectoris in the form of burning sensation in the chest if he were to really exert himself but resolved immediately.  Unless the symptoms are getting worse, I could certainly attempt CTO RCA which appears to be amenable for angioplasty.  2. LBBB (left bundle branch block) I will recheck his  LV function in 6 months, if LV function continues to deteriorate or if he develops any symptoms of  dyspnea or heart failure, he would be an excellent candidate for CRT-P/CRT-D.  3. Chronic systolic heart failure (HCC) No clinical heart failure, will continue monitoring for now.   Adrian Prows, MD, [facility] 12/23/2022, 2:31 PM Office: [phone] Fax: [phone] Pager: [phone]

## 2022-12-31 ENCOUNTER — Other Ambulatory Visit: Payer: Self-pay | Admitting: Cardiology

## 2022-12-31 DIAGNOSIS — I5032 Chronic diastolic (congestive) heart failure: Secondary | ICD-10-CM

## 2023-01-04 ENCOUNTER — Other Ambulatory Visit: Payer: Self-pay | Admitting: Cardiology

## 2023-01-04 DIAGNOSIS — I5032 Chronic diastolic (congestive) heart failure: Secondary | ICD-10-CM

## 2023-01-13 ENCOUNTER — Other Ambulatory Visit: Payer: Self-pay | Admitting: Cardiology

## 2023-02-11 ENCOUNTER — Other Ambulatory Visit: Payer: Self-pay | Admitting: Cardiology

## 2023-05-18 ENCOUNTER — Other Ambulatory Visit: Payer: Self-pay | Admitting: Cardiology

## 2023-05-18 DIAGNOSIS — I25110 Atherosclerotic heart disease of native coronary artery with unstable angina pectoris: Secondary | ICD-10-CM

## 2023-06-08 ENCOUNTER — Other Ambulatory Visit: Payer: Self-pay | Admitting: Cardiology

## 2023-06-27 DIAGNOSIS — Z23 Encounter for immunization: Secondary | ICD-10-CM | POA: Diagnosis not present

## 2023-07-05 ENCOUNTER — Ambulatory Visit (HOSPITAL_COMMUNITY)
Admission: RE | Admit: 2023-07-05 | Discharge: 2023-07-05 | Disposition: A | Discharge status: Home / Self Care | Payer: Medicare Other | Source: Ambulatory Visit | Attending: Emergency Medicine | Admitting: Emergency Medicine

## 2023-07-05 DIAGNOSIS — Z77090 Contact with and (suspected) exposure to asbestos: Secondary | ICD-10-CM

## 2023-07-05 DIAGNOSIS — J668 Airway disease due to other specific organic dusts: Secondary | ICD-10-CM | POA: Diagnosis not present

## 2023-07-05 DIAGNOSIS — R590 Localized enlarged lymph nodes: Secondary | ICD-10-CM | POA: Diagnosis not present

## 2023-07-05 DIAGNOSIS — R918 Other nonspecific abnormal finding of lung field: Secondary | ICD-10-CM | POA: Diagnosis not present

## 2023-08-08 ENCOUNTER — Other Ambulatory Visit: Payer: Medicare Other

## 2023-08-08 ENCOUNTER — Ambulatory Visit (HOSPITAL_COMMUNITY): Payer: Medicare Other | Attending: Cardiology

## 2023-08-08 DIAGNOSIS — I447 Left bundle-branch block, unspecified: Secondary | ICD-10-CM

## 2023-08-08 DIAGNOSIS — I25118 Atherosclerotic heart disease of native coronary artery with other forms of angina pectoris: Secondary | ICD-10-CM | POA: Diagnosis not present

## 2023-08-08 DIAGNOSIS — I5022 Chronic systolic (congestive) heart failure: Secondary | ICD-10-CM | POA: Diagnosis not present

## 2023-08-08 LAB — ECHOCARDIOGRAM COMPLETE
Area-P 1/2: 2.69 cm2
Calc EF: 39.8 %
Est EF: 25
S' Lateral: 4.5 cm
Single Plane A2C EF: 25.2 %
Single Plane A4C EF: 44.1 %

## 2023-08-08 MED ORDER — PERFLUTREN LIPID MICROSPHERE
1.0000 mL | INTRAVENOUS | Status: AC | PRN
  Administered 2023-08-08: 2 mL via INTRAVENOUS

## 2023-08-08 NOTE — Progress Notes (Signed)
Echocardiogram 08/08/2023:  1. Left ventricular ejection fraction, by estimation, is 25%. The left ventricle has severely decreased function. The left ventricle demonstrates global hypokinesis with septal-lateral dyssynchrony consistent with LBBB and small apical aneurysm. There  was no LV thrombus visualized. The left ventricular internal cavity size was mildly dilated. Left ventricular diastolic parameters are consistent with Grade I diastolic dysfunction (impaired relaxation).  2. Right ventricular systolic function is mildly reduced. The right ventricular size is normal. Tricuspid regurgitation signal is inadequate for assessing PA pressure.  3. The aortic valve is tricuspid. Aortic valve regurgitation is not visualized. No aortic stenosis is present.  4. The mitral valve is normal in structure. No evidence of mitral valve regurgitation. No evidence of mitral stenosis.  5. Pulmonic valve regurgitation is moderate.  6. The inferior vena cava is normal in size with greater than 50% respiratory variability, suggesting right atrial pressure of 3 mmHg. Compared to 10/27/2022, no significant change.  Patient is presently on beta-blocker therapy and also losartan, unable to use Entresto in view of low blood pressure and renal insufficiency.

## 2023-08-16 ENCOUNTER — Other Ambulatory Visit: Payer: Self-pay | Admitting: Cardiology

## 2023-08-16 DIAGNOSIS — I25110 Atherosclerotic heart disease of native coronary artery with unstable angina pectoris: Secondary | ICD-10-CM

## 2023-08-17 ENCOUNTER — Ambulatory Visit: Payer: Medicare Other | Admitting: Cardiology

## 2023-08-22 DIAGNOSIS — Z1331 Encounter for screening for depression: Secondary | ICD-10-CM | POA: Diagnosis not present

## 2023-08-22 DIAGNOSIS — E7800 Pure hypercholesterolemia, unspecified: Secondary | ICD-10-CM | POA: Diagnosis not present

## 2023-08-22 DIAGNOSIS — I700 Atherosclerosis of aorta: Secondary | ICD-10-CM | POA: Diagnosis not present

## 2023-08-22 DIAGNOSIS — Z7189 Other specified counseling: Secondary | ICD-10-CM | POA: Diagnosis not present

## 2023-08-22 DIAGNOSIS — Z0000 Encounter for general adult medical examination without abnormal findings: Secondary | ICD-10-CM | POA: Diagnosis not present

## 2023-08-22 DIAGNOSIS — Z79899 Other long term (current) drug therapy: Secondary | ICD-10-CM | POA: Diagnosis not present

## 2023-08-22 DIAGNOSIS — Z299 Encounter for prophylactic measures, unspecified: Secondary | ICD-10-CM | POA: Diagnosis not present

## 2023-08-22 DIAGNOSIS — Z125 Encounter for screening for malignant neoplasm of prostate: Secondary | ICD-10-CM | POA: Diagnosis not present

## 2023-08-22 DIAGNOSIS — Z1339 Encounter for screening examination for other mental health and behavioral disorders: Secondary | ICD-10-CM | POA: Diagnosis not present

## 2023-08-22 DIAGNOSIS — I10 Essential (primary) hypertension: Secondary | ICD-10-CM | POA: Diagnosis not present

## 2023-08-22 DIAGNOSIS — R5383 Other fatigue: Secondary | ICD-10-CM | POA: Diagnosis not present

## 2023-09-10 ENCOUNTER — Encounter: Payer: Self-pay | Admitting: Cardiology

## 2023-09-10 NOTE — Progress Notes (Unsigned)
Cardiology Office Note:  .   Date:  09/12/2023  ID:  Dalia Heading, DOB [birth year], MRN [id number] PCP: Ignatius Specking, MD  [facility] Providers Cardiologist:  Yates Decamp, MD   History of Present Illness: Franklin Kitchen   Franklin Jones is a 72 y.o. Caucasian male  with hypertension, stage III chronic kidney disease, hyperlipidemia, coronary artery disease, complete occlusion of proximal LAD to mid LAD and acute marginal of RCA by coronary angiography on 12/28/2017 at which time his EF was 20-25% and initially presented with florid CHF. He is S/P Mid LAD PCI in June 2019 with improvement in EF to 45%. However recent echocardiogram in January 24 revealed EF 30 to 35%, and had developed new left bundle branch block, underwent cardiac catheterization on 11/23/2022 revealing widely patent LAD stent.   Discussed the use of AI scribe software for clinical note transcription with the patient, who gave verbal consent to proceed.  History of Present Illness   The patient, with a history of heart disease and mild kidney disease, presents to discuss the results of his recent echocardiogram. The echocardiogram revealed a low heart function of 25-30%, attributed to a left bundle branch block. Despite being on medication for his heart condition, the patient's heart function remains low. The patient reports feeling good overall, with no leg swelling, dizziness, or shortness of breath. He notes that he only experiences shortness of breath with exertion and that he tires easily when doing activities. However, he believes his energy levels have returned to baseline. The patient's kidney disease has been stable and has not worsened over time.      Review of Systems  Cardiovascular:  Negative for chest pain, dyspnea on exertion and leg swelling.    Labs   Lab Results  Component Value Date   CHOL 106 08/16/2019   HDL 43 08/16/2019   LDLCALC 47 08/16/2019   LDLDIRECT 55 08/16/2019   TRIG 78 08/16/2019   Lab Results   Component Value Date   NA 139 11/16/2022   K 4.5 11/16/2022   CO2 22 11/16/2022   GLUCOSE 87 11/16/2022   BUN 24 11/16/2022   CREATININE 1.19 11/16/2022   CALCIUM 9.4 11/16/2022   EGFR 65 11/16/2022   GFRNONAA 46 (L) 04/03/2020      Latest Ref Rng & Units 11/16/2022    3:17 PM 04/03/2020    8:48 AM 08/16/2019    8:50 AM  BMP  Glucose 70 - 99 mg/dL 87  630  160   BUN 8 - 27 mg/dL 24  21  25    Creatinine 0.76 - 1.27 mg/dL 1.09  3.23  5.57   BUN/Creat Ratio 10 - 24 20  14  16    Sodium 134 - 144 mmol/L 139  140  140   Potassium 3.5 - 5.2 mmol/L 4.5  4.4  4.9   Chloride 96 - 106 mmol/L 103  106  105   CO2 20 - 29 mmol/L 22  22  24    Calcium 8.6 - 10.2 mg/dL 9.4  9.3  9.7        Latest Ref Rng & Units 11/16/2022    3:17 PM 01/25/2019    8:16 AM 04/06/2018    2:42 AM  CBC  WBC 3.4 - 10.8 x10E3/uL 6.8  6.1  7.0   Hemoglobin 13.0 - 17.7 g/dL 32.2  02.5  42.7   Hematocrit 37.5 - 51.0 % 42.2  41.8  36.5   Platelets 150 - 450  x10E3/uL 195  188  151     External Labs:  Labs 08/23/2023:  Total cholesterol 117, triglycerides 87, HDL 44, LDL 56.  BUN 25, creatinine 1.3, EGFR 58 mL, potassium 4.8, LFTs normal.  Labs 09/02/2022:  TSH normal at 3.98.  Physical Exam:   VS:  BP 138/78 (BP Location: Left Arm, Patient Position: Sitting, Cuff Size: Normal)   Pulse 65   Resp 16   Ht 6\' 2"  (1.88 m)   Wt 212 lb 6.4 oz (96.3 kg)   SpO2 96%   BMI 27.27 kg/m    Wt Readings from Last 3 Encounters:  09/12/23 212 lb 6.4 oz (96.3 kg)  12/23/22 223 lb (101.2 kg)  11/23/22 220 lb (99.8 kg)     Physical Exam Neck:     Vascular: No carotid bruit or JVD.  Cardiovascular:     Rate and Rhythm: Normal rate and regular rhythm.     Pulses: Intact distal pulses.     Heart sounds: Normal heart sounds. No murmur heard.    No gallop.  Pulmonary:     Effort: Pulmonary effort is normal.     Breath sounds: Normal breath sounds.  Abdominal:     General: Bowel sounds are normal.     Palpations:  Abdomen is soft.  Musculoskeletal:     Right lower leg: No edema.     Left lower leg: No edema.     Studies Reviewed: .    Echocardiogram 10/26/2022: Moderately depressed LV systolic function with visual EF 30-35%. Left ventricle cavity is normal in size. Mild hypertrophy of the left ventricle. Doppler evidence of grade I (impaired) diastolic dysfunction, normal LAP. Global and regional wall motion abnormalities: Basal anteroseptal, Basal anterior, Basal inferior, Basal inferoseptal, Mid anteroseptal, Mid anterior, Mid inferior, Mid inferoseptal, Apical lateral, Apical septal and Apical cap hypokinesis. Right ventricle cavity is normal in size. Low normal right ventricular function. Mild (Grade I) mitral regurgitation. Mild tricuspid regurgitation. No evidence of pulmonary hypertension. RVSP measures 30 mmHg. Compared to 01/027/2021 LVEF reduced from 45% to 30-35% w/ global and regional wall motion abnormalities, Mild MR and TR are new, otherwise no significant change.    Left Heart Catheterization 11/23/22:  LV 109/0, EDP 14 mmHg.  Ao 103/67, mean 72 mmHg.  No pressure gradient across aortic valve. LM: Large-caliber vessel, smooth and normal. LAD: Large-caliber vessel.  The previously placed overlapping 2.5 x 2402.25 x 38 mm Synergy DES on 04/05/2018 are widely patent.  Mild disease is noted of the native LAD. LCx: Large-caliber vessel, smooth and normal. RCA: Large-caliber vessel, it is occluded after the origin of RV branch.  There is ipsilateral collaterals from the conus branch to the distal right and also from the LAD to the distal right.  Short segment occlusion.  Unchanged from prior cardiac catheterization, but the collaterals appear to be more robust.      Impression: Patient stress test abnormality in the anterior wall is probably related to underlying LBBB.  As the LAD stent is widely patent, symptoms of angina are very minimal class II, has robust collaterals to the distal right  coronary artery, although the mid RCA stenosis appears to be conducive for angioplasty, will continue medical therapy.  ECHOCARDIOGRAM COMPLETE 08/08/2023  1. Left ventricular ejection fraction, by estimation, is 25%. The left ventricle has severely decreased function. The left ventricle demonstrates global hypokinesis with septal-lateral dyssynchrony consistent with LBBB and small apical aneurysm. There was no LV thrombus visualized. The left ventricular internal cavity  size was mildly dilated. Left ventricular diastolic parameters are consistent with Grade I diastolic dysfunction (impaired relaxation). 2. Right ventricular systolic function is mildly reduced. The right ventricular size is normal. Tricuspid regurgitation signal is inadequate for assessing PA pressure. 3. The aortic valve is tricuspid. Aortic valve regurgitation is not visualized. No aortic stenosis is present. 4. The mitral valve is normal in structure. No evidence of mitral valve regurgitation. No evidence of mitral stenosis. 5. Pulmonic valve regurgitation is moderate. 6. The inferior vena cava is normal in size with greater than 50% respiratory variability, suggesting right atrial pressure of 3 mmHg.  EKG:    EKG Interpretation Date/Time:  Monday September 12 2023 10:03:07 EST Ventricular Rate:  115 PR Interval:  240 QRS Duration:  176 QT Interval:  172 QTC Calculation: 237 R Axis:   29  Text Interpretation: EKG 09/12/2023: Normal sinus rhythm at rate of 60 bpm, left bundle branch block.  No further analysis.  Compared to 11/23/2022, no significant change. Confirmed by Delrae Rend [phone]) on 09/12/2023 10:18:04 AM    EKG: 09/27/22: Normal sinus rhythm at rate of 63 bpm, left atrial enlargement, left bundle branch block. Compared to 03/25/2022, left bundle branch block is new.   Medications and allergies    Allergies  Allergen Reactions   Penicillins Rash    Has patient had a PCN reaction causing immediate rash,  facial/tongue/throat swelling, SOB or lightheadedness with hypotension: unkn Has patient had a PCN reaction causing severe rash involving mucus membranes or skin necrosis: unkn Has patient had a PCN reaction that required hospitalization: no Has patient had a PCN reaction occurring within the last 10 years: no If all of the above answers are "NO", then may proceed with Cephalosporin use.     Current Meds  Medication Sig   aspirin EC 81 MG tablet Take 1 tablet (81 mg total) by mouth daily. (Patient taking differently: Take 81 mg by mouth every evening.)   carvedilol (COREG) 12.5 MG tablet take (1) tablet twice daily.   fexofenadine (ALLEGRA) 180 MG tablet Take 180 mg by mouth in the morning.   Multiple Vitamins-Minerals (CENTRUM SILVER 50+MEN) TABS Take 1 tablet by mouth in the morning.   nitroGLYCERIN (NITROSTAT) 0.4 MG SL tablet PLACE (1) TABLET UNDER TONGUE EVERY 5 MINUTES UP TO (3) DOSES. IF NO RELIEF CALL 911.   rosuvastatin (CRESTOR) 10 MG tablet take 1 tablet once daily.   sacubitril-valsartan (ENTRESTO) 24-26 MG Take 1 tablet by mouth 2 (two) times daily.   sildenafil (VIAGRA) 100 MG tablet Take 100 mg by mouth daily as needed for erectile dysfunction.   VENTOLIN HFA 108 (90 Base) MCG/ACT inhaler INHALE 2 PUFFS EVERY 4 HOURS AS NEEDED FOR WHEEZING OR SHORTNESS OF BREATH.   [DISCONTINUED] losartan (COZAAR) 25 MG tablet TAKE 1 TABLET BY MOUTH EVERY MORNING.     ASSESSMENT AND PLAN: .      ICD-10-CM   1. Coronary artery disease of native artery of native heart with stable angina pectoris (HCC)  I25.118 EKG 12-Lead    2. LBBB (left bundle branch block)  I44.7 Ambulatory referral to Cardiac Electrophysiology    3. Chronic systolic heart failure (HCC)  A21.30 Ambulatory referral to Cardiac Electrophysiology    Basic Metabolic Panel (BMET)    Pro b natriuretic peptide    sacubitril-valsartan (ENTRESTO) 24-26 MG    4. Non-ischemic cardiomyopathy (HCC)  I42.8 AMB Referral to Heartcare  Pharm-D    sacubitril-valsartan (ENTRESTO) 24-26 MG     1.  Coronary artery disease of native artery of native heart with stable angina pectoris [facility]) Patient from coronary disease standpoint remains stable.  He has not had any recurrence of angina pectoris recently and has not used any sublingual nitroglycerin. - EKG 12-Lead  2. LBBB (left bundle branch block) Continues to have persistent left bundle branch block.  No heart block, no syncope or near syncope. - Ambulatory referral to Cardiac Electrophysiology  3. Chronic systolic heart failure (HCC) Ejection fraction remains low at 25-30% despite optimal medical therapy. Patient is asymptomatic with no signs of heart failure on examination. Discussed the potential benefit of a pacemaker and the need for an informed decision.  Patient states that he is asymptomatic except for very mild dyspnea which is class II dyspnea, with no acute decompensated heart failure symptoms, who prefers to continue medical management however advised him that obtaining a EP consult would be appropriate/to get another opinion. -Refer to electrophysiology for further discussion and evaluation for pacemaker.  Currently on carvedilol and losartan. Discussed potential benefit of switching to Entresto to improve heart function. -Discontinue losartan and initiate Entresto at a low dose. Will hold off on starting spironolactone as he does have underlying stage IIIa chronic kidney disease. -Plan for titration of Entresto under supervision of pharmacy. -Order blood work including BMP and NT-BNP in 2 weeks to monitor kidney function and heart failure status.   -Consider repeating echocardiogram in 6 months. - Ambulatory referral to Cardiac Electrophysiology - Basic Metabolic Panel (BMET); Future - Pro b natriuretic peptide; Future  4. Non-ischemic cardiomyopathy (HCC) His LV systolic dysfunction is out of proportion to underlying CAD with occluded RCA which has  collaterals.  His LAD stent is widely patent by cardiac catheterization in February 2024.  - AMB Referral to Heartcare Pharm-D  Other orders - sacubitril-valsartan (ENTRESTO) 24-26 MG; Take 1 tablet by mouth 2 (two) times daily.  Dispense: 60 tablet; Refill: 3  I would like to see him back in 6 months for follow-up of his nonischemic cardiomyopathy and CAD.  Will repeat echocardiogram prior to his next office visit.   Signed,  Yates Decamp, MD, [facility] 09/12/2023, 11:23 AM [facility] [address] [place], [place] [address] Phone: [phone]. Fax:  [phone]

## 2023-09-12 ENCOUNTER — Other Ambulatory Visit: Payer: Self-pay | Admitting: *Deleted

## 2023-09-12 ENCOUNTER — Encounter: Payer: Self-pay | Admitting: Cardiology

## 2023-09-12 ENCOUNTER — Ambulatory Visit: Payer: Medicare Other | Attending: Cardiology | Admitting: Cardiology

## 2023-09-12 VITALS — BP 138/78 | HR 65 | Resp 16 | Ht 74.0 in | Wt 212.4 lb

## 2023-09-12 DIAGNOSIS — I5022 Chronic systolic (congestive) heart failure: Secondary | ICD-10-CM | POA: Diagnosis not present

## 2023-09-12 DIAGNOSIS — I428 Other cardiomyopathies: Secondary | ICD-10-CM | POA: Diagnosis not present

## 2023-09-12 DIAGNOSIS — I447 Left bundle-branch block, unspecified: Secondary | ICD-10-CM | POA: Diagnosis not present

## 2023-09-12 DIAGNOSIS — I25118 Atherosclerotic heart disease of native coronary artery with other forms of angina pectoris: Secondary | ICD-10-CM | POA: Diagnosis not present

## 2023-09-12 MED ORDER — ENTRESTO 24-26 MG PO TABS: 1.0000 | ORAL_TABLET | Freq: Two times a day (BID) | ORAL | 3 refills | Status: DC

## 2023-09-12 NOTE — Patient Instructions (Signed)
Medication Instructions:  Your physician has recommended you make the following change in your medication:  Stop Losartan Start Entresto 24/26 mg by mouth twice daily   *If you need a refill on your cardiac medications before your next appointment, please call your pharmacy*   Lab Work: Your physician recommends that you return for lab work in: 2 weeks.  BMP and BNP.  This is not fasting.   If you have labs (blood work) drawn today and your tests are completely normal, you will receive your results only by: MyChart Message (if you have MyChart) OR A paper copy in the mail If you have any lab test that is abnormal or we need to change your treatment, we will call you to review the results.   Testing/Procedures: none   Follow-Up: At [facility], you and your health needs are our priority.  As part of our continuing mission to provide you with exceptional heart care, we have created designated Provider Care Teams.  These Care Teams include your primary Cardiologist (physician) and Advanced Practice Providers (APPs -  Physician Assistants and Nurse Practitioners) who all work together to provide you with the care you need, when you need it.  We recommend signing up for the patient portal called "MyChart".  Sign up information is provided on this After Visit Summary.  MyChart is used to connect with patients for Virtual Visits (Telemedicine).  Patients are able to view lab/test results, encounter notes, upcoming appointments, etc.  Non-urgent messages can be sent to your provider as well.   To learn more about what you can do with MyChart, go to [email or web address].    Your next appointment:   3 month(s)  Provider:   Yates Decamp, MD     Other Instructions  You have been referred to see the pharmacist in our office.  Please schedule appointment for about 2 weeks from now.  You have been referred to Electrophysiology.  Please schedule new patient appointment.

## 2023-09-14 ENCOUNTER — Other Ambulatory Visit: Payer: Self-pay | Admitting: Cardiology

## 2023-09-14 DIAGNOSIS — I25110 Atherosclerotic heart disease of native coronary artery with unstable angina pectoris: Secondary | ICD-10-CM

## 2023-09-21 ENCOUNTER — Ambulatory Visit (INDEPENDENT_AMBULATORY_CARE_PROVIDER_SITE_OTHER): Payer: No Typology Code available for payment source | Admitting: Emergency Medicine

## 2023-09-21 ENCOUNTER — Encounter: Payer: Self-pay | Admitting: Emergency Medicine

## 2023-09-21 VITALS — BP 125/77 | HR 61 | Temp 97.3°F | Ht 73.0 in | Wt 213.2 lb

## 2023-09-21 DIAGNOSIS — J668 Airway disease due to other specific organic dusts: Secondary | ICD-10-CM | POA: Diagnosis not present

## 2023-09-21 DIAGNOSIS — Z77090 Contact with and (suspected) exposure to asbestos: Secondary | ICD-10-CM | POA: Diagnosis not present

## 2023-09-21 DIAGNOSIS — R0609 Other forms of dyspnea: Secondary | ICD-10-CM | POA: Diagnosis not present

## 2023-09-21 DIAGNOSIS — J849 Interstitial pulmonary disease, unspecified: Secondary | ICD-10-CM

## 2023-09-21 DIAGNOSIS — J4520 Mild intermittent asthma, uncomplicated: Secondary | ICD-10-CM

## 2023-09-21 NOTE — Assessment & Plan Note (Signed)
Subtle parenchymal inflammatory change that we have ascribed principally to his history of asbestos exposure although he does not have any of the typical sequela of asbestosis.  His CT chest from September shows progression compared with 2022.  That said his functional capacity and breathing are actually better.  I talked to him today about possible referral to the ILD clinic, autoimmune labs to look for possible causes for his ILD.  For now he would like to hold off, repeat his CT chest and PFT prior usual schedule in 1 year.  I will get a high-resolution CT and PFT in November and then we will see each other next December.  If he has a change in his breathing before that then we will do the workup sooner including autoimmune labs.

## 2023-09-21 NOTE — Assessment & Plan Note (Signed)
Obstructive lung disease on his PFT but no significant symptoms.  He has albuterol but he does not use it, has only used in the past in the setting of URI's.  Hold off on scheduled BD therapy for now given his clinical stability/improvement.

## 2023-09-21 NOTE — Progress Notes (Signed)
HPI:  ROV 08/10/22 --Franklin Jones is 72, former smoker with an occupational asbestos exposure.  He has mild to moderate obstructive lung disease on pulmonary function testing.  Normal volumes and diffusion capacity.  Chest imaging has not shown any specific sequela of asbestos exposure but there is some very subtle septal thickening and peripheral groundglass change without honeycomb.  He has not been on any scheduled bronchodilator therapy.  He does have albuterol, but he never needs it. Good functional capacity - he walks, is able to exert. He has a lot of congestion and drainage, minimal cough.   Chest x-ray performed today and reviewed by me shows normal cardiac silhouette, some coarsened peripheral interstitial markings unchanged compared with prior.  No pleural plaques or evidence of any pleural disease.  No infiltrates.  ROV 09/21/2023 --follow-up visit 72 year old gentleman with history of prior tobacco use and occupational asbestos exposure, associated mild to moderate COPD.  He has normal lung volumes and diffusion capacity on pulmonary function testing from 2022.  He has some subtle peripheral groundglass and septal thickening without any clear honeycomb change or specific sequela of asbestos.  Repeat CT done in September as below.  He overall feels well. His exertional tolerance and breathing are a bit better than last time I saw him. He is able to walk daily. [name] Kitchen He is dealing with a lot of head congestion, rhinitis w exertion that will impact his breathing. He has albuterol but never uses it.   CT scan of the chest 07/05/2023 reviewed by me, shows no pleural plaques or calcifications, finally nodular thickening of the interstitium bilaterally most prominent in the upper lobes in the superior segment of the lower lobes.  More prominent than on his prior CT 07/03/2021.  There is a new 6 mm anterior left upper lobe nodule.  Again no frank honeycomb change.    Vitals:   09/21/23 1519  BP: 125/77   Pulse: 61  Temp: (!) 97.3 F (36.3 C)  TempSrc: Oral  SpO2: 97%  Weight: 213 lb 3.2 oz (96.7 kg)  Height: 6\' 1"  (1.854 m)   Gen: Pleasant, well-nourished, in no distress,  normal affect  ENT: No lesions,  mouth clear,  oropharynx clear, no postnasal drip  Neck: No JVD, no stridor  Lungs: No use of accessory muscles, clear B   Cardiovascular: RRR, heart sounds normal, no murmur or gallops, no peripheral edema  Musculoskeletal: No deformities, no cyanosis or clubbing  Neuro: alert, non focal  Skin: Warm, no lesions or rashes  PULMONARY FUNCTON TEST 04/09/2010 04/28/2011 05/04/2012 06/06/2013  FVC 4.98 4.51 4.42 4.27  FEV1 3.68 3.2 3.24 3.1  FEV1/FVC 73.9 71 73.3 72.6  FVC  % Predicted 92 87 85 80  FEV % Predicted 90 88 90 78  FeF 25-75 2.66 2.01 2.32 4.09  FeF 25-75 % Predicted 3.32 3.29 3.25 5.04     ASSESSMENT/PLAN:  ILD (interstitial lung disease) (HCC) Subtle parenchymal inflammatory change that we have ascribed principally to his history of asbestos exposure although he does not have any of the typical sequela of asbestosis.  His CT chest from September shows progression compared with 2022.  That said his functional capacity and breathing are actually better.  I talked to him today about possible referral to the ILD clinic, autoimmune labs to look for possible causes for his ILD.  For now he would like to hold off, repeat his CT chest and PFT prior usual schedule in 1 year.  I will get a  high-resolution CT and PFT in November and then we will see each other next December.  If he has a change in his breathing before that then we will do the workup sooner including autoimmune labs.  Asthma Obstructive lung disease on his PFT but no significant symptoms.  He has albuterol but he does not use it, has only used in the past in the setting of URI's.  Hold off on scheduled BD therapy for now given his clinical stability/improvement.    Levy Pupa, MD, PhD 09/21/2023, 4:32  PM [facility] [phone] or if no answer [phone]

## 2023-09-21 NOTE — Patient Instructions (Signed)
We reviewed your CT scan of the chest today. We will plan to repeat your CT scan of the chest in November 2025 We will perform repeat pulmonary function testing in November 2025 Please follow Dr. Delton Coombes in 1 year so we can review those tests together

## 2023-09-23 DIAGNOSIS — I5022 Chronic systolic (congestive) heart failure: Secondary | ICD-10-CM | POA: Diagnosis not present

## 2023-09-24 LAB — BASIC METABOLIC PANEL
BUN/Creatinine Ratio: 19 (ref 10–24)
BUN: 24 mg/dL (ref 8–27)
CO2: 22 mmol/L (ref 20–29)
Calcium: 9.5 mg/dL (ref 8.6–10.2)
Chloride: 106 mmol/L (ref 96–106)
Creatinine, Ser: 1.27 mg/dL (ref 0.76–1.27)
Glucose: 103 mg/dL — ABNORMAL HIGH (ref 70–99)
Potassium: 4.5 mmol/L (ref 3.5–5.2)
Sodium: 141 mmol/L (ref 134–144)
eGFR: 60 mL/min/{1.73_m2} (ref >59)

## 2023-09-24 LAB — PRO B NATRIURETIC PEPTIDE: NT-Pro BNP: 158 pg/mL (ref 0–376)

## 2023-09-25 NOTE — Progress Notes (Signed)
Renal function stable.

## 2023-09-26 ENCOUNTER — Ambulatory Visit: Payer: Medicare Other | Attending: Cardiovascular Disease | Admitting: Student

## 2023-09-26 ENCOUNTER — Telehealth: Payer: Self-pay | Admitting: Pharmacist

## 2023-09-26 ENCOUNTER — Encounter: Payer: Self-pay | Admitting: Student

## 2023-09-26 VITALS — BP 130/82 | HR 70 | Wt 214.2 lb

## 2023-09-26 DIAGNOSIS — I5022 Chronic systolic (congestive) heart failure: Secondary | ICD-10-CM | POA: Diagnosis not present

## 2023-09-26 MED ORDER — ENTRESTO 49-51 MG PO TABS: 1.0000 | ORAL_TABLET | Freq: Two times a day (BID) | ORAL | 6 refills | Status: DC

## 2023-09-26 NOTE — Telephone Encounter (Signed)
HW grant for [employer]    WU:[id number] BIN :[id number] HYQ:[id number] [employer] :[id number]

## 2023-09-26 NOTE — Assessment & Plan Note (Signed)
Assessment: In office BP 130/82 heart rate 70  At home BP ~120/80 heart rate 60-65  Tolerates Entresto and Carvedilol well without any side effects Denies SOB, palpitation, chest pain, headaches,or swelling Denies dizziness, lightheadedness, OB and fatigue.  Denies  chest pain or palpitations.   Able to complete all ADLs. Stable weight at home (normal range 214 - 215 lbs).  Denies LEE, PND, or orthopnea.  Appetite has been normal.  Adheres to a low-salt diet. Reiterated the importance of regular exercise and low salt diet   Plan:  Increase Entresto dose to 49-51 mg twice daily  Patient has trouble affording Sherryll [facility] - will enroll him in [place]  Continue taking Carvedilol 12.5 mg twice daily   Patient to keep record of BP readings with heart rate and report to [place] at the next visit Patient to see PharmD in 4-5 weeks for follow up  Follow up lab(s):BMP on Dec 30

## 2023-09-26 NOTE — Patient Instructions (Addendum)
Changes made by your pharmacist Carmela Hurt, PharmD at today's visit:    Instructions/Changes  (what do you need to do) Your Notes  (what you did and when you did it)  Continue taking carvedilol 12.5 mg twice daily    Increase Entresto from 24-26 mg twice daily to 49-51 mg twice daily    Follow low salt diet and cut down on caffeine intake from 4 cups of coffee to 2 cups per day     Bring all of your meds, your BP cuff and your record of home blood pressures to your next appointment.    HOW TO TAKE YOUR BLOOD PRESSURE AT HOME  Rest 5 minutes before taking your blood pressure.  Don't smoke or drink caffeinated beverages for at least 30 minutes before. Take your blood pressure before (not after) you eat. Sit comfortably with your back supported and both feet on the floor (don't cross your legs). Elevate your arm to heart level on a table or a desk. Use the proper sized cuff. It should fit smoothly and snugly around your bare upper arm. There should be enough room to slip a fingertip under the cuff. The bottom edge of the cuff should be 1 inch above the crease of the elbow. Ideally, take 3 measurements at one sitting and record the average.  Important lifestyle changes to control high blood pressure  Intervention  Effect on the BP  Lose extra pounds and watch your waistline Weight loss is one of the most effective lifestyle changes for controlling blood pressure. If you're overweight or obese, losing even a small amount of weight can help reduce blood pressure. Blood pressure might go down by about 1 millimeter of mercury (mm Hg) with each kilogram (about 2.2 pounds) of weight lost.  Exercise regularly As a general goal, aim for at least 30 minutes of moderate physical activity every day. Regular physical activity can lower high blood pressure by about 5 to 8 mm Hg.  Eat a healthy diet Eating a diet rich in whole grains, fruits, vegetables, and low-fat dairy products and low in  saturated fat and cholesterol. A healthy diet can lower high blood pressure by up to 11 mm Hg.  Reduce salt (sodium) in your diet Even a small reduction of sodium in the diet can improve heart health and reduce high blood pressure by about 5 to 6 mm Hg.  Limit alcohol One drink equals 12 ounces of beer, 5 ounces of wine, or 1.5 ounces of 80-proof liquor.  Limiting alcohol to less than one drink a day for women or two drinks a day for men can help lower blood pressure by about 4 mm Hg.   If you have any questions or concerns please use My Chart to send questions or call the office at [phone]

## 2023-09-26 NOTE — Progress Notes (Signed)
Patient ID: [name]                 DOB: May 25, 1951                      MRN: [id number]     HPI: Franklin Jones is a 72 y.o. male referred by Franklin Jones to pharmacy clinic for HF medication management. PMH is significant for hypertension, stage III chronic kidney disease, hyperlipidemia, coronary artery disease,HFrEF. Most recent LVEF 25% on 08/08/2023. complete occlusion of proximal LAD to mid LAD and acute marginal of RCA -12/28/2017,S/P Mid LAD PCI in June 2019 with improvement in EF to 45%. However recent echocardiogram in January 24 revealed EF 30 to 35%, and had developed new left bundle branch block, underwent cardiac catheterization on 11/23/2022 revealing widely patent LAD stent.  Patient presented today for HF medication optimization. Reports he tolerates Entresto well. BMP was WNL too. He does not check his BP at home everyday but once or twice a week. He watches his diet for salt, drinks 4 cups of coffee pr day. He exercise every day (2-3 miles walks) Denies dizziness, lightheadedness, SOB and fatigue. Denies  chest pain or palpitations.  Able to complete all ADLs. He checks his weight at home (normal range 214 - 215 lbs). Denies LEE, PND, or orthopnea. Appetite has been normal. he adheres to a low-salt diet.  Discussion with patient today included the following: cardiac medication indications, introduction to GDMT clinic, reasoning behind medication titration, importance of medication adherence, and patient engagement.     Current CHF meds: carvedilol 12.5 mg twice daily, Entresto 24-26 mg twice daily  Previously tried: losartan 25 mg daily  Adherence Assessment  Do you ever forget to take your medication? [] Yes [x] No  Do you ever skip doses due to side effects? [] Yes [x] No  Do you have trouble affording your medicines? [x] Yes [] No  Are you ever unable to pick up your medication due to transportation difficulties? [] Yes [x] No  Do you ever stop taking your medications because  you don't believe they are helping? [] Yes [x] No  Do you check your weight daily? [x] Yes [] No   Adherence strategy: pill box  Barriers to obtaining medications: none  BP goal: <130/80   Family History:   Social History:  Alcohol: 4 drinks per week  Smoking: quit 30 years ago    Diet: low salt healthy home cooked meals   Exercise: 2-3 miles walks 5 days a week   Home BP readings:  120/80 heart rate 60-65   Wt Readings from Last 3 Encounters:  09/26/23 214 lb 3.2 oz (97.2 kg)  09/21/23 213 lb 3.2 oz (96.7 kg)  09/12/23 212 lb 6.4 oz (96.3 kg)   BP Readings from Last 3 Encounters:  09/26/23 130/82  09/21/23 125/77  09/12/23 138/78   Pulse Readings from Last 3 Encounters:  09/26/23 70  09/21/23 61  09/12/23 65    Renal function: Estimated Creatinine Clearance: 63.6 mL/min (by C-G formula based on SCr of 1.27 mg/dL).  Past Medical History:  Diagnosis Date   Acute on chronic combined systolic and diastolic CHF (congestive heart failure) (HCC) 12/15/2017   Acute on chronic heart failure (HCC) 12/15/2017   Allergic rhinitis    Arthritis    "qwhere" (6//19/2019)   Asbestosis(501)    followed by Dr Franklin Jones- "on monitoring only; stable x years" (04/05/2018)   Basal cell carcinoma    "face; head" (04/05/2018)   Degenerative arthritis  of knee 11/19/2011   Dyspnea on exertion 02/01/2019   GERD (gastroesophageal reflux disease)    High cholesterol    History of asbestos exposure    followed by Dr Franklin Jones- states stable years- last CT chest 7/12 in Epic   Hypertension    LBBB (left bundle branch block) 01/31/2018   Shortness of breath    SOME SOB BUT CAN CLIMB FLIGHT OF STAIRS   Squamous carcinoma    "back of neck" (04/05/2018)    Current Outpatient Medications on File Prior to Visit  Medication Sig Dispense Refill   aspirin EC 81 MG tablet Take 1 tablet (81 mg total) by mouth daily. (Patient taking differently: Take 81 mg by mouth every evening.) 90 tablet 3    carvedilol (COREG) 12.5 MG tablet take (1) tablet twice daily. 180 tablet 3   fexofenadine (ALLEGRA) 180 MG tablet Take 180 mg by mouth in the morning.     Multiple Vitamins-Minerals (CENTRUM SILVER 50+MEN) TABS Take 1 tablet by mouth in the morning.     nitroGLYCERIN (NITROSTAT) 0.4 MG SL tablet place (1) tablet under tongue every 5 minutes up to (3) doses. if no relief call 911. 25 tablet 0   rosuvastatin (CRESTOR) 10 MG tablet take 1 tablet once daily. 90 tablet 1   sildenafil (VIAGRA) 100 MG tablet Take 100 mg by mouth daily as needed for erectile dysfunction.     VENTOLIN HFA 108 (90 Base) MCG/ACT inhaler INHALE 2 PUFFS EVERY 4 HOURS AS NEEDED FOR WHEEZING OR SHORTNESS OF BREATH. 18 g 3   No current facility-administered medications on file prior to visit.    Allergies  Allergen Reactions   Penicillins Rash    Has patient had a PCN reaction causing immediate rash, facial/tongue/throat swelling, SOB or lightheadedness with hypotension: unkn Has patient had a PCN reaction causing severe rash involving mucus membranes or skin necrosis: unkn Has patient had a PCN reaction that required hospitalization: no Has patient had a PCN reaction occurring within the last 10 years: no If all of the above answers are "NO", then may proceed with Cephalosporin use.      Assessment/Plan:  1. CHF -  Chronic systolic CHF (congestive heart failure) (HCC) Assessment: In office BP 130/82 heart rate 70  At home BP ~120/80 heart rate 60-65  Tolerates Entresto and Carvedilol well without any side effects Denies SOB, palpitation, chest pain, headaches,or swelling Denies dizziness, lightheadedness, OB and fatigue.  Denies  chest pain or palpitations.   Able to complete all ADLs. Stable weight at home (normal range 214 - 215 lbs).  Denies LEE, PND, or orthopnea.  Appetite has been normal.  Adheres to a low-salt diet. Reiterated the importance of regular exercise and low salt diet   Plan:  Increase  Entresto dose to 49-51 mg twice daily  Patient has trouble affording [facility] - will enroll him in [facility]  Continue taking Carvedilol 12.5 mg twice daily   Patient to keep record of BP readings with heart rate and report to [place] at the next visit Patient to see PharmD in 4-5 weeks for follow up  Follow up lab(s):BMP on Dec 30   Chronic systolic heart failure (HCC) -     Basic metabolic panel  Chronic systolic CHF (congestive heart failure) (HCC) Overview:  Current CHF meds: carvedilol 12.5 mg twice daily, Entresto 24-26 mg twice daily  Previously tried: losartan 25 mg daily  Diuretic use: none Dry weight: 214 lbs  Adherence strategy: pill box  Barriers  to obtaining medications:cost- enrolled in the [facility]   Assessment & Plan: Assessment: In office BP 130/82 heart rate 70  At home BP ~120/80 heart rate 60-65  Tolerates Entresto and Carvedilol well without any side effects Denies SOB, palpitation, chest pain, headaches,or swelling Denies dizziness, lightheadedness, OB and fatigue.  Denies  chest pain or palpitations.   Able to complete all ADLs. Stable weight at home (normal range 214 - 215 lbs).  Denies LEE, PND, or orthopnea.  Appetite has been normal.  Adheres to a low-salt diet. Reiterated the importance of regular exercise and low salt diet   Plan:  Increase Entresto dose to 49-51 mg twice daily  Patient has trouble affording Sherryll Burger - will enroll him in [place]  Continue taking Carvedilol 12.5 mg twice daily   Patient to keep record of BP readings with heart rate and report to [place] at the next visit Patient to see PharmD in 4-5 weeks for follow up  Follow up lab(s):BMP on Dec 30     Other orders -     Entresto; Take 1 tablet by mouth 2 (two) times daily.  Dispense: 60 tablet; Refill: 6       Thank you   Carmela Hurt, Pharm.D [facility] A Division of [facility] [address] [place], [place] [address]  Phone: [phone]; Fax:  [phone]

## 2023-10-06 ENCOUNTER — Telehealth: Payer: Self-pay | Admitting: Pharmacist

## 2023-10-06 MED ORDER — SACUBITRIL-VALSARTAN 24-26 MG PO TABS: 1.0000 | ORAL_TABLET | Freq: Two times a day (BID) | ORAL | Status: DC

## 2023-10-06 NOTE — Telephone Encounter (Signed)
Call to f/u on Entresto patient reports since he started taking higher dose Entresto he feels tired and his BP runs low. Home  BP 98/60,89/55,101/60, and 79/54 heart rate ~62,61,53,64 Want to go back on 24-26 mg dose. Advised to cut higher dose tab (49-51 mg) in half and take twice daily. Med list updated will send updated prescription when we see him next time so get appropriate dose filled from the pharmacy.

## 2023-10-17 ENCOUNTER — Encounter: Payer: Self-pay | Admitting: Cardiovascular Disease

## 2023-10-17 ENCOUNTER — Ambulatory Visit: Payer: Medicare Other | Attending: Cardiovascular Disease | Admitting: Cardiovascular Disease

## 2023-10-17 VITALS — BP 120/82 | HR 65 | Ht 73.0 in | Wt 213.4 lb

## 2023-10-17 DIAGNOSIS — I5022 Chronic systolic (congestive) heart failure: Secondary | ICD-10-CM | POA: Insufficient documentation

## 2023-10-17 DIAGNOSIS — I428 Other cardiomyopathies: Secondary | ICD-10-CM | POA: Diagnosis not present

## 2023-10-17 DIAGNOSIS — I447 Left bundle-branch block, unspecified: Secondary | ICD-10-CM | POA: Diagnosis not present

## 2023-10-17 NOTE — Patient Instructions (Signed)
Medication Instructions:  Your physician recommends that you continue on your current medications as directed. Please refer to the Current Medication list given to you today. *If you need a refill on your cardiac medications before your next appointment, please call your pharmacy*   Testing/Procedures: BiV ICD implant - please contact our office if you decide you would like to schedule  Your physician has recommended that you have a defibrillator inserted. An implantable cardioverter defibrillator (ICD) is a small device that is placed in your chest or, in rare cases, your abdomen. This device uses electrical pulses or shocks to help control life-threatening, irregular heartbeats that could lead the heart to suddenly stop beating (sudden cardiac arrest). Leads are attached to the ICD that goes into your heart. This is done in the hospital and usually requires an overnight stay.   Follow-Up: At [facility], you and your health needs are our priority.  As part of our continuing mission to provide you with exceptional heart care, we have created designated Provider Care Teams.  These Care Teams include your primary Cardiologist (physician) and Advanced Practice Providers (APPs -  Physician Assistants and Nurse Practitioners) who all work together to provide you with the care you need, when you need it.  We recommend signing up for the patient portal called "MyChart".  Sign up information is provided on this After Visit Summary.  MyChart is used to connect with patients for Virtual Visits (Telemedicine).  Patients are able to view lab/test results, encounter notes, upcoming appointments, etc.  Non-urgent messages can be sent to your provider as well.   To learn more about what you can do with MyChart, go to [email or web address].    Your next appointment:   Contact our office if you decide you would like to move forward with ICD implant  Provider:   York Pellant, MD

## 2023-10-17 NOTE — Progress Notes (Signed)
Electrophysiology Office Note:    Date:  10/17/2023   ID:  REN GLADYS, DOB May 05, 1951, MRN [id number]  PCP:  Ignatius Specking, MD   [facility] Providers Cardiologist:  Yates Decamp, MD Electrophysiologist:  Maurice Small, MD     Referring MD: Yates Decamp, MD   History of Present Illness:    Franklin Jones is a 72 y.o. male with a medical history significant for hypertension, CKD 3, CAD, referred for possible BiV ICD.     I discussed the use of AI scribe software for clinical note transcription with the patient, who gave verbal consent to proceed.  He has a history of pLAD occlusion and acute marginal of the RCA in 2019.  His ejection fraction was 20 to 25% at that time.  He underwent PCI in June 2019 and his EF improved to 45%.  He developed left bundle branch block and EF declined again to 30 to 35% in January 2024.  Repeat coronary angiogram showed widely patent LAD stent.  Repeat echocardiogram in October 2024 after GDMT showed persistently decreased EF at 25% with a small apical aneurysm.  He reports that he is very active.  [place] to cut wood and use a chainsaw.  He enjoys working on cars.  He feels that he has fairly little limitation in his activity.     Today, he reports that he is doing well  EKGs/Labs/Other Studies Reviewed Today:     Echocardiogram:  TTE October 2024 EF 25% with dyssynchrony and a small apical aneurysm.  Grade 1 diastolic dysfunction.     Cardiac catherization  11/2022 cath reviewed in Epic  EKG:   EKG Interpretation Date/Time:  Monday October 17 2023 10:44:27 EST Ventricular Rate:  65 PR Interval:  200 QRS Duration:  172 QT Interval:  450 QTC Calculation: 468 R Axis:   45  Text Interpretation: Sinus rhythm with occasional Premature ventricular complexes Left bundle branch block When compared with ECG of 17-Oct-2023 10:41, PVCs are present Confirmed by York Pellant [phone]) on 10/17/2023  11:25:39 AM     Physical Exam:    VS:  BP 120/82 (BP Location: Left Arm, Patient Position: Sitting, Cuff Size: Large)   Pulse 65   Ht 6\' 1"  (1.854 m)   Wt 213 lb 6.4 oz (96.8 kg)   SpO2 97%   BMI 28.15 kg/m     Wt Readings from Last 3 Encounters:  10/17/23 213 lb 6.4 oz (96.8 kg)  09/26/23 214 lb 3.2 oz (97.2 kg)  09/21/23 213 lb 3.2 oz (96.7 kg)     GEN: Well nourished, well developed in no acute distress CARDIAC: RRR, no murmurs, rubs, gallops RESPIRATORY:  Normal work of breathing MUSCULOSKELETAL: no edema    ASSESSMENT & PLAN:     Ischemic cardiomyopathy, CHFrEF EF is persistently 25 to 30% despite GDMT NYHA II 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  He is not ready to commit to the procedure at this time.  He understands that there is some risk of sudden cardiac death. He was given our contact information to schedule if he should decide to proceed Continue carvedilol 12.5 mg, Entresto 24-26 mg  I discussed the indication for the procedure and the logistics, risks, potential benefit, and after care. I specifically explained that risks include but are not limited to infection, bleeding,damage to blood vessels, lung, and the  heart -- but risk of prolonged hospitalization, need for surgery, or the event of stroke, heart attack, or death are low but not zero.    Coronary artery disease No recent symptoms of angina Continue aspirin 81 mg, carvedilol 12.5 mg, rosuvastatin 10 mg    Signed, Maurice Small, MD  10/17/2023 11:45 AM    [facility]

## 2023-10-31 ENCOUNTER — Encounter: Payer: Self-pay | Admitting: Student

## 2023-10-31 ENCOUNTER — Ambulatory Visit: Payer: Medicare Other | Attending: Cardiology | Admitting: Student

## 2023-10-31 VITALS — BP 120/68 | HR 67 | Ht 73.0 in | Wt 108.0 lb

## 2023-10-31 DIAGNOSIS — I5022 Chronic systolic (congestive) heart failure: Secondary | ICD-10-CM | POA: Diagnosis not present

## 2023-10-31 MED ORDER — EMPAGLIFLOZIN 10 MG PO TABS: 10.0000 mg | ORAL_TABLET | Freq: Every day | ORAL | 11 refills | Status: DC

## 2023-10-31 MED ORDER — ENTRESTO 24-26 MG PO TABS: 1.0000 | ORAL_TABLET | Freq: Two times a day (BID) | ORAL | 11 refills | Status: DC

## 2023-10-31 NOTE — Progress Notes (Signed)
 Patient ID: Franklin Jones                 DOB: [birth year]                      MRN: [id number]     HPI: Franklin Jones is a 73 y.o. male referred by Dr. Ladona to pharmacy clinic for HF medication management. PMH is significant for hypertension, stage III chronic kidney disease, hyperlipidemia, coronary artery disease,HFrEF. Most recent LVEF 25% on 08/08/2023. complete occlusion of proximal LAD to mid LAD and acute marginal of RCA -12/28/2017,S/P Mid LAD PCI in June 2019 with improvement in EF to 45%. However recent echocardiogram in January 24 revealed EF 30 to 35%, and had developed new left bundle branch block, underwent cardiac catheterization on 11/23/2022 revealing widely patent LAD stent.  Entresto  dose was increased to 49-51 mg twice daily -pt was unable to tolerate due to hypotension, dizziness and tiredness. Dose was reduce back to 24-26 mg twice daily.  Patient presented today with his BP monitor and BP and weight log. Home BP monitor found out to be accurate. Weight has been stable ~108-110 range.  Home BP ~90-110 /60-80 heart rate ~ high 50's. Reports he tolerates Entresto  well. BMP was WNL too. Denies dizziness, lightheadedness, SOB and fatigue. Denies  chest pain or palpitations.  Able to complete all ADLs. He checks his weight at home (normal range 214 - 215 lbs). Denies LEE, PND, or orthopnea. Appetite has been normal. he adheres to a low-salt diet. Pt is in agreement to start Jardiance  10 mg today. Has new part D insurance advised to get new card scan at front desk and let us  know if new insurance needs PA for any of the heart medications     Current CHF meds: carvedilol  12.5 mg twice daily, Entresto  24-26 mg twice daily  Previously tried: losartan  25 mg daily  Adherence Assessment  Do you ever forget to take your medication? [] Yes [x] No  Do you ever skip doses due to side effects? [] Yes [x] No  Do you have trouble affording your medicines? [x] Yes [] No  Are you ever unable to pick up  your medication due to transportation difficulties? [] Yes [x] No  Do you ever stop taking your medications because you don't believe they are helping? [] Yes [x] No  Do you check your weight daily?stable in 108-110 lbs range  [x] Yes [] No   Adherence strategy: pill box  Barriers to obtaining medications: none  BP goal: <130/80   Family History:   Social History:  Alcohol : 4 drinks per week  Smoking: quit 30 years ago    Diet: low salt healthy home cooked meals   Exercise: 2-3 miles walks 5 days a week   Home BP readings:  90-110 /60-80 ht high 50's   Wt Readings from Last 3 Encounters:  10/31/23 108 lb (49 kg)  10/17/23 213 lb 6.4 oz (96.8 kg)  09/26/23 214 lb 3.2 oz (97.2 kg)   BP Readings from Last 3 Encounters:  10/31/23 120/68  10/17/23 120/82  09/26/23 130/82   Pulse Readings from Last 3 Encounters:  10/31/23 67  10/17/23 65  09/26/23 70    Renal function: CrCl cannot be calculated (Patient's most recent lab result is older than the maximum 21 days allowed.).  Past Medical History:  Diagnosis Date   Acute on chronic combined systolic and diastolic CHF (congestive heart failure) (HCC) 12/15/2017   Acute on chronic heart failure (HCC) 12/15/2017   Allergic  rhinitis    Arthritis    qwhere (6//19/2019)   Asbestosis(501)    followed by Dr Cheryal roann [place]- on monitoring only; stable x years (04/05/2018)   Basal cell carcinoma    face; head (04/05/2018)   Degenerative arthritis of knee 11/19/2011   Dyspnea on exertion 02/01/2019   GERD (gastroesophageal reflux disease)    High cholesterol    History of asbestos exposure    followed by Dr Cheryal- states stable years- last CT chest 7/12 in Epic   Hypertension    LBBB (left bundle branch block) 01/31/2018   Shortness of breath    SOME SOB BUT CAN CLIMB FLIGHT OF STAIRS   Squamous carcinoma    back of neck (04/05/2018)    Current Outpatient Medications on File Prior to Visit  Medication Sig Dispense Refill    aspirin  EC 81 MG tablet Take 1 tablet (81 mg total) by mouth daily. (Patient taking differently: Take 81 mg by mouth every evening.) 90 tablet 3   carvedilol  (COREG ) 12.5 MG tablet take (1) tablet twice daily. 180 tablet 3   fexofenadine (ALLEGRA) 180 MG tablet Take 180 mg by mouth in the morning.     Multiple Vitamins-Minerals (CENTRUM SILVER 50+MEN) TABS Take 1 tablet by mouth in the morning.     nitroGLYCERIN  (NITROSTAT ) 0.4 MG SL tablet place (1) tablet under tongue every 5 minutes up to (3) doses. if no relief call 911. 25 tablet 0   rosuvastatin  (CRESTOR ) 10 MG tablet take 1 tablet once daily. 90 tablet 1   VENTOLIN  HFA 108 (90 Base) MCG/ACT inhaler INHALE 2 PUFFS EVERY 4 HOURS AS NEEDED FOR WHEEZING OR SHORTNESS OF BREATH. 18 g 3   [DISCONTINUED] sacubitril -valsartan  (ENTRESTO ) 24-26 MG Take 1 tablet by mouth 2 (two) times daily.     sildenafil (VIAGRA) 100 MG tablet Take 100 mg by mouth daily as needed for erectile dysfunction.     No current facility-administered medications on file prior to visit.    Allergies  Allergen Reactions   Penicillins Rash    Has patient had a PCN reaction causing immediate rash, facial/tongue/throat swelling, SOB or lightheadedness with hypotension: unkn Has patient had a PCN reaction causing severe rash involving mucus membranes or skin necrosis: unkn Has patient had a PCN reaction that required hospitalization: no Has patient had a PCN reaction occurring within the last 10 years: no If all of the above answers are NO, then may proceed with Cephalosporin use.      Assessment/Plan:  1. CHF -  Chronic systolic CHF (congestive heart failure) (HCC) Assessment: In office BP 120/68 heart rate 67  At home BP 90-110 /60-80 heart rate  high 50's  Tolerates Entresto  low dose and Carvedilol  12.5 mg twice daily well without any side effects Unable to tolerate moderate dose Entresto  due to dizziness, tiredness and hypotension   in agreement to start  Jardiance  10 mg today - dose, administration and common s/e has been discussed with pt  Denies SOB, palpitation, chest pain, headaches,or swelling Denies dizziness, lightheadedness, OB and fatigue.  Denies  chest pain or palpitations.   Able to complete all ADLs. Stable weight at home (normal range 214 - 215 lbs).  Denies LEE, PND, or orthopnea.  Appetite has been normal Adheres to a low-salt diet   Plan:  Continue taking  Entresto  dose to 24-26 mg twice daily and carvedilol  12.5 mg twice daily  Start taking Jardiance  10 mg daily  Patient to keep record of BP readings  with heart rate and report to us  at the next visit Patient to see PharmD in 4-5 weeks for follow up  Follow up lab(s):BMP on Dec 30   Chronic systolic CHF (congestive heart failure) (HCC) Overview:  Current CHF meds: carvedilol  12.5 mg twice daily, Entresto  24-26 mg twice daily, Jardiance  10 mg daily  Previously tried: losartan  25 mg daily  Diuretic use: none Dry weight: 214 lbs  Adherence strategy: pill box  Barriers to obtaining medications:cost- enrolled in the [facility]   Assessment & Plan: Assessment: In office BP 120/68 heart rate 67  At home BP 90-110 /60-80 heart rate  high 50's  Tolerates Entresto  low dose and Carvedilol  12.5 mg twice daily well without any side effects Unable to tolerate moderate dose Entresto  due to dizziness, tiredness and hypotension   in agreement to start Jardiance  10 mg today - dose, administration and common s/e has been discussed with pt  Denies SOB, palpitation, chest pain, headaches,or swelling Denies dizziness, lightheadedness, OB and fatigue.  Denies  chest pain or palpitations.   Able to complete all ADLs. Stable weight at home (normal range 214 - 215 lbs).  Denies LEE, PND, or orthopnea.  Appetite has been normal Adheres to a low-salt diet   Plan:  Continue taking  Entresto  dose to 24-26 mg twice daily and carvedilol  12.5 mg twice daily  Start taking Jardiance  10 mg  daily  Patient to keep record of BP readings with heart rate and report to us  at the next visit Patient to see PharmD in 4-5 weeks for follow up  Follow up lab(s):BMP on Dec 30    Orders: -     Basic metabolic panel  Other orders -     Entresto ; Take 1 tablet by mouth 2 (two) times daily.  Dispense: 60 tablet; Refill: 11 -     Empagliflozin ; Take 1 tablet (10 mg total) by mouth daily before breakfast.  Dispense: 30 tablet; Refill: 11        Thank you   Robbi Blanch, Pharm.D [facility] A Division of  [facility] [address], [place], [place] [address]  Phone: [phone]; Fax: [phone]

## 2023-10-31 NOTE — Assessment & Plan Note (Addendum)
 Assessment: In office BP 120/68 heart rate 67  At home BP 90-110 /60-80 heart rate  high 50's  Tolerates Entresto  low dose and Carvedilol  12.5 mg twice daily well without any side effects Unable to tolerate moderate dose Entresto  due to dizziness, tiredness and hypotension   in agreement to start Jardiance  10 mg today - dose, administration and common s/e has been discussed with pt  Denies SOB, palpitation, chest pain, headaches,or swelling Denies dizziness, lightheadedness, OB and fatigue.  Denies  chest pain or palpitations.   Able to complete all ADLs. Stable weight at home (normal range 214 - 215 lbs).  Denies LEE, PND, or orthopnea.  Appetite has been normal Adheres to a low-salt diet   Plan:  Continue taking  Entresto  dose to 24-26 mg twice daily and carvedilol  12.5 mg twice daily  Start taking Jardiance  10 mg daily  Patient to keep record of BP readings with heart rate and report to us  at the next visit Patient to see PharmD in 4-5 weeks for follow up  Follow up lab(s):BMP - Jan27

## 2023-10-31 NOTE — Patient Instructions (Addendum)
 Changes made by your pharmacist Robbi Blanch, PharmD at today's visit:    Instructions/Changes  (what do you need to do) Your Notes  (what you did and when you did it)  Start taking Jardiance  10 mg daily    Continue taking carvedilol  12.5 mg twice daily, Entresto  24-26 mg twice daily    Get BMP - lab done on Nov 14, 2023     Bring all of your meds, your BP cuff and your record of home blood pressures to your next appointment.    HOW TO TAKE YOUR BLOOD PRESSURE AT HOME  Rest 5 minutes before taking your blood pressure.  Don't smoke or drink caffeinated beverages for at least 30 minutes before. Take your blood pressure before (not after) you eat. Sit comfortably with your back supported and both feet on the floor (don't cross your legs). Elevate your arm to heart level on a table or a desk. Use the proper sized cuff. It should fit smoothly and snugly around your bare upper arm. There should be enough room to slip a fingertip under the cuff. The bottom edge of the cuff should be 1 inch above the crease of the elbow. Ideally, take 3 measurements at one sitting and record the average.  Important lifestyle changes to control high blood pressure  Intervention  Effect on the BP  Lose extra pounds and watch your waistline Weight loss is one of the most effective lifestyle changes for controlling blood pressure. If you're overweight or obese, losing even a small amount of weight can help reduce blood pressure. Blood pressure might go down by about 1 millimeter of mercury (mm Hg) with each kilogram (about 2.2 pounds) of weight lost.  Exercise regularly As a general goal, aim for at least 30 minutes of moderate physical activity every day. Regular physical activity can lower high blood pressure by about 5 to 8 mm Hg.  Eat a healthy diet Eating a diet rich in whole grains, fruits, vegetables, and low-fat dairy products and low in saturated fat and cholesterol. A healthy diet can lower high blood  pressure by up to 11 mm Hg.  Reduce salt (sodium) in your diet Even a small reduction of sodium in the diet can improve heart health and reduce high blood pressure by about 5 to 6 mm Hg.  Limit alcohol  One drink equals 12 ounces of beer, 5 ounces of wine, or 1.5 ounces of 80-proof liquor.  Limiting alcohol  to less than one drink a day for women or two drinks a day for men can help lower blood pressure by about 4 mm Hg.   If you have any questions or concerns please use My Chart to send questions or call the office at [phone]

## 2023-11-16 DIAGNOSIS — I5022 Chronic systolic (congestive) heart failure: Secondary | ICD-10-CM | POA: Diagnosis not present

## 2023-11-17 LAB — BASIC METABOLIC PANEL
BUN/Creatinine Ratio: 17 (ref 10–24)
BUN: 24 mg/dL (ref 8–27)
CO2: 20 mmol/L (ref 20–29)
Calcium: 9.7 mg/dL (ref 8.6–10.2)
Chloride: 104 mmol/L (ref 96–106)
Creatinine, Ser: 1.45 mg/dL — ABNORMAL HIGH (ref 0.76–1.27)
Glucose: 88 mg/dL (ref 70–99)
Potassium: 5.5 mmol/L — ABNORMAL HIGH (ref 3.5–5.2)
Sodium: 141 mmol/L (ref 134–144)
eGFR: 51 mL/min/{1.73_m2} — ABNORMAL LOW (ref >59)

## 2023-11-17 NOTE — Addendum Note (Signed)
Addended by: Tylene Fantasia on: 11/17/2023 08:23 AM   Modules accepted: Orders

## 2023-11-17 NOTE — Progress Notes (Signed)
Noted.  Thanks MJP

## 2023-11-22 DIAGNOSIS — I5022 Chronic systolic (congestive) heart failure: Secondary | ICD-10-CM | POA: Diagnosis not present

## 2023-11-22 LAB — BASIC METABOLIC PANEL WITH GFR
BUN/Creatinine Ratio: 15 (ref 10–24)
BUN: 23 mg/dL (ref 8–27)
CO2: 21 mmol/L (ref 20–29)
Calcium: 9.7 mg/dL (ref 8.6–10.2)
Chloride: 104 mmol/L (ref 96–106)
Creatinine, Ser: 1.53 mg/dL — ABNORMAL HIGH (ref 0.76–1.27)
Glucose: 126 mg/dL — ABNORMAL HIGH (ref 70–99)
Potassium: 4.7 mmol/L (ref 3.5–5.2)
Sodium: 141 mmol/L (ref 134–144)
eGFR: 48 mL/min/1.73 — ABNORMAL LOW

## 2023-11-23 ENCOUNTER — Telehealth: Payer: Self-pay | Admitting: Pharmacist

## 2023-11-23 NOTE — Telephone Encounter (Signed)
 Spoke to patient , discussed BMP result. Potassium level WNL. Little dip in renal function after starting SGLT2i is normal.

## 2023-11-29 ENCOUNTER — Telehealth: Payer: Self-pay

## 2023-11-29 NOTE — Telephone Encounter (Signed)
Attempted to call patient, left message to call office back - wanting to move forward with BiV ICD???

## 2023-12-12 ENCOUNTER — Encounter: Payer: Self-pay | Admitting: Cardiology

## 2023-12-12 ENCOUNTER — Encounter: Payer: Self-pay | Admitting: Pharmacist

## 2023-12-12 ENCOUNTER — Ambulatory Visit: Payer: Medicare Other | Attending: Internal Medicine | Admitting: Pharmacist

## 2023-12-12 VITALS — BP 134/90 | HR 57 | Wt 214.0 lb

## 2023-12-12 DIAGNOSIS — I5022 Chronic systolic (congestive) heart failure: Secondary | ICD-10-CM | POA: Diagnosis not present

## 2023-12-12 LAB — BASIC METABOLIC PANEL
BUN/Creatinine Ratio: 18 (ref 10–24)
BUN: 25 mg/dL (ref 8–27)
CO2: 23 mmol/L (ref 20–29)
Calcium: 9.6 mg/dL (ref 8.6–10.2)
Chloride: 107 mmol/L — ABNORMAL HIGH (ref 96–106)
Creatinine, Ser: 1.36 mg/dL — ABNORMAL HIGH (ref 0.76–1.27)
Glucose: 102 mg/dL — ABNORMAL HIGH (ref 70–99)
Potassium: 5.2 mmol/L (ref 3.5–5.2)
Sodium: 141 mmol/L (ref 134–144)
eGFR: 55 mL/min/{1.73_m2} — ABNORMAL LOW (ref >59)

## 2023-12-12 NOTE — Progress Notes (Signed)
 Very stable renal function with mild dysfunction

## 2023-12-12 NOTE — Assessment & Plan Note (Addendum)
 Assessment: In office BP 134/90 heart rate 57 - pt reports he gets stressed at OV   At home BP 90-110 /65-70 heart rate  high 50's  Tolerates Entresto low dose, Carvedilol 12.5 mg twice daily, and Jardiance 10 mg daily   well without any side effects Unable to tolerate moderate dose Entresto due to dizziness, tiredness and hypotension  Last BMP little drop in renal function noted after starting Jardiance  Denies SOB, palpitation, chest pain, headaches,or swelling Dizziness only he changes his position too quick Able to complete all ADLs. Stable weight at home (normal range 214 - 215 lbs),Denies LEE, PND, or orthopnea Appetite has been normal,adheres to a low-salt diet   Plan:  Continue taking  Entresto dose to 24-26 mg twice daily, Jardiance 10 mg, and carvedilol 12.5 mg twice daily  Will get updated BMP today to assess K level and renal function  If K WNL and Renal function back to baseline we will optimize GDMT by adding low dose MRA  Lab: BMP today  F/u apt with Dr.Ganji on 12/21/2023

## 2023-12-12 NOTE — Progress Notes (Signed)
 Patient ID: Franklin Jones                 DOB: 01-03-1951                      MRN: [id number]     HPI: Franklin Jones is a 73 y.o. male referred by Dr. Jacinto Halim to pharmacy clinic for HF medication management. PMH is significant for hypertension, stage III chronic kidney disease, hyperlipidemia, coronary artery disease,HFrEF. Most recent LVEF 25% on 08/08/2023. complete occlusion of proximal LAD to mid LAD and acute marginal of RCA -12/28/2017,S/P Mid LAD PCI in June 2019 with improvement in EF to 45%. However recent echocardiogram in January 24 revealed EF 30 to 35%, and had developed new left bundle branch block, underwent cardiac catheterization on 11/23/2022 revealing widely patent LAD stent.  Entresto dose was increased to 49-51 mg twice daily -pt was unable to tolerate due to hypotension, dizziness and tiredness. Dose was reduce back to 24-26 mg twice daily.  At last visit with PharmD London Pepper was added to other GDMT agent. Little reduction in renal function noted but f/u lab showed improvement in renal function. Last visit home BP monitor validated reads accurately.    Patient presented today for follow up. Reports he feels great. Initial couple weeks after starting Jardiance he did not feel well but now he feels more energetic. Reports his home BP ~ 95-110/ 65-70 heart rate 60-70. Dizziness only when move around fast, denies swelling, SOB, chest pain or palpitation. Able to complete all ADLs. He checks his weight at home; stable  Denies LEE, PND, or orthopnea. Appetite has been normal. he adheres to a low-salt diet. We will get updated BMP today before we initiate spironolactone. If K level WNL and renal function back to baseline we will initiate low dose spironolactone   Current CHF meds: carvedilol 12.5 mg twice daily, Entresto 24-26 mg twice daily, Jardiance 10 mg daily    Previously tried: losartan 25 mg daily  Adherence Assessment  Do you ever forget to take your medication? [] Yes [x] No  Do  you ever skip doses due to side effects? [] Yes [x] No  Do you have trouble affording your medicines? [x] Yes [] No  Are you ever unable to pick up your medication due to transportation difficulties? [] Yes [x] No  Do you ever stop taking your medications because you don't believe they are helping? [] Yes [x] No  Do you check your weight daily?stable in 208-210 lbs range  [x] Yes [] No   Adherence strategy: pill box  Barriers to obtaining medications: none  BP goal: <130/80   Family History:   Social History:  Alcohol: 4 drinks per week  Smoking: quit 30 years ago    Diet: low salt healthy home cooked meals   Exercise: 2-3 miles walks 5 days a week   Home BP readings:  90-110 /60-80 ht high 50's   Wt Readings from Last 3 Encounters:  12/12/23 214 lb (97.1 kg)  10/31/23 108 lb (49 kg)  10/17/23 213 lb 6.4 oz (96.8 kg)   BP Readings from Last 3 Encounters:  12/12/23 (!) 134/90  10/31/23 120/68  10/17/23 120/82   Pulse Readings from Last 3 Encounters:  12/12/23 (!) 57  10/31/23 67  10/17/23 65    Renal function: Estimated Creatinine Clearance: 52.8 mL/min (A) (by C-G formula based on SCr of 1.53 mg/dL (H)).  Past Medical History:  Diagnosis Date   Acute on chronic combined systolic and diastolic CHF (congestive heart  failure) (HCC) 12/15/2017   Acute on chronic heart failure (HCC) 12/15/2017   Allergic rhinitis    Arthritis    "qwhere" (6//19/2019)   Asbestosis(501)    followed by Dr Mayra Reel- "on monitoring only; stable x years" (04/05/2018)   Basal cell carcinoma    "face; head" (04/05/2018)   Degenerative arthritis of knee 11/19/2011   Dyspnea on exertion 02/01/2019   GERD (gastroesophageal reflux disease)    High cholesterol    History of asbestos exposure    followed by Dr Ortencia Kick- states stable years- last CT chest 7/12 in Epic   Hypertension    LBBB (left bundle branch block) 01/31/2018   Shortness of breath    SOME SOB BUT CAN CLIMB FLIGHT OF STAIRS    Squamous carcinoma    "back of neck" (04/05/2018)    Current Outpatient Medications on File Prior to Visit  Medication Sig Dispense Refill   aspirin EC 81 MG tablet Take 1 tablet (81 mg total) by mouth daily. (Patient taking differently: Take 81 mg by mouth every evening.) 90 tablet 3   carvedilol (COREG) 12.5 MG tablet take (1) tablet twice daily. 180 tablet 3   empagliflozin (JARDIANCE) 10 MG TABS tablet Take 1 tablet (10 mg total) by mouth daily before breakfast. 30 tablet 11   fexofenadine (ALLEGRA) 180 MG tablet Take 180 mg by mouth in the morning.     Multiple Vitamins-Minerals (CENTRUM SILVER 50+MEN) TABS Take 1 tablet by mouth in the morning.     nitroGLYCERIN (NITROSTAT) 0.4 MG SL tablet place (1) tablet under tongue every 5 minutes up to (3) doses. if no relief call 911. 25 tablet 0   rosuvastatin (CRESTOR) 10 MG tablet take 1 tablet once daily. 90 tablet 1   sacubitril-valsartan (ENTRESTO) 24-26 MG Take 1 tablet by mouth 2 (two) times daily. 60 tablet 11   sildenafil (VIAGRA) 100 MG tablet Take 100 mg by mouth daily as needed for erectile dysfunction.     VENTOLIN HFA 108 (90 Base) MCG/ACT inhaler INHALE 2 PUFFS EVERY 4 HOURS AS NEEDED FOR WHEEZING OR SHORTNESS OF BREATH. 18 g 3   [DISCONTINUED] sacubitril-valsartan (ENTRESTO) 24-26 MG Take 1 tablet by mouth 2 (two) times daily.     No current facility-administered medications on file prior to visit.    Allergies  Allergen Reactions   Penicillins Rash    Has patient had a PCN reaction causing immediate rash, facial/tongue/throat swelling, SOB or lightheadedness with hypotension: unkn Has patient had a PCN reaction causing severe rash involving mucus membranes or skin necrosis: unkn Has patient had a PCN reaction that required hospitalization: no Has patient had a PCN reaction occurring within the last 10 years: no If all of the above answers are "NO", then may proceed with Cephalosporin use.      Assessment/Plan:  1. CHF  -  Chronic systolic CHF (congestive heart failure) (HCC) Assessment: In office BP 134/90 heart rate 57 - pt reports he gets stressed at OV   At home BP 90-110 /65-70 heart rate  high 50's  Tolerates Entresto low dose, Carvedilol 12.5 mg twice daily, and Jardiance 10 mg daily   well without any side effects Unable to tolerate moderate dose Entresto due to dizziness, tiredness and hypotension  Last BMP little drop in renal function noted after starting Jardiance  Denies SOB, palpitation, chest pain, headaches,or swelling Dizziness only he changes his position too quick Able to complete all ADLs. Stable weight at home (normal range 214 -  215 lbs),Denies LEE, PND, or orthopnea Appetite has been normal,adheres to a low-salt diet   Plan:  Continue taking  Entresto dose to 24-26 mg twice daily, Jardiance 10 mg, and carvedilol 12.5 mg twice daily  Will get updated BMP today to assess K level and renal function  If K WNL and Renal function back to baseline we will optimize GDMT by adding low dose MRA  Lab: BMP today  F/u apt with Dr.Ganji on 12/21/2023  Chronic systolic heart failure (HCC) -     Basic metabolic panel  Chronic systolic CHF (congestive heart failure) (HCC) Overview:  Current CHF meds: carvedilol 12.5 mg twice daily, Entresto 24-26 mg twice daily, Jardiance 10 mg daily, Jardiance 10 mg daily   Previously tried: losartan 25 mg daily  Diuretic use: none Dry weight: 214 lbs  Adherence strategy: pill box  Barriers to obtaining medications:cost- enrolled in the [employer]   Assessment & Plan: Assessment: In office BP 134/90 heart rate 57 - pt reports he gets stressed at OV   At home BP 90-110 /65-70 heart rate  high 50's  Tolerates Entresto low dose, Carvedilol 12.5 mg twice daily, and Jardiance 10 mg daily   well without any side effects Unable to tolerate moderate dose Entresto due to dizziness, tiredness and hypotension  Last BMP little drop in renal function noted after  starting Jardiance  Denies SOB, palpitation, chest pain, headaches,or swelling Dizziness only he changes his position too quick Able to complete all ADLs. Stable weight at home (normal range 214 - 215 lbs),Denies LEE, PND, or orthopnea Appetite has been normal,adheres to a low-salt diet   Plan:  Continue taking  Entresto dose to 24-26 mg twice daily, Jardiance 10 mg, and carvedilol 12.5 mg twice daily  Will get updated BMP today to assess K level and renal function  If K WNL and Renal function back to baseline we will optimize GDMT by adding low dose MRA  Lab: BMP today  F/u apt with Dr.Ganji on 12/21/2023      Thank you   Carmela Hurt, Pharm.D [facility] Division of [facility] [address], [place], [place] [address]  Phone: [phone]; Fax: [phone]

## 2023-12-12 NOTE — Patient Instructions (Signed)
 No medications changes made by your pharmacist Carmela Hurt, PharmD at today's visit:  Get updated BMP today will add Spironolactone 12.5 mg if lab is WNL      HOW TO TAKE YOUR BLOOD PRESSURE AT HOME  Rest 5 minutes before taking your blood pressure.  Don't smoke or drink caffeinated beverages for at least 30 minutes before. Take your blood pressure before (not after) you eat. Sit comfortably with your back supported and both feet on the floor (don't cross your legs). Elevate your arm to heart level on a table or a desk. Use the proper sized cuff. It should fit smoothly and snugly around your bare upper arm. There should be enough room to slip a fingertip under the cuff. The bottom edge of the cuff should be 1 inch above the crease of the elbow. Ideally, take 3 measurements at one sitting and record the average.  Important lifestyle changes to control high blood pressure  Intervention  Effect on the BP  Lose extra pounds and watch your waistline Weight loss is one of the most effective lifestyle changes for controlling blood pressure. If you're overweight or obese, losing even a small amount of weight can help reduce blood pressure. Blood pressure might go down by about 1 millimeter of mercury (mm Hg) with each kilogram (about 2.2 pounds) of weight lost.  Exercise regularly As a general goal, aim for at least 30 minutes of moderate physical activity every day. Regular physical activity can lower high blood pressure by about 5 to 8 mm Hg.  Eat a healthy diet Eating a diet rich in whole grains, fruits, vegetables, and low-fat dairy products and low in saturated fat and cholesterol. A healthy diet can lower high blood pressure by up to 11 mm Hg.  Reduce salt (sodium) in your diet Even a small reduction of sodium in the diet can improve heart health and reduce high blood pressure by about 5 to 6 mm Hg.  Limit alcohol One drink equals 12 ounces of beer, 5 ounces of wine, or 1.5 ounces of  80-proof liquor.  Limiting alcohol to less than one drink a day for women or two drinks a day for men can help lower blood pressure by about 4 mm Hg.   If you have any questions or concerns please use My Chart to send questions or call the office at [phone]

## 2023-12-21 ENCOUNTER — Encounter: Payer: Self-pay | Admitting: Cardiology

## 2023-12-21 ENCOUNTER — Ambulatory Visit: Payer: Medicare Other | Attending: Cardiology | Admitting: Cardiology

## 2023-12-21 VITALS — BP 138/83 | HR 62 | Resp 16 | Ht 73.0 in | Wt 211.2 lb

## 2023-12-21 DIAGNOSIS — I5022 Chronic systolic (congestive) heart failure: Secondary | ICD-10-CM

## 2023-12-21 DIAGNOSIS — I447 Left bundle-branch block, unspecified: Secondary | ICD-10-CM | POA: Diagnosis not present

## 2023-12-21 DIAGNOSIS — I25118 Atherosclerotic heart disease of native coronary artery with other forms of angina pectoris: Secondary | ICD-10-CM

## 2023-12-21 MED ORDER — SPIRONOLACTONE 25 MG PO TABS: 25.0000 mg | ORAL_TABLET | Freq: Every day | ORAL | 2 refills | Status: DC

## 2023-12-21 NOTE — Patient Instructions (Signed)
 Medication Instructions:  Your physician has recommended you make the following change in your medication: Start spironolactone 25 mg by mouth daily   *If you need a refill on your cardiac medications before your next appointment, please call your pharmacy*   Lab Work: Have lab work (BMP) checked in about 3 weeks.  BMP.  This is not fasting.  Can be done at any [facility].  There is an office on the first floor of our building If you have labs (blood work) drawn today and your tests are completely normal, you will receive your results only by: MyChart Message (if you have MyChart) OR A paper copy in the mail If you have any lab test that is abnormal or we need to change your treatment, we will call you to review the results.   Testing/Procedures: Your physician has requested that you have an echocardiogram in 2 months. Echocardiography is a painless test that uses sound waves to create images of your heart. It provides your doctor with information about the size and shape of your heart and how well your heart's chambers and valves are working. This procedure takes approximately one hour. There are no restrictions for this procedure. Please do NOT wear cologne, perfume, aftershave, or lotions (deodorant is allowed). Please arrive 15 minutes prior to your appointment time.  Please note: We ask at that you not bring children with you during ultrasound (echo/ vascular) testing. Due to room size and safety concerns, children are not allowed in the ultrasound rooms during exams. Our front office staff cannot provide observation of children in our lobby area while testing is being conducted. An adult accompanying a patient to their appointment will only be allowed in the ultrasound room at the discretion of the ultrasound technician under special circumstances. We apologize for any inconvenience.    Follow-Up: At [facility], you and your health needs are our priority.  As part of our  continuing mission to provide you with exceptional heart care, we have created designated Provider Care Teams.  These Care Teams include your primary Cardiologist (physician) and Advanced Practice Providers (APPs -  Physician Assistants and Nurse Practitioners) who all work together to provide you with the care you need, when you need it.  We recommend signing up for the patient portal called "MyChart".  Sign up information is provided on this After Visit Summary.  MyChart is used to connect with patients for Virtual Visits (Telemedicine).  Patients are able to view lab/test results, encounter notes, upcoming appointments, etc.  Non-urgent messages can be sent to your provider as well.   To learn more about what you can do with MyChart, go to [email or web address].    Your next appointment:   4 month(s)  Provider:   Yates Decamp, MD     Other Instructions

## 2023-12-21 NOTE — Progress Notes (Signed)
 Cardiology Office Note:  .   Date:  12/21/2023  ID:  Franklin Jones, DOB 1951-08-08, MRN [id number] PCP: Franklin Specking, MD   HeartCare Providers Cardiologist:  Franklin Decamp, MD Electrophysiologist:  Franklin Small, MD   History of Present Illness: Franklin Jones Kitchen   Franklin Jones is a 73 y.o. Caucasian male with hypertension, stage III chronic kidney disease, hyperlipidemia, coronary artery disease, complete occlusion of proximal LAD to mid LAD and acute marginal of RCA by coronary angiography on 12/28/2017 at which time his EF was 20-25% and initially presented with florid CHF. He is S/P Mid LAD PCI in June 2019 with improvement in EF to 45%. However recent echocardiogram in January 24 revealed EF 30 to 35%, and had developed new left bundle branch block, underwent cardiac catheterization on 11/23/2022 revealing widely patent LAD stent.  Discussed the use of AI scribe software for clinical note transcription with the patient, who gave verbal consent to proceed.  History of Present Illness   The patient, with a history of heart disease, He has noticed a slight weight loss, fluctuating by three to four pounds, but does not express concern about this change.  The patient denies experiencing chest pain and reports no issues with his breathing. He has been able to resume his normal activities without experiencing dizziness, except for occasional instances when standing up quickly from a bent position.  The patient has been taking Entresto for his heart condition, but has not been able to increase the dosage due to a drop in blood pressure. He expresses willingness to try increasing the dosage slowly, under the doctor's guidance.  In addition to his heart condition, the patient mentions a previous discussion about a pacemaker, but he has not yet decided to proceed with this treatment. He expresses satisfaction with his current condition and symptom management, and wishes to continue monitoring his heart function  with medication before making a decision about the pacemaker.      Labs   Lab Results  Component Value Date   CHOL 106 08/16/2019   HDL 43 08/16/2019   LDLCALC 47 08/16/2019   LDLDIRECT 55 08/16/2019   TRIG 78 08/16/2019   Lab Results  Component Value Date   NA 141 12/12/2023   K 5.2 12/12/2023   CO2 23 12/12/2023   GLUCOSE 102 (H) 12/12/2023   BUN 25 12/12/2023   CREATININE 1.36 (H) 12/12/2023   CALCIUM 9.6 12/12/2023   EGFR 55 (L) 12/12/2023   GFRNONAA 46 (L) 04/03/2020      Latest Ref Rng & Units 12/12/2023   10:31 AM 11/22/2023    9:45 AM 11/16/2023   10:38 AM  BMP  Glucose 70 - 99 mg/dL 213  086  88   BUN 8 - 27 mg/dL 25  23  24    Creatinine 0.76 - 1.27 mg/dL 5.78  4.69  6.29   BUN/Creat Ratio 10 - 24 18  15  17    Sodium 134 - 144 mmol/L 141  141  141   Potassium 3.5 - 5.2 mmol/L 5.2  4.7  5.5   Chloride 96 - 106 mmol/L 107  104  104   CO2 20 - 29 mmol/L 23  21  20    Calcium 8.6 - 10.2 mg/dL 9.6  9.7  9.7       Latest Ref Rng & Units 11/16/2022    3:17 PM 01/25/2019    8:16 AM 04/06/2018    2:42 AM  CBC  WBC 3.4 -  10.8 x10E3/uL 6.8  6.1  7.0   Hemoglobin 13.0 - 17.7 g/dL 40.9  81.1  91.4   Hematocrit 37.5 - 51.0 % 42.2  41.8  36.5   Platelets 150 - 450 x10E3/uL 195  188  151    No results found for: "HGBA1C"  Lab Results  Component Value Date   TSH 3.680 08/16/2019    Review of Systems  Cardiovascular:  Negative for chest pain, dyspnea on exertion and leg swelling.   Physical Exam:   VS:  BP 138/83 (BP Location: Left Arm, Patient Position: Sitting, Cuff Size: Normal)   Pulse 62   Resp 16   Ht 6\' 1"  (1.854 m)   Wt 211 lb 3.2 oz (95.8 kg)   SpO2 97%   BMI 27.86 kg/m    Wt Readings from Last 3 Encounters:  12/21/23 211 lb 3.2 oz (95.8 kg)  12/12/23 214 lb (97.1 kg)  10/31/23 108 lb (49 kg)    Physical Exam Neck:     Vascular: No carotid bruit or JVD.  Cardiovascular:     Rate and Rhythm: Normal rate and regular rhythm.     Pulses: Intact distal  pulses.     Heart sounds: Normal heart sounds. No murmur heard.    No gallop.  Pulmonary:     Effort: Pulmonary effort is normal.     Breath sounds: Normal breath sounds.  Abdominal:     General: Bowel sounds are normal.     Palpations: Abdomen is soft.  Musculoskeletal:     Right lower leg: No edema.     Left lower leg: No edema.    Studies Reviewed: Franklin Jones Kitchen    Left Heart Catheterization 11/23/22:   The previously placed overlapping 2.5 x 2402.25 x 38 mm Synergy DES on 04/05/2018 are widely patent.  RCA: Large-caliber vessel, it is occluded after the origin of RV branch.  There is ipsilateral collaterals from the conus branch to the distal right and also from the LAD to the distal right.  Short segment occlusion.  Unchanged from prior cardiac catheterization, but the collaterals appear to be more robust.      Impression: Patient stress test abnormality in the anterior wall is probably related to underlying LBBB.  As the LAD stent is widely patent, symptoms of angina are very minimal class II, has robust collaterals to the distal right coronary artery, although the mid RCA stenosis appears to be conducive for angioplasty, will continue medical therapy.   ECHOCARDIOGRAM COMPLETE 08/08/2023  1. Left ventricular ejection fraction, by estimation, is 25%. The left ventricle has severely decreased function. The left ventricle demonstrates global hypokinesis with septal-lateral dyssynchrony consistent with LBBB and Jones apical aneurysm. There was no LV thrombus visualized. The left ventricular internal cavity size was mildly dilated. Left ventricular diastolic parameters are consistent with Grade I diastolic dysfunction (impaired relaxation). 2. Right ventricular systolic function is mildly reduced. The right ventricular size is normal. Tricuspid regurgitation signal is inadequate for assessing PA pressure.   EKG:         Medications and allergies    Allergies  Allergen Reactions    Penicillins Rash    Has patient had a PCN reaction causing immediate rash, facial/tongue/throat swelling, SOB or lightheadedness with hypotension: unkn Has patient had a PCN reaction causing severe rash involving mucus membranes or skin necrosis: unkn Has patient had a PCN reaction that required hospitalization: no Has patient had a PCN reaction occurring within the last 10 years: no If all  of the above answers are "NO", then may proceed with Cephalosporin use.      Current Outpatient Medications:    aspirin EC 81 MG tablet, Take 1 tablet (81 mg total) by mouth daily. (Patient taking differently: Take 81 mg by mouth every evening.), Disp: 90 tablet, Rfl: 3   carvedilol (COREG) 12.5 MG tablet, take (1) tablet twice daily., Disp: 180 tablet, Rfl: 3   empagliflozin (JARDIANCE) 10 MG TABS tablet, Take 1 tablet (10 mg total) by mouth daily before breakfast., Disp: 30 tablet, Rfl: 11   fexofenadine (ALLEGRA) 180 MG tablet, Take 180 mg by mouth in the morning., Disp: , Rfl:    Multiple Vitamins-Minerals (CENTRUM SILVER 50+MEN) TABS, Take 1 tablet by mouth in the morning., Disp: , Rfl:    nitroGLYCERIN (NITROSTAT) 0.4 MG SL tablet, place (1) tablet under tongue every 5 minutes up to (3) doses. if no relief call 911., Disp: 25 tablet, Rfl: 0   rosuvastatin (CRESTOR) 10 MG tablet, take 1 tablet once daily., Disp: 90 tablet, Rfl: 1   sacubitril-valsartan (ENTRESTO) 24-26 MG, Take 1 tablet by mouth 2 (two) times daily., Disp: 60 tablet, Rfl: 11   sildenafil (VIAGRA) 100 MG tablet, Take 100 mg by mouth daily as needed for erectile dysfunction., Disp: , Rfl:    spironolactone (ALDACTONE) 25 MG tablet, Take 1 tablet (25 mg total) by mouth daily., Disp: 30 tablet, Rfl: 2   VENTOLIN HFA 108 (90 Base) MCG/ACT inhaler, INHALE 2 PUFFS EVERY 4 HOURS AS NEEDED FOR WHEEZING OR SHORTNESS OF BREATH., Disp: 18 g, Rfl: 3   ASSESSMENT AND PLAN: .      ICD-10-CM   1. Coronary artery disease of native artery of native  heart with stable angina pectoris (HCC)  I25.118     2. Chronic systolic heart failure (HCC)  Z61.09 spironolactone (ALDACTONE) 25 MG tablet    3. LBBB (left bundle branch block)  I44.7       1. Coronary artery disease of native artery of native heart with stable angina pectoris [facility] Patient with known ischemic or myopathy and also combination of nonischemic cardiomyopathy, as all his LVEF cannot be explained by coronary artery disease alone, presents here for a 15-month office visit, he has been evaluated by Dr. Lalla Brothers and did not want to proceed with BiV ICD/pacemaker implantation.  He is also being followed by pharmacy team to uptitrate his Sherryll Burger however he has not been able to tolerate more than low-dose Entresto.  This is due to low blood pressure.  He has not had any anginal symptoms.  He is presently on carvedilol 12.5 mg twice daily, continue the same.  2. Chronic systolic heart failure (HCC) He is on Entresto 24/26 mg twice daily, I will add spironolactone 25 mg in the morning, will obtain BMP and BNP in 2 to 3 weeks.  He has an appointment to follow-up with pharmacist in about 2 to 3 weeks as well [facility] will follow-up with renal function profile.  I will repeat echocardiogram in 2 months and see him back in about 3 to 4 months for follow-up.  3. LBBB (left bundle branch block) Patient has underlying left bundle branch block, he would certainly fit the criteria for BiV ICD implantation as previously he has had some shortness of breath and also clinical evidence of heart failure with mild leg edema, however now he is well compensated, patient wants to think about device placement.  Will continue optimization of his medical therapy, and follow-up closely.  Signed,  Franklin Decamp, MD, [facility] 12/21/2023, 11:01 AM [facility] [address] [place], [place] [address] Phone: [phone]. Fax:  [phone]

## 2024-01-25 DIAGNOSIS — I25118 Atherosclerotic heart disease of native coronary artery with other forms of angina pectoris: Secondary | ICD-10-CM | POA: Diagnosis not present

## 2024-01-25 DIAGNOSIS — I447 Left bundle-branch block, unspecified: Secondary | ICD-10-CM | POA: Diagnosis not present

## 2024-01-25 DIAGNOSIS — I5022 Chronic systolic (congestive) heart failure: Secondary | ICD-10-CM | POA: Diagnosis not present

## 2024-01-26 ENCOUNTER — Encounter: Payer: Self-pay | Admitting: Cardiology

## 2024-01-26 LAB — BASIC METABOLIC PANEL WITH GFR
BUN/Creatinine Ratio: 18 (ref 10–24)
BUN: 27 mg/dL (ref 8–27)
CO2: 21 mmol/L (ref 20–29)
Calcium: 9.4 mg/dL (ref 8.6–10.2)
Chloride: 103 mmol/L (ref 96–106)
Creatinine, Ser: 1.52 mg/dL — ABNORMAL HIGH (ref 0.76–1.27)
Glucose: 89 mg/dL (ref 70–99)
Potassium: 5 mmol/L (ref 3.5–5.2)
Sodium: 137 mmol/L (ref 134–144)
eGFR: 48 mL/min/{1.73_m2} — ABNORMAL LOW (ref >59)

## 2024-01-26 NOTE — Progress Notes (Signed)
 Stable S. Creatinine Continue uptitration of GDP for systolic heart failure

## 2024-02-06 ENCOUNTER — Ambulatory Visit: Payer: Medicare Other | Attending: Internal Medicine | Admitting: Pharmacist

## 2024-02-06 ENCOUNTER — Encounter: Payer: Self-pay | Admitting: Pharmacist

## 2024-02-06 VITALS — BP 105/62 | HR 61 | Ht 73.0 in | Wt 211.2 lb

## 2024-02-06 DIAGNOSIS — I5022 Chronic systolic (congestive) heart failure: Secondary | ICD-10-CM

## 2024-02-06 MED ORDER — CARVEDILOL 25 MG PO TABS: 25.0000 mg | ORAL_TABLET | Freq: Two times a day (BID) | ORAL | 3 refills | Status: AC

## 2024-02-06 NOTE — Assessment & Plan Note (Signed)
 Assessment: In office BP 105/62 heart rate 61  At home BP 90-110 /65-70 heart rate  high 50's  Tolerates Entresto  low dose, Carvedilol  12.5 mg twice daily, and Jardiance  10 mg daily  and Spironolactone  25 mg daily  well without any side effects In the past he was unable to tolerate moderate dose Entresto  due to dizziness, tiredness and hypotension  Denies SOB, palpitation, chest pain, headaches,or swelling Dizziness only he changes his position too quick Able to complete all ADLs. Stable weight at home (normal range 214 - 215 lbs),Denies LEE, PND, or orthopnea Appetite has been normal,adheres to a low-salt diet   Plan:  Increase Carvedilol  dose from 12.5 mg twice daily to 25 mg twice daily  Continue taking Entresto  24-26 mg twice daily, Jardiance  10 mg daily, spironolactone  25 mg daily Will get updated BMP today to assess K level and renal function as last BMP renal function went down from spironolactone  addition  Lab: BMP today  Will be having ECHO mid May  F/u with PharmD on 04/02/2024

## 2024-02-06 NOTE — Progress Notes (Signed)
 Patient ID: Franklin Jones                 DOB: 06-Oct-1951                      MRN: [id number]     HPI: Franklin Jones is a 73 y.o. male referred by Dr. Berry Bristol to pharmacy clinic for HF medication management. PMH is significant for hypertension, stage III chronic kidney disease, hyperlipidemia, coronary artery disease,HFrEF. Most recent LVEF 25% on 08/08/2023. complete occlusion of proximal LAD to mid LAD and acute marginal of RCA -12/28/2017,S/P Mid LAD PCI in June 2019 with improvement in EF to 45%. However recent echocardiogram in January 24 revealed EF 30 to 35%, and had developed new left bundle branch block, underwent cardiac catheterization on 11/23/2022 revealing widely patent LAD stent.  Entresto  dose was increased to 49-51 mg twice daily -pt was unable to tolerate due to hypotension, dizziness and tiredness. Dose was reduce back to 24-26 mg twice daily.  At last visit with PharmD Jardiance  was added to other GDMT agent. Little reduction in renal function noted but f/u lab showed improvement in renal function. Last visit home BP monitor validated reads accurately.    Patient last seen in clinic on 12/12/23. Reported he feels great. Feels feels more energetic on Jardiance . Reported his home BP ~ 95-110/ 65-70 heart rate 60-70. Dizziness only when moving around fast, denies swelling, SOB, chest pain or palpitation. Able to complete all ADLs. He checks his weight at home; stable  Denies LEE, PND, or orthopnea. Appetite has been normal. he adheres to a low-salt diet. Per BMP, K level WNL and renal function back to baseline, spironolactone  25 mg initiated.  Repeat BMP after 2 weeks of spironolactone  shows elevated Scr 1.52 up from 1.36. Patient had similar response to Jardiance  in the past. Transient increases in SCr are expected with initiation of this med. K+ 5.0, has been historically on high end of normal.  Increase coreg  BMP again today   Current CHF meds: carvedilol  12.5 mg twice daily, Entresto   24-26 mg twice daily, Jardiance  10 mg daily, spironolactone  25 mg daily Previously tried: losartan  25 mg daily  Adherence Assessment  Do you ever forget to take your medication? [] Yes [x] No  Do you ever skip doses due to side effects? [] Yes [x] No  Do you have trouble affording your medicines? [x] Yes [] No  Are you ever unable to pick up your medication due to transportation difficulties? [] Yes [x] No  Do you ever stop taking your medications because you don't believe they are helping? [] Yes [x] No  Do you check your weight daily?stable in 208-210 lbs range  [x] Yes [] No   Adherence strategy: pill box  Barriers to obtaining medications: none  BP goal: <130/80   Family History:   Social History:  Alcohol : 4 drinks per week  Smoking: quit 30 years ago    Diet: low salt healthy home cooked meals   Exercise: 2-3 miles walks 5 days a week   Home BP readings:  90-110 /60-80 ht high 50's   Wt Readings from Last 3 Encounters:  12/21/23 211 lb 3.2 oz (95.8 kg)  12/12/23 214 lb (97.1 kg)  10/31/23 108 lb (49 kg)   BP Readings from Last 3 Encounters:  12/21/23 138/83  12/12/23 (!) 134/90  10/31/23 120/68   Pulse Readings from Last 3 Encounters:  12/21/23 62  12/12/23 (!) 57  10/31/23 67    Renal function: CrCl cannot be calculated (  Unknown ideal weight.).  Past Medical History:  Diagnosis Date   Acute on chronic combined systolic and diastolic CHF (congestive heart failure) (HCC) 12/15/2017   Acute on chronic heart failure (HCC) 12/15/2017   Allergic rhinitis    Arthritis    "qwhere" (6//19/2019)   Asbestosis(501)    followed by Dr Cathaleen Clinton- "on monitoring only; stable x years" (04/05/2018)   Basal cell carcinoma    "face; head" (04/05/2018)   Degenerative arthritis of knee 11/19/2011   Dyspnea on exertion 02/01/2019   GERD (gastroesophageal reflux disease)    High cholesterol    History of asbestos exposure    followed by Dr Sherlie Distance- states stable years- last CT  chest 7/12 in Epic   Hypertension    LBBB (left bundle branch block) 01/31/2018   Shortness of breath    SOME SOB BUT CAN CLIMB FLIGHT OF STAIRS   Squamous carcinoma    "back of neck" (04/05/2018)    Current Outpatient Medications on File Prior to Visit  Medication Sig Dispense Refill   aspirin  EC 81 MG tablet Take 1 tablet (81 mg total) by mouth daily. (Patient taking differently: Take 81 mg by mouth every evening.) 90 tablet 3   carvedilol  (COREG ) 12.5 MG tablet take (1) tablet twice daily. 180 tablet 3   empagliflozin  (JARDIANCE ) 10 MG TABS tablet Take 1 tablet (10 mg total) by mouth daily before breakfast. 30 tablet 11   fexofenadine (ALLEGRA) 180 MG tablet Take 180 mg by mouth in the morning.     Multiple Vitamins-Minerals (CENTRUM SILVER 50+MEN) TABS Take 1 tablet by mouth in the morning.     nitroGLYCERIN  (NITROSTAT ) 0.4 MG SL tablet place (1) tablet under tongue every 5 minutes up to (3) doses. if no relief call 911. 25 tablet 0   rosuvastatin  (CRESTOR ) 10 MG tablet take 1 tablet once daily. 90 tablet 1   sacubitril -valsartan  (ENTRESTO ) 24-26 MG Take 1 tablet by mouth 2 (two) times daily. 60 tablet 11   sildenafil (VIAGRA) 100 MG tablet Take 100 mg by mouth daily as needed for erectile dysfunction.     spironolactone  (ALDACTONE ) 25 MG tablet Take 1 tablet (25 mg total) by mouth daily. 30 tablet 2   VENTOLIN  HFA 108 (90 Base) MCG/ACT inhaler INHALE 2 PUFFS EVERY 4 HOURS AS NEEDED FOR WHEEZING OR SHORTNESS OF BREATH. 18 g 3   [DISCONTINUED] sacubitril -valsartan  (ENTRESTO ) 24-26 MG Take 1 tablet by mouth 2 (two) times daily.     No current facility-administered medications on file prior to visit.    Allergies  Allergen Reactions   Penicillins Rash    Has patient had a PCN reaction causing immediate rash, facial/tongue/throat swelling, SOB or lightheadedness with hypotension: unkn Has patient had a PCN reaction causing severe rash involving mucus membranes or skin necrosis: unkn Has  patient had a PCN reaction that required hospitalization: no Has patient had a PCN reaction occurring within the last 10 years: no If all of the above answers are "NO", then may proceed with Cephalosporin use.      Assessment/Plan:  1. CHF -  No problem-specific Assessment & Plan notes found for this encounter.  There are no diagnoses linked to this encounter.   Thank you   Nickola Baron, Pharm.D [facility] [address] [place], [place] [address]  Phone: [phone]; Fax: [phone]

## 2024-02-06 NOTE — Progress Notes (Signed)
 Patient ID: Franklin Jones                 DOB: 1951/07/10                      MRN: [id number]     HPI: Franklin Jones is a 73 y.o. male referred by Dr. Berry Bristol to pharmacy clinic for HF medication management. PMH is significant for hypertension, stage III chronic kidney disease, hyperlipidemia, coronary artery disease,HFrEF. Most recent LVEF 25% on 08/08/2023. complete occlusion of proximal LAD to mid LAD and acute marginal of RCA -12/28/2017,S/P Mid LAD PCI in June 2019 with improvement in EF to 45%. However recent echocardiogram in January 24 revealed EF 30 to 35%, and had developed new left bundle branch block, underwent cardiac catheterization on 11/23/2022 revealing widely patent LAD stent.  Entresto  dose was increased to 49-51 mg twice daily -pt was unable to tolerate due to hypotension, dizziness and tiredness. Dose was reduce back to 24-26 mg twice daily.  At last visit with PharmD Jardiance  was added to other GDMT agent. Little reduction in renal function noted but f/u lab showed improvement in renal function. Last visit home BP monitor validated reads accurately.    Patient last seen in clinic on 12/12/23. Reported he feels great. Feels feels more energetic on Jardiance . Reported his home BP ~ 95-110/ 65-70 heart rate 60-70. Dizziness only when moving around fast, denies swelling, SOB, chest pain or palpitation. Able to complete all ADLs. He checks his weight at home; stable  Denies LEE, PND, or orthopnea. Appetite has been normal. he adheres to a low-salt diet. Per BMP, K level WNL and renal function back to baseline, spironolactone  25 mg initiated.  Repeat BMP after 2 weeks of spironolactone  shows elevated Scr 1.52 up from 1.36. Patient had similar response to Jardiance  in the past. Transient increases in SCr are expected with initiation of this med. K+ 5.0, has been historically on high end of normal.  Today patient presented for follow up. Reports he feels great. He get dizzy only when he changes  his position too quick. He still follows healthy low salt diet and walks daily. Denies SOB, swelling, HA or chest pain. He will be going for ECHO next month. He is in agreement to optimize Coreg  dose to max. His home BP ~98-110/70-80 range on validated home monitor.     Current CHF meds: carvedilol  25 mg twice daily, Entresto  24-26 mg twice daily, Jardiance  10 mg daily, spironolactone  25 mg daily Previously tried: losartan  25 mg daily  Adherence Assessment  Do you ever forget to take your medication? [] Yes [x] No  Do you ever skip doses due to side effects? [] Yes [x] No  Do you have trouble affording your medicines? [x] Yes [] No  Are you ever unable to pick up your medication due to transportation difficulties? [] Yes [x] No  Do you ever stop taking your medications because you don't believe they are helping? [] Yes [x] No  Do you check your weight daily?stable in 208-210 lbs range  [x] Yes [] No   Adherence strategy: pill box  Barriers to obtaining medications: none  BP goal: <130/80   Family History:   Social History:  Alcohol : 4 drinks per week  Smoking: quit 30 years ago    Diet: low salt healthy home cooked meals   Exercise: 2-3 miles walks 5 days a week   Home BP readings:  90-110 /60-80 ht high 50's   Wt Readings from Last 3 Encounters:  02/06/24 211  lb 3.2 oz (95.8 kg)  12/21/23 211 lb 3.2 oz (95.8 kg)  12/12/23 214 lb (97.1 kg)   BP Readings from Last 3 Encounters:  02/06/24 105/62  12/21/23 138/83  12/12/23 (!) 134/90   Pulse Readings from Last 3 Encounters:  02/06/24 61  12/21/23 62  12/12/23 (!) 57    Renal function: Estimated Creatinine Clearance: 48.9 mL/min (A) (by C-G formula based on SCr of 1.52 mg/dL (H)).  Past Medical History:  Diagnosis Date   Acute on chronic combined systolic and diastolic CHF (congestive heart failure) (HCC) 12/15/2017   Acute on chronic heart failure (HCC) 12/15/2017   Allergic rhinitis    Arthritis    "qwhere"  (6//19/2019)   Asbestosis(501)    followed by Dr Cathaleen Clinton- "on monitoring only; stable x years" (04/05/2018)   Basal cell carcinoma    "face; head" (04/05/2018)   Degenerative arthritis of knee 11/19/2011   Dyspnea on exertion 02/01/2019   GERD (gastroesophageal reflux disease)    High cholesterol    History of asbestos exposure    followed by Dr Sherlie Distance- states stable years- last CT chest 7/12 in Epic   Hypertension    LBBB (left bundle branch block) 01/31/2018   Shortness of breath    SOME SOB BUT CAN CLIMB FLIGHT OF STAIRS   Squamous carcinoma    "back of neck" (04/05/2018)    Current Outpatient Medications on File Prior to Visit  Medication Sig Dispense Refill   aspirin  EC 81 MG tablet Take 1 tablet (81 mg total) by mouth daily. (Patient taking differently: Take 81 mg by mouth every evening.) 90 tablet 3   empagliflozin  (JARDIANCE ) 10 MG TABS tablet Take 1 tablet (10 mg total) by mouth daily before breakfast. 30 tablet 11   fexofenadine (ALLEGRA) 180 MG tablet Take 180 mg by mouth in the morning.     Multiple Vitamins-Minerals (CENTRUM SILVER 50+MEN) TABS Take 1 tablet by mouth in the morning.     nitroGLYCERIN  (NITROSTAT ) 0.4 MG SL tablet place (1) tablet under tongue every 5 minutes up to (3) doses. if no relief call 911. 25 tablet 0   rosuvastatin  (CRESTOR ) 10 MG tablet take 1 tablet once daily. 90 tablet 1   sacubitril -valsartan  (ENTRESTO ) 24-26 MG Take 1 tablet by mouth 2 (two) times daily. 60 tablet 11   sildenafil (VIAGRA) 100 MG tablet Take 100 mg by mouth daily as needed for erectile dysfunction.     spironolactone  (ALDACTONE ) 25 MG tablet Take 1 tablet (25 mg total) by mouth daily. 30 tablet 2   VENTOLIN  HFA 108 (90 Base) MCG/ACT inhaler INHALE 2 PUFFS EVERY 4 HOURS AS NEEDED FOR WHEEZING OR SHORTNESS OF BREATH. 18 g 3   [DISCONTINUED] sacubitril -valsartan  (ENTRESTO ) 24-26 MG Take 1 tablet by mouth 2 (two) times daily.     No current facility-administered medications on  file prior to visit.    Allergies  Allergen Reactions   Penicillins Rash    Has patient had a PCN reaction causing immediate rash, facial/tongue/throat swelling, SOB or lightheadedness with hypotension: unkn Has patient had a PCN reaction causing severe rash involving mucus membranes or skin necrosis: unkn Has patient had a PCN reaction that required hospitalization: no Has patient had a PCN reaction occurring within the last 10 years: no If all of the above answers are "NO", then may proceed with Cephalosporin use.      Assessment/Plan:  1. CHF -  Chronic systolic CHF (congestive heart failure) (HCC) Assessment: In office  BP 105/62 heart rate 61  At home BP 90-110 /65-70 heart rate  high 50's  Tolerates Entresto  low dose, Carvedilol  12.5 mg twice daily, and Jardiance  10 mg daily  and Spironolactone  25 mg daily  well without any side effects In the past he was unable to tolerate moderate dose Entresto  due to dizziness, tiredness and hypotension  Denies SOB, palpitation, chest pain, headaches,or swelling Dizziness only he changes his position too quick Able to complete all ADLs. Stable weight at home (normal range 214 - 215 lbs),Denies LEE, PND, or orthopnea Appetite has been normal,adheres to a low-salt diet   Plan:  Increase Carvedilol  dose from 12.5 mg twice daily to 25 mg twice daily  Continue taking Entresto  24-26 mg twice daily, Jardiance  10 mg daily, spironolactone  25 mg daily Will get updated BMP today to assess K level and renal function as last BMP renal function went down from spironolactone  addition  Lab: BMP today  Will be having ECHO mid May  F/u with PharmD on 04/02/2024  Chronic systolic heart failure (HCC) -     Basic metabolic panel with GFR  Chronic systolic CHF (congestive heart failure) (HCC) Overview:  Current CHF meds: carvedilol  25 mg twice daily, Entresto  24-26 mg twice daily, Jardiance  10 mg daily, spironolactone  25 mg daily  Previously tried:  losartan  25 mg daily  Diuretic use: none Dry weight: 214 lbs  Adherence strategy: pill box  Barriers to obtaining medications:cost- enrolled in the [facility]   Assessment & Plan: Assessment: In office BP 105/62 heart rate 61  At home BP 90-110 /65-70 heart rate  high 50's  Tolerates Entresto  low dose, Carvedilol  12.5 mg twice daily, and Jardiance  10 mg daily  and Spironolactone  25 mg daily  well without any side effects In the past he was unable to tolerate moderate dose Entresto  due to dizziness, tiredness and hypotension  Denies SOB, palpitation, chest pain, headaches,or swelling Dizziness only he changes his position too quick Able to complete all ADLs. Stable weight at home (normal range 214 - 215 lbs),Denies LEE, PND, or orthopnea Appetite has been normal,adheres to a low-salt diet   Plan:  Increase Carvedilol  dose from 12.5 mg twice daily to 25 mg twice daily  Continue taking Entresto  24-26 mg twice daily, Jardiance  10 mg daily, spironolactone  25 mg daily Will get updated BMP today to assess K level and renal function as last BMP renal function went down from spironolactone  addition  Lab: BMP today  Will be having ECHO mid May  F/u with PharmD on 04/02/2024    Other orders -     Carvedilol ; Take 1 tablet (25 mg total) by mouth 2 (two) times daily.  Dispense: 180 tablet; Refill: 3     Thank you   Nickola Baron, Pharm.D [facility] [address], [place], [place] [address]  Phone: [phone]; Fax: [phone]

## 2024-02-06 NOTE — Patient Instructions (Signed)
 Changes made by your pharmacist Nickola Baron, PharmD at today's visit:    Instructions/Changes  (what do you need to do) Your Notes  (what you did and when you did it)  Increase carvedilol  dose from 12.5 mg twice daily to 25 mg twice daily    Continue taking Entresto  24/26 twice daily, spironolactone  25 mg daily and Jardiance  10 mg daily     Bring all of your meds, your BP cuff and your record of home blood pressures to your next appointment.    HOW TO TAKE YOUR BLOOD PRESSURE AT HOME  Rest 5 minutes before taking your blood pressure.  Don't smoke or drink caffeinated beverages for at least 30 minutes before. Take your blood pressure before (not after) you eat. Sit comfortably with your back supported and both feet on the floor (don't cross your legs). Elevate your arm to heart level on a table or a desk. Use the proper sized cuff. It should fit smoothly and snugly around your bare upper arm. There should be enough room to slip a fingertip under the cuff. The bottom edge of the cuff should be 1 inch above the crease of the elbow. Ideally, take 3 measurements at one sitting and record the average.  Important lifestyle changes to control high blood pressure  Intervention  Effect on the BP  Lose extra pounds and watch your waistline Weight loss is one of the most effective lifestyle changes for controlling blood pressure. If you're overweight or obese, losing even a small amount of weight can help reduce blood pressure. Blood pressure might go down by about 1 millimeter of mercury (mm Hg) with each kilogram (about 2.2 pounds) of weight lost.  Exercise regularly As a general goal, aim for at least 30 minutes of moderate physical activity every day. Regular physical activity can lower high blood pressure by about 5 to 8 mm Hg.  Eat a healthy diet Eating a diet rich in whole grains, fruits, vegetables, and low-fat dairy products and low in saturated fat and cholesterol. A healthy diet can  lower high blood pressure by up to 11 mm Hg.  Reduce salt (sodium) in your diet Even a small reduction of sodium in the diet can improve heart health and reduce high blood pressure by about 5 to 6 mm Hg.  Limit alcohol  One drink equals 12 ounces of beer, 5 ounces of wine, or 1.5 ounces of 80-proof liquor.  Limiting alcohol  to less than one drink a day for women or two drinks a day for men can help lower blood pressure by about 4 mm Hg.   If you have any questions or concerns please use My Chart to send questions or call the office at [phone]

## 2024-02-07 ENCOUNTER — Telehealth: Payer: Self-pay | Admitting: Pharmacist

## 2024-02-07 DIAGNOSIS — I5032 Chronic diastolic (congestive) heart failure: Secondary | ICD-10-CM

## 2024-02-07 LAB — BASIC METABOLIC PANEL WITH GFR
BUN/Creatinine Ratio: 17 (ref 10–24)
BUN: 23 mg/dL (ref 8–27)
CO2: 21 mmol/L (ref 20–29)
Calcium: 9.4 mg/dL (ref 8.6–10.2)
Chloride: 105 mmol/L (ref 96–106)
Creatinine, Ser: 1.39 mg/dL — ABNORMAL HIGH (ref 0.76–1.27)
Glucose: 95 mg/dL (ref 70–99)
Potassium: 5.6 mmol/L — ABNORMAL HIGH (ref 3.5–5.2)
Sodium: 137 mmol/L (ref 134–144)
eGFR: 54 mL/min/{1.73_m2} — ABNORMAL LOW (ref >59)

## 2024-02-07 NOTE — Telephone Encounter (Signed)
 Renal function improving, K level elevated, hold spironolactone  25 mg daily for 1 week. Reduce consumption of potassium rich food. Will repeat BMP in 1 week.   In future may consider restarting Spironolactone  at 12.5 mg dose .

## 2024-02-14 DIAGNOSIS — I5032 Chronic diastolic (congestive) heart failure: Secondary | ICD-10-CM | POA: Diagnosis not present

## 2024-02-15 ENCOUNTER — Encounter: Payer: Self-pay | Admitting: Pharmacist

## 2024-02-15 LAB — BASIC METABOLIC PANEL WITH GFR
BUN/Creatinine Ratio: 15 (ref 10–24)
BUN: 23 mg/dL (ref 8–27)
CO2: 23 mmol/L (ref 20–29)
Calcium: 9.4 mg/dL (ref 8.6–10.2)
Chloride: 105 mmol/L (ref 96–106)
Creatinine, Ser: 1.54 mg/dL — ABNORMAL HIGH (ref 0.76–1.27)
Glucose: 87 mg/dL (ref 70–99)
Potassium: 5.2 mmol/L (ref 3.5–5.2)
Sodium: 138 mmol/L (ref 134–144)
eGFR: 47 mL/min/{1.73_m2} — ABNORMAL LOW (ref >59)

## 2024-02-28 ENCOUNTER — Ambulatory Visit: Payer: Self-pay | Admitting: Cardiology

## 2024-02-28 ENCOUNTER — Ambulatory Visit (HOSPITAL_COMMUNITY): Attending: Cardiovascular Disease

## 2024-02-28 DIAGNOSIS — I5022 Chronic systolic (congestive) heart failure: Secondary | ICD-10-CM | POA: Insufficient documentation

## 2024-02-28 DIAGNOSIS — I25118 Atherosclerotic heart disease of native coronary artery with other forms of angina pectoris: Secondary | ICD-10-CM | POA: Insufficient documentation

## 2024-02-28 LAB — ECHOCARDIOGRAM COMPLETE
Area-P 1/2: 2.53 cm2
S' Lateral: 4.4 cm

## 2024-02-28 MED ORDER — PERFLUTREN LIPID MICROSPHERE
2.0000 mL | INTRAVENOUS | Status: AC | PRN
  Administered 2024-02-28: 2 mL via INTRAVENOUS

## 2024-02-28 NOTE — Progress Notes (Signed)
 His LVEF has remained low and he will need to consider ICD placement and I had discussed this before. Let him know I can discuss more on his visit with me or if he is agreeable, I can set up EP consult

## 2024-02-29 ENCOUNTER — Other Ambulatory Visit: Payer: Self-pay | Admitting: Cardiology

## 2024-02-29 DIAGNOSIS — I25110 Atherosclerotic heart disease of native coronary artery with unstable angina pectoris: Secondary | ICD-10-CM

## 2024-03-01 NOTE — Telephone Encounter (Signed)
 Given his soft BP we can not titrate his HF meds further. Currently on spironolactone  12.5 mg daily, Entresto  24/26 mg twice daily, carvedilol  25 mg twice daily and Jardiance  10 mg daily. Patient is wondering when to see Dr.Ganji if we could schedule appointment with him early June that would give him an idea whether to keep appointment with me for  mid June or not. We will route to Dr. Berry Bristol for further advise

## 2024-03-01 NOTE — Telephone Encounter (Signed)
 Please schedule follow up appointment.  Anytime in July is fine

## 2024-03-06 NOTE — Telephone Encounter (Signed)
 I spoke with patient and scheduled him to see Dr Berry Bristol on July 31

## 2024-03-13 DIAGNOSIS — J069 Acute upper respiratory infection, unspecified: Secondary | ICD-10-CM | POA: Diagnosis not present

## 2024-03-13 DIAGNOSIS — J61 Pneumoconiosis due to asbestos and other mineral fibers: Secondary | ICD-10-CM | POA: Diagnosis not present

## 2024-03-13 DIAGNOSIS — I509 Heart failure, unspecified: Secondary | ICD-10-CM | POA: Diagnosis not present

## 2024-03-13 DIAGNOSIS — N1831 Chronic kidney disease, stage 3a: Secondary | ICD-10-CM | POA: Diagnosis not present

## 2024-03-13 DIAGNOSIS — Z299 Encounter for prophylactic measures, unspecified: Secondary | ICD-10-CM | POA: Diagnosis not present

## 2024-03-13 DIAGNOSIS — I700 Atherosclerosis of aorta: Secondary | ICD-10-CM | POA: Diagnosis not present

## 2024-04-02 ENCOUNTER — Encounter: Payer: Self-pay | Admitting: Pharmacist

## 2024-04-02 ENCOUNTER — Ambulatory Visit: Attending: Cardiovascular Disease | Admitting: Pharmacist

## 2024-04-02 VITALS — BP 112/69 | HR 61 | Ht 73.0 in | Wt 210.0 lb

## 2024-04-02 DIAGNOSIS — I5022 Chronic systolic (congestive) heart failure: Secondary | ICD-10-CM | POA: Insufficient documentation

## 2024-04-02 NOTE — Patient Instructions (Signed)
 No Changes made to your heart medications by your pharmacist Nickola Baron, PharmD at today's visit:     HOW TO TAKE YOUR BLOOD PRESSURE AT HOME  Rest 5 minutes before taking your blood pressure.  Don't smoke or drink caffeinated beverages for at least 30 minutes before. Take your blood pressure before (not after) you eat. Sit comfortably with your back supported and both feet on the floor (don't cross your legs). Elevate your arm to heart level on a table or a desk. Use the proper sized cuff. It should fit smoothly and snugly around your bare upper arm. There should be enough room to slip a fingertip under the cuff. The bottom edge of the cuff should be 1 inch above the crease of the elbow. Ideally, take 3 measurements at one sitting and record the average.  Important lifestyle changes to control high blood pressure  Intervention  Effect on the BP  Lose extra pounds and watch your waistline Weight loss is one of the most effective lifestyle changes for controlling blood pressure. If you're overweight or obese, losing even a small amount of weight can help reduce blood pressure. Blood pressure might go down by about 1 millimeter of mercury (mm Hg) with each kilogram (about 2.2 pounds) of weight lost.  Exercise regularly As a general goal, aim for at least 30 minutes of moderate physical activity every day. Regular physical activity can lower high blood pressure by about 5 to 8 mm Hg.  Eat a healthy diet Eating a diet rich in whole grains, fruits, vegetables, and low-fat dairy products and low in saturated fat and cholesterol. A healthy diet can lower high blood pressure by up to 11 mm Hg.  Reduce salt (sodium) in your diet Even a small reduction of sodium in the diet can improve heart health and reduce high blood pressure by about 5 to 6 mm Hg.  Limit alcohol  One drink equals 12 ounces of beer, 5 ounces of wine, or 1.5 ounces of 80-proof liquor.  Limiting alcohol  to less than one drink a  day for women or two drinks a day for men can help lower blood pressure by about 4 mm Hg.   If you have any questions or concerns please use My Chart to send questions or call the office at [phone]

## 2024-04-02 NOTE — Assessment & Plan Note (Signed)
 Assessment: In office BP 112/69 heart rate 61  At home BP 90-110 /65-70 heart rate  high 50's  Tolerates Entresto  low dose, Carvedilol  25 mg twice daily, and Jardiance  10 mg daily  and Spironolactone  25 mg daily  well without any side effects In the past he was unable to tolerate moderate dose Entresto  due to dizziness, tiredness and hypotension  Denies SOB, palpitation, chest pain, headaches,or swelling Dizziness only he changes his position too quick Able to complete all ADLs. Stable weight at home (normal range 214 - 215 lbs),Denies LEE, PND, or orthopnea Appetite has been normal,adheres to a low-salt diet   Plan:  Given soft BP and previous intolerance on higher than low dose Entresto  we will not titrate HF meds further   Continue taking carvedilol  25 mg twice daily Entresto  24-26 mg twice daily, Jardiance  10 mg daily, spironolactone  25 mg daily Follow up with Dr. Berry Bristol on May 17 2024

## 2024-04-02 NOTE — Progress Notes (Signed)
 Patient ID: Franklin Jones                 DOB: 1951/03/19                      MRN: [id number]     HPI: Franklin Jones is a 73 y.o. male referred by Dr. Berry Bristol to pharmacy clinic for HF medication management. PMH is significant for hypertension, stage III chronic kidney disease, hyperlipidemia, coronary artery disease,HFrEF. Most recent LVEF 25% on 08/08/2023. complete occlusion of proximal LAD to mid LAD and acute marginal of RCA -12/28/2017,S/P Mid LAD PCI in June 2019 with improvement in EF to 45%. However recent echocardiogram in January 24 revealed EF 30 to 35%, and had developed new left bundle branch block, underwent cardiac catheterization on 11/23/2022 revealing widely patent LAD stent.  Entresto  dose was increased to 49-51 mg twice daily -pt was unable to tolerate due to hypotension, dizziness and tiredness. Dose was reduce back to 24-26 mg twice daily.  At last visit with PharmD Jardiance  was added to other GDMT agent. Little reduction in renal function noted but f/u lab showed improvement in renal function. Last visit home BP monitor validated reads accurately.    Patient last seen in clinic on 12/12/23. Reported he feels great. Feels feels more energetic on Jardiance . Reported his home BP ~ 95-110/ 65-70 heart rate 60-70. Dizziness only when moving around fast, denies swelling, SOB, chest pain or palpitation. Able to complete all ADLs. He checks his weight at home; stable  Denies LEE, PND, or orthopnea. Appetite has been normal. he adheres to a low-salt diet. Per BMP, K level WNL and renal function back to baseline, spironolactone  25 mg initiated.  Repeat BMP after 2 weeks of spironolactone  shows elevated Scr 1.52 up from 1.36. Patient had similar response to Jardiance  in the past. Transient increases in SCr are expected with initiation of this med. K+ 5.0, has been historically on high end of normal.  Today patient presented for follow up. Reports he feels great. He get dizzy only when he changes  his position too quick. He still follows healthy low salt diet and walks daily. Denies SOB, swelling, HA or chest pain.His home BP ~98-110/70-80 range on validated home monitor.  He has appointment  with Dr. Berry Bristol end of July. As his LV EF did not changed much in recent ECHO he does not want to make any medication changes also he did not tolerate Entresto  dose titration well in the past.    Current CHF meds: carvedilol  25 mg twice daily, Entresto  24-26 mg twice daily, Jardiance  10 mg daily, spironolactone  25 mg daily Previously tried: losartan  25 mg daily  Adherence Assessment  Do you ever forget to take your medication? [] Yes [x] No  Do you ever skip doses due to side effects? [] Yes [x] No  Do you have trouble affording your medicines? [x] Yes [] No  Are you ever unable to pick up your medication due to transportation difficulties? [] Yes [x] No  Do you ever stop taking your medications because you don't believe they are helping? [] Yes [x] No  Do you check your weight daily?stable in 208-210 lbs range  [x] Yes [] No   Adherence strategy: pill box  Barriers to obtaining medications: none  BP goal: <130/80   Family History:   Social History:  Alcohol : 4 drinks per week  Smoking: quit 30 years ago    Diet: low salt healthy home cooked meals   Exercise: 2-3 miles walks 5 days a  week   Home BP readings:  90-110 /60-80 ht high 50's   Wt Readings from Last 3 Encounters:  04/02/24 210 lb (95.3 kg)  02/06/24 211 lb 3.2 oz (95.8 kg)  12/21/23 211 lb 3.2 oz (95.8 kg)   BP Readings from Last 3 Encounters:  04/02/24 112/69  02/06/24 105/62  12/21/23 138/83   Pulse Readings from Last 3 Encounters:  04/02/24 61  02/06/24 61  12/21/23 62    Renal function: CrCl cannot be calculated (Patient's most recent lab result is older than the maximum 21 days allowed.).  Past Medical History:  Diagnosis Date   Acute on chronic combined systolic and diastolic CHF (congestive heart failure)  (HCC) 12/15/2017   Acute on chronic heart failure (HCC) 12/15/2017   Allergic rhinitis    Arthritis    qwhere (6//19/2019)   Asbestosis(501)    followed by Dr Cathaleen Clinton- on monitoring only; stable x years (04/05/2018)   Basal cell carcinoma    face; head (04/05/2018)   Degenerative arthritis of knee 11/19/2011   Dyspnea on exertion 02/01/2019   GERD (gastroesophageal reflux disease)    High cholesterol    History of asbestos exposure    followed by Dr Sherlie Distance- states stable years- last CT chest 7/12 in Epic   Hypertension    LBBB (left bundle branch block) 01/31/2018   Shortness of breath    SOME SOB BUT CAN CLIMB FLIGHT OF STAIRS   Squamous carcinoma    back of neck (04/05/2018)    Current Outpatient Medications on File Prior to Visit  Medication Sig Dispense Refill   aspirin  EC 81 MG tablet Take 1 tablet (81 mg total) by mouth daily. (Patient taking differently: Take 81 mg by mouth every evening.) 90 tablet 3   carvedilol  (COREG ) 25 MG tablet Take 1 tablet (25 mg total) by mouth 2 (two) times daily. 180 tablet 3   empagliflozin  (JARDIANCE ) 10 MG TABS tablet Take 1 tablet (10 mg total) by mouth daily before breakfast. 30 tablet 11   fexofenadine (ALLEGRA) 180 MG tablet Take 180 mg by mouth in the morning.     Multiple Vitamins-Minerals (CENTRUM SILVER 50+MEN) TABS Take 1 tablet by mouth in the morning.     nitroGLYCERIN  (NITROSTAT ) 0.4 MG SL tablet place (1) tablet under tongue every 5 minutes up to (3) doses. if no relief call 911. 25 tablet 0   rosuvastatin  (CRESTOR ) 10 MG tablet take 1 tablet once daily. 90 tablet 0   sacubitril -valsartan  (ENTRESTO ) 24-26 MG Take 1 tablet by mouth 2 (two) times daily. 60 tablet 11   sildenafil (VIAGRA) 100 MG tablet Take 100 mg by mouth daily as needed for erectile dysfunction.     spironolactone  (ALDACTONE ) 25 MG tablet Take 0.5 tablets (12.5 mg total) by mouth daily.     VENTOLIN  HFA 108 (90 Base) MCG/ACT inhaler INHALE 2 PUFFS EVERY 4  HOURS AS NEEDED FOR WHEEZING OR SHORTNESS OF BREATH. 18 g 3   [DISCONTINUED] sacubitril -valsartan  (ENTRESTO ) 24-26 MG Take 1 tablet by mouth 2 (two) times daily.     No current facility-administered medications on file prior to visit.    Allergies  Allergen Reactions   Penicillins Rash    Has patient had a PCN reaction causing immediate rash, facial/tongue/throat swelling, SOB or lightheadedness with hypotension: unkn Has patient had a PCN reaction causing severe rash involving mucus membranes or skin necrosis: unkn Has patient had a PCN reaction that required hospitalization: no Has patient had a PCN  reaction occurring within the last 10 years: no If all of the above answers are NO, then may proceed with Cephalosporin use.      Assessment/Plan:  1. CHF -   Chronic systolic CHF (congestive heart failure) (HCC) Overview:  Current CHF meds: carvedilol  25 mg twice daily, Entresto  24-26 mg twice daily, Jardiance  10 mg daily, spironolactone  25 mg daily  Previously tried: losartan  25 mg daily  Diuretic use: none Dry weight: 214 lbs  Adherence strategy: pill box  Barriers to obtaining medications:cost- enrolled in the [facility]   Assessment & Plan: Assessment: In office BP 112/69 heart rate 61  At home BP 90-110 /65-70 heart rate  high 50's  Tolerates Entresto  low dose, Carvedilol  25 mg twice daily, and Jardiance  10 mg daily  and Spironolactone  25 mg daily  well without any side effects In the past he was unable to tolerate moderate dose Entresto  due to dizziness, tiredness and hypotension  Denies SOB, palpitation, chest pain, headaches,or swelling Dizziness only he changes his position too quick Able to complete all ADLs. Stable weight at home (normal range 214 - 215 lbs),Denies LEE, PND, or orthopnea Appetite has been normal,adheres to a low-salt diet   Plan:  Given soft BP and previous intolerance on higher than low dose Entresto  we will not titrate HF meds further    Continue taking carvedilol  25 mg twice daily Entresto  24-26 mg twice daily, Jardiance  10 mg daily, spironolactone  25 mg daily Follow up with Dr. Berry Bristol on May 17 2024     Thank you   Nickola Baron, Pharm.D [facility] of [facility] [address], [place], [place] [address]  Phone: [phone]; Fax: [phone]

## 2024-05-02 ENCOUNTER — Other Ambulatory Visit: Payer: Self-pay | Admitting: Cardiology

## 2024-05-17 ENCOUNTER — Encounter: Payer: Self-pay | Admitting: Cardiology

## 2024-05-17 ENCOUNTER — Ambulatory Visit: Attending: Cardiology | Admitting: Cardiology

## 2024-05-17 VITALS — BP 110/63 | HR 54 | Resp 16 | Ht 73.0 in | Wt 212.0 lb

## 2024-05-17 DIAGNOSIS — I5022 Chronic systolic (congestive) heart failure: Secondary | ICD-10-CM | POA: Insufficient documentation

## 2024-05-17 DIAGNOSIS — I447 Left bundle-branch block, unspecified: Secondary | ICD-10-CM | POA: Diagnosis not present

## 2024-05-17 DIAGNOSIS — I428 Other cardiomyopathies: Secondary | ICD-10-CM | POA: Diagnosis not present

## 2024-05-17 DIAGNOSIS — I25118 Atherosclerotic heart disease of native coronary artery with other forms of angina pectoris: Secondary | ICD-10-CM | POA: Diagnosis not present

## 2024-05-17 NOTE — Patient Instructions (Signed)

## 2024-05-17 NOTE — Progress Notes (Signed)
 Cardiology Office Note:  .   Date:  05/17/2024  ID:  Franklin Jones, DOB 1951/05/18, MRN [id number] PCP: Franklin Leta NOVAK, MD  [facility] Providers Cardiologist:  Franklin Bergamo, MD Electrophysiologist:  Franklin FORBES Furbish, MD   History of Present Illness: Franklin   [name] is a 73 y.o. Caucasian male with hypertension, stage III chronic kidney disease, hyperlipidemia, coronary artery disease, complete occlusion of proximal LAD to mid LAD and acute marginal of RCA by coronary angiography on 12/28/2017 at which time his EF was 20-25% and initially presented with florid CHF.  Past medical history also significant for prior history of smoking and asbestos exposure, moderate COPD and follows Franklin Chris, MD  He is S/P Mid LAD PCI in June 2019 with improvement in EF to 45%, CTO of RCA was left alone as he had extensive ipsilateral and contralateral collaterals. However recent echocardiogram in 11/11/2023 revealed EF 30 to 35%, and had developed new left bundle branch block, underwent cardiac catheterization on 11/23/2022 revealing widely patent LAD stent and no change in mid RCA CTO and hence felt his cardiomyopathy was nonischemic.  He was referred to pharmacy clinic for up titration of GDP from heart failure standpoint.  He now presents for 32-month follow-up.  Unfortunately in spite of GDB, repeat echocardiogram on 02/28/2024 revealing mildly dilated LV with LVEF at 25 to 30% with global hypokinesis.  Discussed the use of AI scribe software for clinical note transcription with the patient, who gave verbal consent to proceed.  History of Present Illness Franklin Jones is a 73 year old male with coronary artery disease and chronic systolic heart failure who presents for a follow-up visit.  He has coronary artery disease with a stent in the left anterior descending artery and a completely occluded right coronary artery. He experiences no chest pain or discomfort and maintains an active lifestyle, including  regular walking. His medication regimen includes carvedilol , Jardiance , Entresto , and spironolactone , with no reported issues.  He has left bundle branch block and was previously hesitant about pacemaker placement.  He takes rosuvastatin  for cholesterol management, with an LDL level of 56. He has no chest pain or discomfort and reports having plenty of energy.  Labs   Lab Results  Component Value Date   NA 138 02/14/2024   K 5.2 02/14/2024   CO2 23 02/14/2024   GLUCOSE 87 02/14/2024   BUN 23 02/14/2024   CREATININE 1.54 (H) 02/14/2024   CALCIUM  9.4 02/14/2024   EGFR 47 (L) 02/14/2024   GFRNONAA 46 (L) 04/03/2020      Latest Ref Rng & Units 02/14/2024   10:58 AM 02/06/2024   12:16 PM 01/25/2024   11:58 AM  BMP  Glucose 70 - 99 mg/dL 87  95  89   BUN 8 - 27 mg/dL 23  23  27    Creatinine 0.76 - 1.27 mg/dL 8.45  8.60  8.47   BUN/Creat Ratio 10 - 24 15  17  18    Sodium 134 - 144 mmol/L 138  137  137   Potassium 3.5 - 5.2 mmol/L 5.2  5.6  5.0   Chloride 96 - 106 mmol/L 105  105  103   CO2 20 - 29 mmol/L 23  21  21    Calcium  8.6 - 10.2 mg/dL 9.4  9.4  9.4    External Labs:  Care everywhere labs 08/22/2023:  Hb 14.5/HCT 44.7, platelets 200.  Normal indicis.  BUN 25, creatinine 1.3, EGFR 58 mL,  potassium 4.8.  Total cholesterol 178, triglycerides 87, HDL 44, LDL 56.  ROS  Review of Systems  Cardiovascular:  Negative for chest pain, dyspnea on exertion and leg swelling.   Physical Exam:   VS:  BP 110/63 (BP Location: Left Arm, Patient Position: Sitting, Cuff Size: Normal)   Pulse (!) 54   Resp 16   Ht 6' 1 (1.854 m)   Wt 212 lb (96.2 kg)   SpO2 93%   BMI 27.97 kg/m    Wt Readings from Last 3 Encounters:  05/17/24 212 lb (96.2 kg)  04/02/24 210 lb (95.3 kg)  02/06/24 211 lb 3.2 oz (95.8 kg)    Physical Exam Neck:     Vascular: No carotid bruit or JVD.  Cardiovascular:     Rate and Rhythm: Normal rate and regular rhythm.     Pulses: Intact distal pulses.      Heart sounds: Normal heart sounds. No murmur heard.    No gallop.  Pulmonary:     Effort: Pulmonary effort is normal.     Breath sounds: Normal breath sounds.  Abdominal:     General: Bowel sounds are normal.     Palpations: Abdomen is soft.  Musculoskeletal:     Right lower leg: No edema.     Left lower leg: No edema.    Studies Reviewed: Franklin    ECHOCARDIOGRAM COMPLETE 02/28/2024  1. Left ventricular ejection fraction, by estimation, is 25 to 30%. The left ventricle has severely decreased function. The left ventricle demonstrates global hypokinesis. The left ventricular internal cavity size was mildly dilated. Left ventricular diastolic parameters are consistent with Grade I diastolic dysfunction (impaired relaxation). Significant septal lateral dyschonry, noted on previous echo. Small apical aneurysm. 2. Right ventricular systolic function is normal. The right ventricular size is normal. 3. The mitral valve is normal in structure. No evidence of mitral valve regurgitation. No evidence of mitral stenosis. 4. The aortic valve is normal in structure. Aortic valve regurgitation is not visualized. Aortic valve sclerosis is present, with no evidence of aortic valve stenosis. 5. The inferior vena cava is normal in size with greater than 50% respiratory variability, suggesting right atrial pressure of 3 mmHg.  EKG:         Medications ordered    No orders of the defined types were placed in this encounter.    ASSESSMENT AND PLAN: .      ICD-10-CM   1. Coronary artery disease of native artery of native heart with stable angina pectoris (HCC)  I25.118     2. Chronic systolic CHF (congestive heart failure) (HCC)  I50.22     3. Non-ischemic cardiomyopathy (HCC)  I42.8     4. LBBB (left bundle branch block)  I44.7      Assessment & Plan Chronic systolic heart failure with reduced ejection fraction Chronic systolic heart failure with reduced ejection fraction, well-managed. Heart  function remains low, but he is asymptomatic and functional class I-II. He is on maximal tolerated guideline-directed medical therapy, including carvedilol , Jardiance , Entresto , and spironolactone . Pacemaker was discussed previously due to left bundle branch block, but he was reluctant to proceed. - Continue carvedilol  25 mg twice a day - Continue Jardiance  10 mg once a day - Continue Entresto  24/26 mg twice a day - Continue spironolactone  25 mg once a day  Coronary artery disease with prior LAD stent and chronic total occlusion of RCA Coronary artery disease with prior LAD stent and chronic total occlusion of RCA. The RCA  was not opened as it was not expected to improve heart function. He is not experiencing chest pain, and the decrease in heart function is attributed to the left bundle branch block. He is on optimal medical therapy for coronary artery disease. - Continue current medical therapy for coronary artery disease  LBBB Chronic and as stated patient not interested in CRT-P   Signed,  Franklin Bergamo, MD, [facility] 05/17/2024, 8:29 PM [facility] [address] [place], [place] [address] Phone: [phone]. Fax:  [phone]

## 2024-05-30 ENCOUNTER — Other Ambulatory Visit: Payer: Self-pay | Admitting: Cardiology

## 2024-05-30 DIAGNOSIS — I25110 Atherosclerotic heart disease of native coronary artery with unstable angina pectoris: Secondary | ICD-10-CM

## 2024-07-04 ENCOUNTER — Encounter: Payer: Self-pay | Admitting: Cardiology

## 2024-07-20 ENCOUNTER — Encounter (INDEPENDENT_AMBULATORY_CARE_PROVIDER_SITE_OTHER): Payer: Self-pay | Admitting: *Deleted

## 2024-08-01 ENCOUNTER — Encounter (INDEPENDENT_AMBULATORY_CARE_PROVIDER_SITE_OTHER): Payer: Self-pay | Admitting: Gastroenterology

## 2024-08-04 DIAGNOSIS — Z23 Encounter for immunization: Secondary | ICD-10-CM | POA: Diagnosis not present

## 2024-08-15 ENCOUNTER — Other Ambulatory Visit: Payer: Self-pay | Admitting: Cardiology

## 2024-08-15 DIAGNOSIS — I25110 Atherosclerotic heart disease of native coronary artery with unstable angina pectoris: Secondary | ICD-10-CM

## 2024-08-21 ENCOUNTER — Inpatient Hospital Stay
Admission: RE | Admit: 2024-08-21 | Discharge: 2024-08-21 | Payer: Self-pay | Attending: Emergency Medicine | Admitting: Emergency Medicine

## 2024-08-21 DIAGNOSIS — J849 Interstitial pulmonary disease, unspecified: Secondary | ICD-10-CM

## 2024-08-21 DIAGNOSIS — Z77090 Contact with and (suspected) exposure to asbestos: Secondary | ICD-10-CM

## 2024-08-28 DIAGNOSIS — E7800 Pure hypercholesterolemia, unspecified: Secondary | ICD-10-CM | POA: Diagnosis not present

## 2024-08-28 DIAGNOSIS — I10 Essential (primary) hypertension: Secondary | ICD-10-CM | POA: Diagnosis not present

## 2024-08-28 DIAGNOSIS — Z7189 Other specified counseling: Secondary | ICD-10-CM | POA: Diagnosis not present

## 2024-08-28 DIAGNOSIS — Z125 Encounter for screening for malignant neoplasm of prostate: Secondary | ICD-10-CM | POA: Diagnosis not present

## 2024-08-28 DIAGNOSIS — Z1339 Encounter for screening examination for other mental health and behavioral disorders: Secondary | ICD-10-CM | POA: Diagnosis not present

## 2024-08-28 DIAGNOSIS — Z1331 Encounter for screening for depression: Secondary | ICD-10-CM | POA: Diagnosis not present

## 2024-08-28 DIAGNOSIS — Z0000 Encounter for general adult medical examination without abnormal findings: Secondary | ICD-10-CM | POA: Diagnosis not present

## 2024-08-28 DIAGNOSIS — R5383 Other fatigue: Secondary | ICD-10-CM | POA: Diagnosis not present

## 2024-08-28 DIAGNOSIS — Z299 Encounter for prophylactic measures, unspecified: Secondary | ICD-10-CM | POA: Diagnosis not present

## 2024-08-28 DIAGNOSIS — Z79899 Other long term (current) drug therapy: Secondary | ICD-10-CM | POA: Diagnosis not present

## 2024-09-06 ENCOUNTER — Encounter: Payer: Self-pay | Admitting: Emergency Medicine

## 2024-09-06 ENCOUNTER — Ambulatory Visit: Payer: Self-pay

## 2024-09-06 ENCOUNTER — Ambulatory Visit (INDEPENDENT_AMBULATORY_CARE_PROVIDER_SITE_OTHER): Payer: Self-pay | Admitting: Emergency Medicine

## 2024-09-06 VITALS — BP 128/64 | HR 61 | Temp 97.4°F | Ht 73.5 in | Wt 214.8 lb

## 2024-09-06 DIAGNOSIS — J849 Interstitial pulmonary disease, unspecified: Secondary | ICD-10-CM

## 2024-09-06 DIAGNOSIS — J209 Acute bronchitis, unspecified: Secondary | ICD-10-CM

## 2024-09-06 DIAGNOSIS — Z77090 Contact with and (suspected) exposure to asbestos: Secondary | ICD-10-CM | POA: Diagnosis not present

## 2024-09-06 DIAGNOSIS — J45909 Unspecified asthma, uncomplicated: Secondary | ICD-10-CM

## 2024-09-06 LAB — PULMONARY FUNCTION TEST
DL/VA % pred: 54 %
DL/VA: 2.14 ml/min/mmHg/L
DLCO unc % pred: 52 %
DLCO unc: 14.67 ml/min/mmHg
FEF 25-75 Post: 1.97 L/s
FEF 25-75 Pre: 1.73 L/s
FEF2575-%Change-Post: 13 %
FEF2575-%Pred-Post: 75 %
FEF2575-%Pred-Pre: 66 %
FEV1-%Change-Post: 3 %
FEV1-%Pred-Post: 82 %
FEV1-%Pred-Pre: 80 %
FEV1-Post: 2.95 L
FEV1-Pre: 2.85 L
FEV1FVC-%Change-Post: -1 %
FEV1FVC-%Pred-Pre: 93 %
FEV6-%Change-Post: 5 %
FEV6-%Pred-Post: 95 %
FEV6-%Pred-Pre: 90 %
FEV6-Post: 4.41 L
FEV6-Pre: 4.18 L
FEV6FVC-%Change-Post: 0 %
FEV6FVC-%Pred-Post: 105 %
FEV6FVC-%Pred-Pre: 105 %
FVC-%Change-Post: 5 %
FVC-%Pred-Post: 90 %
FVC-%Pred-Pre: 86 %
FVC-Post: 4.43 L
FVC-Pre: 4.21 L
Post FEV1/FVC ratio: 67 %
Post FEV6/FVC ratio: 99 %
Pre FEV1/FVC ratio: 68 %
Pre FEV6/FVC Ratio: 99 %
RV % pred: 137 %
RV: 3.74 L
TLC % pred: 105 %
TLC: 8.15 L

## 2024-09-06 MED ORDER — AZITHROMYCIN 250 MG PO TABS: ORAL_TABLET | ORAL | 0 refills | Status: AC

## 2024-09-06 NOTE — Patient Instructions (Signed)
 Full pft performed today

## 2024-09-06 NOTE — Assessment & Plan Note (Signed)
 PFT stable with mild obstruction.  Plan to continue albuterol  as needed.  Hold off on starting scheduled BD therapy for now.  If he develops more persistent symptoms then would retry scheduled medication.

## 2024-09-06 NOTE — Assessment & Plan Note (Signed)
 CT chest is stable, somewhat improved compared with 06/2023.  Will continue to follow serial imaging given his asbestos exposure and interstitial changes.  Given his overall stability we can probably hold off 2 years to do a repeat scan.  Similarly his pulmonary function testing is stable and we can consider the timing of repeat physiology at his next visit

## 2024-09-06 NOTE — Progress Notes (Signed)
 Full pft performed today

## 2024-09-06 NOTE — Progress Notes (Signed)
 HPI:  ROV 09/06/2024 --this follow-up visit for Franklin Jones.  He is 2 with a history of prior tobacco use and occupational asbestos exposure.  He has associated mild to moderate COPD.  We have been following him clinically and also with scans of his chest and pulmonary function testing. Today he reports that overall he has been stable with good functional capacity. Over the last week he has had URI sx, cough, no real fever, low energy. He does feel that he is getting a bit better. He never uses his albuterol .   PFT done 09/06/2024 and reviewed by me showed mild obstruction with an FEV1 of 2.85 L (80% predicted), no bronchodilator response, normal lung volumes with some borderline hyperinflation, decreased diffusion capacity that corrects to the normal range when adjusted for alveolar volume.  There is some slight curve of his flow-volume loop.  The FEV1 is overall stable going back 7 to 8 years.  CT scan of the chest done 11//25 and reviewed by me shows no mediastinal or axillary lymphadenopathy.  Some upper lung predominant coarse and peribronchovascular nodularity and ground glass overall improved compared with September 2024.  There are some scattered tiny juxta- pleural nodules that are unchanged.  No overt interstitial lung disease    Vitals:   09/06/24 1443  BP: 128/64  Pulse: 61  Temp: (!) 97.4 F (36.3 C)  SpO2: 96%  Weight: 214 lb 12.8 oz (97.4 kg)  Height: 6' 1.5 (1.867 m)   Gen: Pleasant, well-nourished, in no distress,  normal affect  ENT: No lesions,  mouth clear,  oropharynx clear, no postnasal drip  Neck: No JVD, no stridor  Lungs: No use of accessory muscles, clear B   Cardiovascular: RRR, heart sounds normal, no murmur or gallops, no peripheral edema  Musculoskeletal: No deformities, no cyanosis or clubbing  Neuro: alert, non focal  Skin: Warm, no lesions or rashes  PULMONARY FUNCTON TEST 04/09/2010 04/28/2011 05/04/2012 06/06/2013  FVC 4.98 4.51 4.42 4.27  FEV1 3.68  3.2 3.24 3.1  FEV1/FVC 73.9 71 73.3 72.6  FVC  % Predicted 92 87 85 80  FEV % Predicted 90 88 90 78  FeF 25-75 2.66 2.01 2.32 4.09  FeF 25-75 % Predicted 3.32 3.29 3.25 5.04     ASSESSMENT/PLAN:  Acute bronchitis Acute symptoms with purulent sputum for over a week.  Will plan to treat with azithromycin .  I think we can hold off on steroids as he does not have any significant wheezing on exam today.  He will call or message us  if he is not improving.  ILD (interstitial lung disease) (HCC) CT chest is stable, somewhat improved compared with 06/2023.  Will continue to follow serial imaging given his asbestos exposure and interstitial changes.  Given his overall stability we can probably hold off 2 years to do a repeat scan.  Similarly his pulmonary function testing is stable and we can consider the timing of repeat physiology at his next visit  Asthma PFT stable with mild obstruction.  Plan to continue albuterol  as needed.  Hold off on starting scheduled BD therapy for now.  If he develops more persistent symptoms then would retry scheduled medication.     Lamar Chris, MD, PhD 09/06/2024, 4:50 PM [facility] [phone] or if no answer [phone]

## 2024-09-06 NOTE — Patient Instructions (Addendum)
 It is good to see you today. Please take azithromycin as directed until completely gone. Please call or message us  if your upper respiratory infection and bronchitis are not improving. We reviewed your pulmonary function testing today.  These are overall stable compared with your priors.  Good news. We reviewed your CT scan of the chest today.  Again this is stable compared with your priors actually improved compared with 2024.  Good news. We will follow-up in 1 year.  At that time we can decide the timing of your next CT scan and pulmonary function testing, can probably do these at the 2-year mark.

## 2024-09-06 NOTE — Assessment & Plan Note (Signed)
 Acute symptoms with purulent sputum for over a week.  Will plan to treat with azithromycin.  I think we can hold off on steroids as he does not have any significant wheezing on exam today.  He will call or message us  if he is not improving.

## 2024-09-11 ENCOUNTER — Encounter: Payer: Self-pay | Admitting: Emergency Medicine

## 2024-09-12 ENCOUNTER — Other Ambulatory Visit: Payer: Self-pay | Admitting: *Deleted

## 2024-09-12 MED ORDER — ALBUTEROL SULFATE HFA 108 (90 BASE) MCG/ACT IN AERS: 2.0000 | INHALATION_SPRAY | RESPIRATORY_TRACT | 3 refills | Status: AC | PRN

## 2024-09-18 DIAGNOSIS — Z1212 Encounter for screening for malignant neoplasm of rectum: Secondary | ICD-10-CM | POA: Diagnosis not present

## 2024-09-18 DIAGNOSIS — Z1211 Encounter for screening for malignant neoplasm of colon: Secondary | ICD-10-CM | POA: Diagnosis not present

## 2024-09-26 ENCOUNTER — Telehealth: Payer: Self-pay | Admitting: Pharmacy Technician

## 2024-09-26 ENCOUNTER — Encounter: Payer: Self-pay | Admitting: Cardiology

## 2024-09-26 NOTE — Telephone Encounter (Signed)
 Patient Advocate Encounter   The patient was approved for a [employer] grant that will help cover the cost of jardiance /entresto  Total amount awarded, [id number].  Effective: 08/27/24 - 08/26/25   APW:[id number] ERW:[id number] Group:[id number] PI:[id number] [employer] ID: [id number]   Pharmacy provided with approval and processing information. Patient informed via mychart

## 2024-09-27 ENCOUNTER — Encounter: Payer: Self-pay | Admitting: Cardiology

## 2024-10-01 ENCOUNTER — Encounter (HOSPITAL_BASED_OUTPATIENT_CLINIC_OR_DEPARTMENT_OTHER): Payer: Self-pay

## 2024-10-13 ENCOUNTER — Other Ambulatory Visit: Payer: Self-pay | Admitting: Cardiology
